# Patient Record
Sex: Female | Born: 1960 | Race: White | Hispanic: No | Marital: Single | Smoking: Never smoker
Health system: Southern US, Community
[De-identification: ages and names within clinical notes are randomized; demographics above are authoritative.]

## PROBLEM LIST (undated history)

## (undated) DIAGNOSIS — Z87442 Personal history of urinary calculi: Secondary | ICD-10-CM

## (undated) DIAGNOSIS — D649 Anemia, unspecified: Secondary | ICD-10-CM

## (undated) DIAGNOSIS — M5481 Occipital neuralgia: Secondary | ICD-10-CM

## (undated) DIAGNOSIS — M1990 Unspecified osteoarthritis, unspecified site: Secondary | ICD-10-CM

## (undated) DIAGNOSIS — Z8719 Personal history of other diseases of the digestive system: Secondary | ICD-10-CM

## (undated) DIAGNOSIS — T7840XA Allergy, unspecified, initial encounter: Secondary | ICD-10-CM

## (undated) DIAGNOSIS — F32A Depression, unspecified: Secondary | ICD-10-CM

## (undated) DIAGNOSIS — Z86718 Personal history of other venous thrombosis and embolism: Secondary | ICD-10-CM

## (undated) DIAGNOSIS — F419 Anxiety disorder, unspecified: Secondary | ICD-10-CM

## (undated) HISTORY — DX: Occipital neuralgia: M54.81

## (undated) HISTORY — DX: Depression, unspecified: F32.A

## (undated) HISTORY — DX: Anxiety disorder, unspecified: F41.9

## (undated) HISTORY — DX: Allergy, unspecified, initial encounter: T78.40XA

## (undated) HISTORY — PX: HERNIA REPAIR: SHX51

---

## 1999-02-15 HISTORY — PX: ESOPHAGOGASTRODUODENOSCOPY: SHX1529

## 2011-02-15 DIAGNOSIS — Z86718 Personal history of other venous thrombosis and embolism: Secondary | ICD-10-CM

## 2011-02-15 HISTORY — DX: Personal history of other venous thrombosis and embolism: Z86.718

## 2011-10-30 DIAGNOSIS — I609 Nontraumatic subarachnoid hemorrhage, unspecified: Secondary | ICD-10-CM | POA: Insufficient documentation

## 2011-10-30 DIAGNOSIS — Z8679 Personal history of other diseases of the circulatory system: Secondary | ICD-10-CM | POA: Insufficient documentation

## 2011-10-30 DIAGNOSIS — G08 Intracranial and intraspinal phlebitis and thrombophlebitis: Secondary | ICD-10-CM | POA: Insufficient documentation

## 2014-01-17 ENCOUNTER — Ambulatory Visit: Payer: Self-pay | Admitting: Cardiovascular Disease

## 2014-01-27 ENCOUNTER — Ambulatory Visit: Payer: Self-pay | Admitting: Cardiovascular Disease

## 2016-10-26 ENCOUNTER — Encounter: Payer: Self-pay | Admitting: *Deleted

## 2016-10-26 ENCOUNTER — Emergency Department
Admission: EM | Admit: 2016-10-26 | Discharge: 2016-10-26 | Disposition: A | Discharge status: Other Acute Inpatient Hospital | Payer: Self-pay | Attending: Emergency Medicine | Admitting: Emergency Medicine

## 2016-10-26 ENCOUNTER — Emergency Department: Payer: Self-pay

## 2016-10-26 DIAGNOSIS — G08 Intracranial and intraspinal phlebitis and thrombophlebitis: Secondary | ICD-10-CM | POA: Insufficient documentation

## 2016-10-26 HISTORY — DX: Personal history of other venous thrombosis and embolism: Z86.718

## 2016-10-26 LAB — HEPATIC FUNCTION PANEL
ALT: 19 U/L (ref 14–54)
AST: 24 U/L (ref 15–41)
Albumin: 4 g/dL (ref 3.5–5.0)
Alkaline Phosphatase: 75 U/L (ref 38–126)
TOTAL PROTEIN: 7.7 g/dL (ref 6.5–8.1)
Total Bilirubin: 0.9 mg/dL (ref 0.3–1.2)

## 2016-10-26 LAB — CBC WITH DIFFERENTIAL/PLATELET
BASOS ABS: 0 10*3/uL (ref 0–0.1)
BASOS PCT: 0 %
Eosinophils Absolute: 0.1 10*3/uL (ref 0–0.7)
Eosinophils Relative: 1 %
HEMATOCRIT: 29.6 % — AB (ref 35.0–47.0)
HEMOGLOBIN: 8.9 g/dL — AB (ref 12.0–16.0)
Lymphocytes Relative: 11 %
Lymphs Abs: 1.3 10*3/uL (ref 1.0–3.6)
MCH: 20.1 pg — ABNORMAL LOW (ref 26.0–34.0)
MCHC: 30 g/dL — ABNORMAL LOW (ref 32.0–36.0)
MCV: 66.9 fL — ABNORMAL LOW (ref 80.0–100.0)
Monocytes Absolute: 0.4 10*3/uL (ref 0.2–0.9)
Monocytes Relative: 3 %
NEUTROS ABS: 10.1 10*3/uL — AB (ref 1.4–6.5)
NEUTROS PCT: 85 %
Platelets: 375 10*3/uL (ref 150–440)
RBC: 4.43 MIL/uL (ref 3.80–5.20)
RDW: 20.8 % — AB (ref 11.5–14.5)
WBC: 12 10*3/uL — AB (ref 3.6–11.0)

## 2016-10-26 LAB — BASIC METABOLIC PANEL
Anion gap: 14 (ref 5–15)
BUN: 8 mg/dL (ref 6–20)
CALCIUM: 9.3 mg/dL (ref 8.9–10.3)
CO2: 22 mmol/L (ref 22–32)
Chloride: 104 mmol/L (ref 101–111)
Creatinine, Ser: 0.58 mg/dL (ref 0.44–1.00)
GFR calc Af Amer: >60 mL/min (ref >60)
GLUCOSE: 132 mg/dL — AB (ref 65–99)
Potassium: 4.1 mmol/L (ref 3.5–5.1)
SODIUM: 140 mmol/L (ref 135–145)

## 2016-10-26 LAB — APTT: APTT: 28 s (ref 24–36)

## 2016-10-26 LAB — PROTIME-INR
INR: 1.05
Prothrombin Time: 13.6 seconds (ref 11.4–15.2)

## 2016-10-26 MED ORDER — PROCHLORPERAZINE EDISYLATE 5 MG/ML IJ SOLN
10.0000 mg | INTRAMUSCULAR | Status: AC
  Administered 2016-10-26: 10 mg via INTRAVENOUS

## 2016-10-26 MED ORDER — HEPARIN (PORCINE) IN NACL 100-0.45 UNIT/ML-% IJ SOLN: 18.0000 [IU]/kg/h | Freq: Once | INTRAMUSCULAR | Status: DC

## 2016-10-26 MED ORDER — MORPHINE SULFATE (PF) 4 MG/ML IV SOLN
INTRAVENOUS | Status: AC
  Administered 2016-10-26: 4 mg via INTRAVENOUS
  Filled 2016-10-26: qty 1

## 2016-10-26 MED ORDER — ONDANSETRON HCL 4 MG/2ML IJ SOLN
INTRAMUSCULAR | Status: AC
  Administered 2016-10-26: 4 mg via INTRAVENOUS
  Filled 2016-10-26: qty 2

## 2016-10-26 MED ORDER — PROCHLORPERAZINE EDISYLATE 5 MG/ML IJ SOLN
INTRAMUSCULAR | Status: AC
  Administered 2016-10-26: 10 mg via INTRAVENOUS
  Filled 2016-10-26: qty 2

## 2016-10-26 MED ORDER — HEPARIN BOLUS VIA INFUSION
4000.0000 [IU] | Freq: Once | INTRAVENOUS | Status: AC
  Administered 2016-10-26: 4000 [IU] via INTRAVENOUS
  Filled 2016-10-26: qty 4000

## 2016-10-26 MED ORDER — HEPARIN SODIUM (PORCINE) 5000 UNIT/ML IJ SOLN
4000.0000 [IU] | Freq: Once | INTRAMUSCULAR | Status: DC
Start: 2016-10-26 — End: 2016-10-26

## 2016-10-26 MED ORDER — DIPHENHYDRAMINE HCL 50 MG/ML IJ SOLN
INTRAMUSCULAR | Status: AC
  Administered 2016-10-26: 12.5 mg via INTRAVENOUS
  Filled 2016-10-26: qty 1

## 2016-10-26 MED ORDER — MORPHINE SULFATE (PF) 4 MG/ML IV SOLN
4.0000 mg | Freq: Once | INTRAVENOUS | Status: AC
  Administered 2016-10-26: 4 mg via INTRAVENOUS
  Filled 2016-10-26: qty 1

## 2016-10-26 MED ORDER — HEPARIN (PORCINE) IN NACL 100-0.45 UNIT/ML-% IJ SOLN
1350.0000 [IU]/h | INTRAMUSCULAR | Status: DC
  Administered 2016-10-26: 1350 [IU]/h via INTRAVENOUS
  Filled 2016-10-26 (×2): qty 250

## 2016-10-26 MED ORDER — MORPHINE SULFATE (PF) 4 MG/ML IV SOLN
4.0000 mg | Freq: Once | INTRAVENOUS | Status: AC
  Administered 2016-10-26: 4 mg via INTRAVENOUS

## 2016-10-26 MED ORDER — IOPAMIDOL (ISOVUE-300) INJECTION 61%
75.0000 mL | Freq: Once | INTRAVENOUS | Status: AC | PRN
  Administered 2016-10-26: 75 mL via INTRAVENOUS
  Filled 2016-10-26: qty 75

## 2016-10-26 MED ORDER — ONDANSETRON HCL 4 MG/2ML IJ SOLN
4.0000 mg | INTRAMUSCULAR | Status: AC
  Administered 2016-10-26: 4 mg via INTRAVENOUS

## 2016-10-26 MED ORDER — DIPHENHYDRAMINE HCL 50 MG/ML IJ SOLN
12.5000 mg | INTRAMUSCULAR | Status: AC
  Administered 2016-10-26: 12.5 mg via INTRAVENOUS

## 2016-10-26 NOTE — Progress Notes (Signed)
ANTICOAGULATION CONSULT NOTE - Initial Consult  Pharmacy Consult for heparin dosing  Indication: VTE  Allergies  Allergen Reactions  . Codeine   . Shellfish Allergy     Patient Measurements: Height: 5\' 4"  (162.6 cm) Weight: 200 lb (90.7 kg) IBW/kg (Calculated) : 54.7 Heparin Dosing Weight: 75kg   Vital Signs: Temp: 97.9 F (36.6 C) (09/12 1718) Temp Source: Oral (09/12 1718) BP: 156/77 (09/12 1718) Pulse Rate: 78 (09/12 1718)  Labs:  Recent Labs  10/26/16 1721  HGB 8.9*  HCT 29.6*  PLT 375  CREATININE 0.58    Estimated Creatinine Clearance: 87.7 mL/min (by C-G formula based on SCr of 0.58 mg/dL).   Medical History: Past Medical History:  Diagnosis Date  . H/O cerebral venous sinus thrombosis     Assessment: Pharmacy consulted for heparin dosing and monitoring for VTE treatment in a 56 yo female   Goal of Therapy:  Heparin level 0.3-0.7 units/ml Monitor platelets by anticoagulation protocol: Yes   Plan:  Baseline labs ordered Patient dosing weight: 75kg  Give 4000 units bolus x 1 Start heparin infusion at 1350 units/hr Check anti-Xa level in 6 hours and daily while on heparin Continue to monitor H&H and platelets  Gardner CandleSheema M Janari Yamada, PharmD, BCPS Clinical Pharmacist 10/26/2016 7:43 PM

## 2016-10-26 NOTE — ED Notes (Signed)
Pt reports nausea has subsided and is requesting a Sprite. MD verbalized pt could have fluids. Sprite provided. Pt having no difficulty drinking at this time.

## 2016-10-26 NOTE — ED Notes (Signed)
Dr. Forbach at bedside.  

## 2016-10-26 NOTE — ED Triage Notes (Addendum)
States hx of sinus thrombosis, states headache, facial pressure and ear pressure, denies any tingling or numbness, states nausea, pt pale, awake and alert in no acute distress, states symptoms began sometime this AM

## 2016-10-26 NOTE — ED Notes (Signed)
Heparin stopped for transport d/t [facility].

## 2016-10-26 NOTE — ED Notes (Signed)
EMTALA checked for completion  

## 2016-10-26 NOTE — ED Provider Notes (Addendum)
[facility] Emergency Department Provider Note  ____________________________________________   First MD Initiated Contact with Patient 10/26/16 1810     (approximate)  I have reviewed the triage vital signs and the nursing notes.   HISTORY  Chief Complaint Facial Pain and Headache    HPI Jasmine [name] is a 56 y.o. female With a history of extensive venous sinus thromboses in 2013 who was on warfarin for 3 years who presents for evaluation of acute onset severe headache and facial pain and pressure starting today with nausea and photosensitivity similar to her prior symptoms.  She states that when this happened the first time her symptoms felt the same and she was transferred to [facility] and stayed for 11 days in the hospital.  They never establish why exactly she developed all the blood clots in her head but she improved on warfarin and was anticoagulated for 3 years.  She has been off of anticoagulation for about 2 years.  She has been in her usual state of health until this morning when she awoke with headache that is gotten gradually worse over the course of the morning.  Nothing in particular makes the patient's symptoms better nor worse.    The pain is severe and global throughout her head.  She denies any numbness, weakness, or tingling in any of her extremities.  She has not vomited and denies any recent fever.  Light makes the symptoms worse but she has had no visual changes.  She denies neck pain, neck stiffness, chest pain, shortness of breath, and abdominal pain.  She has had no recent traumatic injuries.  Of note, the patient was told to continue taking a daily aspirin when she had her last neurology follow-up visit in 2016, but she reports to me that she stopped taking it about 6 months ago because she did not feel she needed it anymore.  Past Medical History:  Diagnosis Date  . H/O cerebral venous sinus thrombosis     There are no active problems to display  for this patient.   History reviewed. No pertinent surgical history.  Prior to Admission medications   Not on File    Allergies Codeine and Shellfish allergy  No family history on file.  Social History Social History  Substance Use Topics  . Smoking status: Never Smoker  . Smokeless tobacco: Never Used  . Alcohol use No    Review of Systems Constitutional: No fever/chills Eyes: No visual changes.  +Photophobia ENT: No sore throat. Cardiovascular: Denies chest pain. Respiratory: Denies shortness of breath. Gastrointestinal: No abdominal pain.  No nausea, no vomiting.  No diarrhea.  No constipation. Genitourinary: Negative for dysuria. Musculoskeletal: Negative for neck pain.  Negative for back pain. Integumentary: Negative for rash. Neurological: Severe generalized headache and facial pain/pressure.  No focal numbness/weakness   ____________________________________________   PHYSICAL EXAM:  VITAL SIGNS: ED Triage Vitals  Enc Vitals Group     BP 10/26/16 1718 (!) 156/77     Pulse Rate 10/26/16 1718 78     Resp 10/26/16 1718 18     Temp 10/26/16 1718 97.9 F (36.6 C)     Temp Source 10/26/16 1718 Oral     SpO2 10/26/16 1718 100 %     Weight 10/26/16 1717 90.7 kg (200 lb)     Height 10/26/16 1717 1.626 m ( )     Head Circumference --      Peak Flow --      Pain Score 10/26/16  1717 8     Pain Loc --      Pain Edu? --      Excl. in GC? --     Constitutional: Alert and oriented. Appears uncomfortable Eyes: Conjunctivae are normal. PERRL. EOMI.   Head: Atraumatic. Nose: No congestion/rhinnorhea. Mouth/Throat: Mucous membranes are moist. Neck: No stridor.  No meningeal signs.   Cardiovascular: Normal rate, regular rhythm. Good peripheral circulation. Grossly normal heart sounds. Respiratory: Normal respiratory effort.  No retractions. Lungs CTAB. Gastrointestinal: Soft and nontender. No distention.  Musculoskeletal: No lower extremity tenderness nor  edema. No gross deformities of extremities. Neurologic:  Normal speech and language. No gross focal neurologic deficits are appreciated.  Skin:  Skin is warm, dry and intact. No rash noted. Psychiatric: Mood and affect are normal. Speech and behavior are normal.  ____________________________________________   LABS (all labs ordered are listed, but only abnormal results are displayed)  Labs Reviewed  BASIC METABOLIC PANEL - Abnormal; Notable for the following:       Result Value   Glucose, Bld 132 (*)    All other components within normal limits  HEPATIC FUNCTION PANEL - Abnormal; Notable for the following:    Bilirubin, Direct <0.1 (*)    All other components within normal limits  CBC WITH DIFFERENTIAL/PLATELET - Abnormal; Notable for the following:    WBC 12.0 (*)    Hemoglobin 8.9 (*)    HCT 29.6 (*)    MCV 66.9 (*)    MCH 20.1 (*)    MCHC 30.0 (*)    RDW 20.8 (*)    Neutro Abs 10.1 (*)    All other components within normal limits  PROTIME-INR  APTT  HEPARIN LEVEL (UNFRACTIONATED)   ____________________________________________  EKG  None - EKG not ordered by ED physician ____________________________________________  RADIOLOGY  Marylou Mccoy, personally discussed these images and results by phone with the on-call radiologist and used this discussion as part of my medical decision making.    Ct Head W & Wo Contrast  Addendum Date: 10/26/2016   ADDENDUM REPORT: 10/26/2016 19:29 ADDENDUM: Critical Value/emergent results were called by telephone at the time of interpretation on 10/26/2016 at 1925 hours to Dr. Loleta Rose , who verbally acknowledged these results. Electronically Signed   By: Odessa Fleming M.D.   On: 10/26/2016 19:29   Result Date: 10/26/2016 CLINICAL DATA:  56 year old female with history of severe dural venous sinus thrombosis in 2013 diagnosed and treated at [facility]. By report, residual clot remained in the right transverse and sigmoid sinuses on a 2014  follow-up. Stopped Coumadin 1-2 years ago. Acute onset headache symptoms and nausea which she reports are very similar to her 2013 presentation. EXAM: CT HEAD WITHOUT CONTRAST CT VENOGRAM WITH CONTRAST TECHNIQUE: Contiguous axial images were obtained from the base of the skull through the vertex without and with intravenous contrast CONTRAST:  75mL ISOVUE-300 IOPAMIDOL (ISOVUE-300) INJECTION 61% COMPARISON:  Report of [facility] Brain MRI and MRV 05/07/2012 and 10/30/2011 FINDINGS: CT HEAD Brain: Cerebral volume is normal. No midline shift, ventriculomegaly, mass effect, evidence of mass lesion, intracranial hemorrhage or evidence of cortically based acute infarction. Gray-white matter differentiation is within normal limits throughout the brain. Vascular: Abnormal vascular hyperdensity at the torcula and tracking along the left transverse and sigmoid sinuses. See below. Skull: Negative.  No acute osseous abnormality identified. Sinuses/Orbits: Clear. Other: Visualized orbits and scalp soft tissues are within normal limits. CTV HEAD Venous sinuses: Low-density filling defect within the torcula, and the medial  aspect of both transverse sinuses (series 4, image 28), although the right transverse and sigmoid sinuses appear to remain patent. Low-density thrombus throughout the left transverse and left sigmoid sinuses to the left IJ bulb, although the superior left IJ in the neck appears patent on series 4, image 1. The superior sagittal sinus, straight sinus, inferior sagittal sinus, vein of Galen, internal cerebral veins, and basal veins of Rosenthal remain patent. There is also preserved enhancement in the major cortical draining veins including the veins of Trolard. Delayed phase: No abnormal parenchymal enhancement identified. Review of the MIP images confirms the above findings IMPRESSION: 1. Positive for dural venous sinus thrombosis involving the torcula, left transverse sinus, left sigmoid sinus, and left IJ bulb. 2.  The remaining venous sinuses appear patent. 3. No acute intracranial hemorrhage. No intracranial mass effect. No CT evidence of infarct at this time. Electronically Signed: By: Odessa FlemingH  Hall M.D. On: 10/26/2016 19:24    ____________________________________________   PROCEDURES  Critical Care performed: Yes, see critical care procedure note(s)   Procedure(s) performed:   .Critical Care Performed by: Loleta RoseFORBACH, Mechille Varghese Authorized by: Loleta RoseFORBACH, Elzada Pytel   Critical care provider statement:    Critical care time (minutes):  45   Critical care time was exclusive of:  Separately billable procedures and treating other patients   Critical care was necessary to treat or prevent imminent or life-threatening deterioration of the following conditions:  CNS failure or compromise   Critical care was time spent personally by me on the following activities:  Development of treatment plan with patient or surrogate, discussions with consultants, evaluation of patient's response to treatment, examination of patient, obtaining history from patient or surrogate, ordering and performing treatments and interventions, ordering and review of laboratory studies, ordering and review of radiographic studies, pulse oximetry, re-evaluation of patient's condition and review of old charts      ____________________________________________   INITIAL IMPRESSION / ASSESSMENT AND PLAN / ED COURSE  Pertinent labs & imaging results that were available during my care of the patient were reviewed by me and considered in my medical decision making (see chart for details).     Clinical Course as of Oct 27 2307  Wed Oct 26, 2016  1828 I reviewed the patient's history from 2013 and the medical record and she did have extremely extensive venous sinus thromboses throughout most, if not all, of her sinuses.  additionally, they do not know why she developed these because all of her hypercoagulability studies were normal.  She was  anticoagulated on warfarin for 3 years and then taken off.  A follow-up MRI/MRA in 2014 showed marked improvement with only a slight bit of residual, non-occlusive thrombosis in a limited portion of the sinuses.  Given the acute onset of symptoms that feels just like her prior symptoms,  I called and spoke with phone with Dr. Margo AyeHall, a [facility]adi[facility].  We discussed the appropriate imaging study and he recommended CT head with and without IV contrast, with the IV phase being the CTV protocol. he is going to call and speak with the CT technologist to let them know exactly how to perform the study.  I will provide morphine and Zofran to the patient for comfort in the interim and once we receive the results we can decide if she needs transfer or additional treatment here in this emergency department.  [CF]  1926 Dr. Margo AyeHall called and let me know that the patient definitely has a recurrence of acute venous sinus  thrombosis.  I will inform the patient, begin anticoagulation with IV heparin, and contact [facility] for transfer.  [CF]  1930 CT HEAD W & WO CONTRAST [CF]  1935 Informed patient of the results, she confirmed that [facility] is her preference if possible.  Contacting transfer center.  [CF]  2056 I had a phone conversation with Dr. Modesto Charon (possibly spelled Renaldo Reel).  he had reviewed the patient's chart prior to our discussion.  I did not have an opportunity to pass along much of the information about the patient's acute symptoms and visit today.  He stated that he felt it would be very unlikely for the patient to have a recurrence of the venous sinus thrombosis and recommended against anticoagulation until she has the MR studies.  He felt that the most appropriate care would be if we transfer her to the [facility] emergency Department for further evaluation and consultation by the neurology service.  He did acknowledge that as she is currently my patient I should treat her or not treat her as I see fit.  Given that  Dr. Margo Aye, the neuroradiologist who interpreted the CT scans, told me on the phone that he has no doubt that this is acute, and that her symptoms feel the same to her now as they did an 2013 when she first developed venous sinus thromboses, I feel it is much better to treat her empirically rather than risk delay of care and delay of treatment.  Again, given her presentation and the findings from the CT scans which Dr. Margo Aye felt were equivocally acute, I still feel that she is best served by transfer to [facility] for further evaluation and treatment.  I updated the patient with the neurologist's recommendations and explained why I am choosing to proceed with treatment.  She understands and agrees.  We are now arranging transportation.  [CF]  2203 Reassessed patient.  She is less nauseated after the Compazine.  She has tolerated some liquids.  No obvious side effects from heparin.  Still awaiting transportation.  [CF]    Clinical Course User Index [CF] Loleta Rose, MD    ____________________________________________  FINAL CLINICAL IMPRESSION(S) / ED DIAGNOSES  Final diagnoses:  Acute cerebral venous sinus thrombosis     MEDICATIONS GIVEN DURING THIS VISIT:  Medications  heparin bolus via infusion 4,000 Units (4,000 Units Intravenous Bolus from Bag 10/26/16 1953)    Followed by  heparin ADULT infusion 100 units/mL (25000 units/234mL sodium chloride 0.45%) (0 Units/hr Intravenous Stopped 10/26/16 2222)  morphine 4 MG/ML injection 4 mg (4 mg Intravenous Given 10/26/16 1844)  ondansetron (ZOFRAN) injection 4 mg (4 mg Intravenous Given 10/26/16 1844)  iopamidol (ISOVUE-300) 61 % injection 75 mL (75 mLs Intravenous Contrast Given 10/26/16 1851)  morphine 4 MG/ML injection 4 mg (4 mg Intravenous Given 10/26/16 1953)  prochlorperazine (COMPAZINE) injection 10 mg (10 mg Intravenous Given 10/26/16 2030)  diphenhydrAMINE (BENADRYL) injection 12.5 mg (12.5 mg Intravenous Given 10/26/16 2030)     NEW OUTPATIENT  MEDICATIONS STARTED DURING THIS VISIT:  There are no discharge medications for this patient.   There are no discharge medications for this patient.   There are no discharge medications for this patient.    Note:  This document was prepared using Dragon voice recognition software and may include unintentional dictation errors.    Loleta Rose, MD 10/26/16 2101    Loleta Rose, MD 10/26/16 [phone]

## 2016-10-26 NOTE — ED Notes (Signed)
Pt ambulatory to and from toilet by self.  

## 2016-10-26 NOTE — ED Notes (Signed)
Pt ambulatory to toilet

## 2018-06-30 ENCOUNTER — Emergency Department: Payer: Self-pay

## 2018-06-30 ENCOUNTER — Encounter: Payer: Self-pay | Admitting: Internal Medicine

## 2018-06-30 ENCOUNTER — Other Ambulatory Visit: Payer: Self-pay

## 2018-06-30 ENCOUNTER — Inpatient Hospital Stay
Admission: EM | Admit: 2018-06-30 | Discharge: 2018-07-04 | DRG: 378 | Disposition: A | Discharge status: Home / Self Care | Payer: Self-pay | Attending: Internal Medicine | Admitting: Internal Medicine

## 2018-06-30 DIAGNOSIS — R109 Unspecified abdominal pain: Secondary | ICD-10-CM

## 2018-06-30 DIAGNOSIS — K922 Gastrointestinal hemorrhage, unspecified: Principal | ICD-10-CM | POA: Diagnosis present

## 2018-06-30 DIAGNOSIS — D649 Anemia, unspecified: Secondary | ICD-10-CM | POA: Diagnosis present

## 2018-06-30 DIAGNOSIS — R791 Abnormal coagulation profile: Secondary | ICD-10-CM | POA: Diagnosis present

## 2018-06-30 DIAGNOSIS — Z91013 Allergy to seafood: Secondary | ICD-10-CM

## 2018-06-30 DIAGNOSIS — K449 Diaphragmatic hernia without obstruction or gangrene: Secondary | ICD-10-CM | POA: Diagnosis present

## 2018-06-30 DIAGNOSIS — Z1159 Encounter for screening for other viral diseases: Secondary | ICD-10-CM

## 2018-06-30 DIAGNOSIS — E669 Obesity, unspecified: Secondary | ICD-10-CM | POA: Diagnosis present

## 2018-06-30 DIAGNOSIS — Z885 Allergy status to narcotic agent status: Secondary | ICD-10-CM

## 2018-06-30 DIAGNOSIS — Z7901 Long term (current) use of anticoagulants: Secondary | ICD-10-CM

## 2018-06-30 DIAGNOSIS — D62 Acute posthemorrhagic anemia: Secondary | ICD-10-CM | POA: Diagnosis present

## 2018-06-30 DIAGNOSIS — D509 Iron deficiency anemia, unspecified: Secondary | ICD-10-CM | POA: Diagnosis present

## 2018-06-30 DIAGNOSIS — K219 Gastro-esophageal reflux disease without esophagitis: Secondary | ICD-10-CM | POA: Diagnosis present

## 2018-06-30 DIAGNOSIS — E539 Vitamin B deficiency, unspecified: Secondary | ICD-10-CM | POA: Diagnosis present

## 2018-06-30 LAB — URINALYSIS, COMPLETE (UACMP) WITH MICROSCOPIC
Bacteria, UA: NONE SEEN
Bilirubin Urine: NEGATIVE
Glucose, UA: NEGATIVE mg/dL
Hgb urine dipstick: NEGATIVE
Ketones, ur: 5 mg/dL — AB
Leukocytes,Ua: NEGATIVE
Nitrite: NEGATIVE
Protein, ur: 100 mg/dL — AB
Specific Gravity, Urine: 1.017 (ref 1.005–1.030)
pH: 7 (ref 5.0–8.0)

## 2018-06-30 LAB — COMPREHENSIVE METABOLIC PANEL
ALT: 12 U/L (ref 0–44)
AST: 17 U/L (ref 15–41)
Albumin: 4.2 g/dL (ref 3.5–5.0)
Alkaline Phosphatase: 71 U/L (ref 38–126)
Anion gap: 10 (ref 5–15)
BUN: 11 mg/dL (ref 6–20)
CO2: 25 mmol/L (ref 22–32)
Calcium: 9 mg/dL (ref 8.9–10.3)
Chloride: 108 mmol/L (ref 98–111)
Creatinine, Ser: 0.6 mg/dL (ref 0.44–1.00)
GFR calc Af Amer: >60 mL/min (ref >60)
GFR calc non Af Amer: >60 mL/min (ref >60)
Glucose, Bld: 131 mg/dL — ABNORMAL HIGH (ref 70–99)
Potassium: 3.6 mmol/L (ref 3.5–5.1)
Sodium: 143 mmol/L (ref 135–145)
Total Bilirubin: 1.1 mg/dL (ref 0.3–1.2)
Total Protein: 7.5 g/dL (ref 6.5–8.1)

## 2018-06-30 LAB — RETICULOCYTES
Immature Retic Fract: 26.5 % — ABNORMAL HIGH (ref 2.3–15.9)
RBC.: 3.7 MIL/uL — ABNORMAL LOW (ref 3.87–5.11)
Retic Count, Absolute: 86.6 10*3/uL (ref 19.0–186.0)
Retic Ct Pct: 2.3 % (ref 0.4–3.1)

## 2018-06-30 LAB — TROPONIN I: Troponin I: <0.03 ng/mL (ref <0.03)

## 2018-06-30 LAB — CBC
HCT: 25.3 % — ABNORMAL LOW (ref 36.0–46.0)
Hemoglobin: 6.5 g/dL — ABNORMAL LOW (ref 12.0–15.0)
MCH: 17.7 pg — ABNORMAL LOW (ref 26.0–34.0)
MCHC: 25.7 g/dL — ABNORMAL LOW (ref 30.0–36.0)
MCV: 68.9 fL — ABNORMAL LOW (ref 80.0–100.0)
Platelets: 369 10*3/uL (ref 150–400)
RBC: 3.67 MIL/uL — ABNORMAL LOW (ref 3.87–5.11)
RDW: 21.5 % — ABNORMAL HIGH (ref 11.5–15.5)
WBC: 9.8 10*3/uL (ref 4.0–10.5)
nRBC: 0 % (ref 0.0–0.2)

## 2018-06-30 LAB — PROTIME-INR
INR: 2.1 — ABNORMAL HIGH (ref 0.8–1.2)
Prothrombin Time: 23.5 seconds — ABNORMAL HIGH (ref 11.4–15.2)

## 2018-06-30 LAB — APTT: aPTT: 36 seconds (ref 24–36)

## 2018-06-30 LAB — ABO/RH: ABO/RH(D): A POS

## 2018-06-30 LAB — PREPARE RBC (CROSSMATCH)

## 2018-06-30 LAB — LIPASE, BLOOD: Lipase: 41 U/L (ref 11–51)

## 2018-06-30 LAB — HEMOGLOBIN AND HEMATOCRIT, BLOOD
HCT: 27.8 % — ABNORMAL LOW (ref 36.0–46.0)
Hemoglobin: 7.4 g/dL — ABNORMAL LOW (ref 12.0–15.0)

## 2018-06-30 LAB — SARS CORONAVIRUS 2 BY RT PCR (HOSPITAL ORDER, PERFORMED IN ~~LOC~~ HOSPITAL LAB): SARS Coronavirus 2: NEGATIVE

## 2018-06-30 MED ORDER — ONDANSETRON HCL 4 MG/2ML IJ SOLN: 4.0000 mg | Freq: Four times a day (QID) | INTRAMUSCULAR | Status: DC | PRN

## 2018-06-30 MED ORDER — ACETAMINOPHEN 325 MG PO TABS: 650.0000 mg | ORAL_TABLET | Freq: Four times a day (QID) | ORAL | Status: DC | PRN

## 2018-06-30 MED ORDER — PANTOPRAZOLE SODIUM 40 MG IV SOLR
40.0000 mg | Freq: Two times a day (BID) | INTRAVENOUS | Status: DC
  Administered 2018-06-30 – 2018-07-02 (×4): 40 mg via INTRAVENOUS
  Filled 2018-06-30 (×4): qty 40

## 2018-06-30 MED ORDER — SODIUM CHLORIDE 0.9% IV SOLUTION
Freq: Once | INTRAVENOUS | Status: AC
  Administered 2018-06-30: 250 mL via INTRAVENOUS
  Filled 2018-06-30: qty 250

## 2018-06-30 MED ORDER — HYDRALAZINE HCL 20 MG/ML IJ SOLN
10.0000 mg | INTRAMUSCULAR | Status: DC | PRN
  Filled 2018-06-30: qty 1

## 2018-06-30 MED ORDER — ONDANSETRON HCL 4 MG PO TABS: 4.0000 mg | ORAL_TABLET | Freq: Four times a day (QID) | ORAL | Status: DC | PRN

## 2018-06-30 MED ORDER — SODIUM CHLORIDE 0.9 % IV SOLN
INTRAVENOUS | Status: DC
  Administered 2018-06-30: 23:00:00 via INTRAVENOUS

## 2018-06-30 MED ORDER — SODIUM CHLORIDE 0.9% FLUSH: 3.0000 mL | Freq: Once | INTRAVENOUS | Status: DC

## 2018-06-30 MED ORDER — ACETAMINOPHEN 650 MG RE SUPP: 650.0000 mg | Freq: Four times a day (QID) | RECTAL | Status: DC | PRN

## 2018-06-30 NOTE — ED Notes (Signed)
Patient transported to Ultrasound 

## 2018-06-30 NOTE — ED Notes (Signed)
ED TO INPATIENT HANDOFF REPORT  ED Nurse Name and Phone #: Shanda Bumps 83  S Name/Age/Gender [name] 58 y.o. female Room/Bed: ED09A/ED09A  Code Status   Code Status: Not on file  Home/SNF/Other Home Patient oriented to: self, place, time and situation Is this baseline? Yes   Triage Complete: Triage complete  Chief Complaint vomiting  Triage Note Pt comes from home via EMS with upper middle abdominal pain radiating to her left shoulder. Pt has hx of gallbladder attacks and describes it as such saying that normally she can make them go away but hasn't been able to today. Pt vomitted with EMS. Pt given  Zofran IM. Pt on blood thinner for hx of stroke.    Allergies Allergies  Allergen Reactions  . Codeine   . Shellfish Allergy     Level of Care/Admitting Diagnosis ED Disposition    ED Disposition Condition Comment   Admit  Hospital Area: [facility] [[id number]]  Level of Care: Med-Surg [16]  Covid Evaluation: N/A  Diagnosis: Anemia [161096]  Admitting Physician: Enid Baas [[id number]]  Attending Physician: Enid Baas [[id number]]  Estimated length of stay: past midnight tomorrow  Certification:: I certify this patient will need inpatient services for at least 2 midnights  PT Class (Do Not Modify): Inpatient [101]  PT Acc Code (Do Not Modify): Private [1]       B Medical/Surgery History Past Medical History:  Diagnosis Date  . H/O cerebral venous sinus thrombosis 2013   No past surgical history on file.   A IV Location/Drains/Wounds Patient Lines/Drains/Airways Status   Active Line/Drains/Airways    Name:   Placement date:   Placement time:   Site:   Days:   Peripheral IV 10/26/16 Right Antecubital   10/26/16    1843    Antecubital   612   Peripheral IV 10/26/16 Left Antecubital   10/26/16    1948    Antecubital   612   Peripheral IV 06/30/18 Left Antecubital   06/30/18    1602    Antecubital   less than 1   Peripheral IV  06/30/18 Right Wrist   06/30/18    1940    Wrist   less than 1          Intake/Output Last 24 hours No intake or output data in the 24 hours ending 06/30/18 2021  Labs/Imaging Results for orders placed or performed during the hospital encounter of 06/30/18 (from the past 48 hour(s))  Lipase, blood     Status: None   Collection Time: 06/30/18  4:00 PM  Result Value Ref Range   Lipase 41 11 - 51 U/L    Comment: Performed at [facility], [address] [place], [place] [address]  Comprehensive metabolic panel     Status: Abnormal   Collection Time: 06/30/18  4:00 PM  Result Value Ref Range   Sodium 143 135 - 145 mmol/L   Potassium 3.6 3.5 - 5.1 mmol/L   Chloride 108 98 - 111 mmol/L   CO2 25 22 - 32 mmol/L   Glucose, Bld 131 (H) 70 - 99 mg/dL   BUN 11 6 - 20 mg/dL   Creatinine, Ser 6.21 0.44 - 1.00 mg/dL   Calcium 9.0 8.9 - 30.8 mg/dL   Total Protein 7.5 6.5 - 8.1 g/dL   Albumin 4.2 3.5 - 5.0 g/dL   AST 17 15 - 41 U/L   ALT 12 0 - 44 U/L   Alkaline Phosphatase 71 38 -  126 U/L   Total Bilirubin 1.1 0.3 - 1.2 mg/dL   GFR calc non Af Amer >60 >60 mL/min   GFR calc Af Amer >60 >60 mL/min   Anion gap 10 5 - 15    Comment: Performed at [facility], [address] [place], [place] [address]  CBC     Status: Abnormal   Collection Time: 06/30/18  4:00 PM  Result Value Ref Range   WBC 9.8 4.0 - 10.5 K/uL   RBC 3.67 (L) 3.87 - 5.11 MIL/uL   Hemoglobin 6.5 (L) 12.0 - 15.0 g/dL    Comment: Reticulocyte Hemoglobin testing may be clinically indicated, consider ordering this additional test [id number]    HCT 25.3 (L) 36.0 - 46.0 %   MCV 68.9 (L) 80.0 - 100.0 fL   MCH 17.7 (L) 26.0 - 34.0 pg   MCHC 25.7 (L) 30.0 - 36.0 g/dL   RDW 11.9 (H) 14.7 - 82.9 %   Platelets 369 150 - 400 K/uL   nRBC 0.0 0.0 - 0.2 %    Comment: Performed at [facility] [address] [place], [place] [address]  Reticulocytes     Status: Abnormal   Collection Time: 06/30/18   4:00 PM  Result Value Ref Range   Retic Ct Pct 2.3 0.4 - 3.1 %   RBC. 3.70 (L) 3.87 - 5.11 MIL/uL   Retic Count, Absolute 86.6 19.0 - 186.0 K/uL   Immature Retic Fract 26.5 (H) 2.3 - 15.9 %    Comment: Performed at [facility], [address], [place], [place] [address]  ABO/Rh     Status: None   Collection Time: 06/30/18  4:00 PM  Result Value Ref Range   ABO/RH(D)      A POS Performed at [facility], [address] [place], [place] [address]   Troponin I - Add-On to previous collection     Status: None   Collection Time: 06/30/18  4:24 PM  Result Value Ref Range   Troponin I <0.03 <0.03 ng/mL    Comment: Performed at [facility], [address] [place], [place] [address]  Urinalysis, Complete w Microscopic     Status: Abnormal   Collection Time: 06/30/18  4:49 PM  Result Value Ref Range   Color, Urine YELLOW (A) YELLOW   APPearance CLEAR (A) CLEAR   Specific Gravity, Urine 1.017 1.005 - 1.030   pH 7.0 5.0 - 8.0   Glucose, UA NEGATIVE NEGATIVE mg/dL   Hgb urine dipstick NEGATIVE NEGATIVE   Bilirubin Urine NEGATIVE NEGATIVE   Ketones, ur 5 (A) NEGATIVE mg/dL   Protein, ur 413 (A) NEGATIVE mg/dL   Nitrite NEGATIVE NEGATIVE   Leukocytes,Ua NEGATIVE NEGATIVE   RBC / HPF 0-5 0 - 5 RBC/hpf   WBC, UA 0-5 0 - 5 WBC/hpf   Bacteria, UA NONE SEEN NONE SEEN   Squamous Epithelial / LPF 6-10 0 - 5   Mucus PRESENT     Comment: Performed at [facility], [address] [place], [place] [address]  Type and screen [facility]     Status: None (Preliminary result)   Collection Time: 06/30/18  4:49 PM  Result Value Ref Range   ABO/RH(D) A POS    Antibody Screen NEG    Sample Expiration 07/03/2018,2359    Unit Number [id number]    Blood Component Type RED CELLS,LR    Unit division 00    Status of Unit ISSUED  Transfusion Status OK TO TRANSFUSE    Crossmatch Result      Compatible Performed at [facility], [address] [place], [place] [address]   Protime-INR     Status: Abnormal   Collection Time: 06/30/18  4:49 PM  Result Value Ref Range   Prothrombin Time 23.5 (H) 11.4 - 15.2 seconds   INR 2.1 (H) 0.8 - 1.2    Comment: (NOTE) INR goal varies based on device and disease states. Performed at [facility], [address] [place], [place] [address]   APTT     Status: None   Collection Time: 06/30/18  4:49 PM  Result Value Ref Range   aPTT 36 24 - 36 seconds    Comment: Performed at [facility], [address] [place], [place] [address]  SARS Coronavirus 2 (CEPHEID - Performed in [facility] hospital lab), Hosp Order     Status: None   Collection Time: 06/30/18  7:06 PM  Result Value Ref Range   SARS Coronavirus 2 NEGATIVE NEGATIVE    Comment: (NOTE) If result is NEGATIVE SARS-CoV-2 target nucleic acids are NOT DETECTED. The SARS-CoV-2 RNA is generally detectable in upper and lower  respiratory specimens during the acute phase of infection. The lowest  concentration of SARS-CoV-2 viral copies this assay can detect is 250  copies / mL. A negative result does not preclude SARS-CoV-2 infection  and should not be used as the sole basis for treatment or other  patient management decisions.  A negative result may occur with  improper specimen collection / handling, submission of specimen other  than nasopharyngeal swab, presence of viral mutation(s) within the  areas targeted by this assay, and inadequate number of viral copies  (<250 copies / mL). A negative result must be combined with clinical  observations, patient history, and epidemiological information. If result is POSITIVE SARS-CoV-2 target nucleic acids are DETECTED. The SARS-CoV-2 RNA is generally detectable in upper and lower  respiratory specimens dur ing the acute phase of infection.  Positive  results are indicative of active infection with SARS-CoV-2.  Clinical  correlation with patient  history and other diagnostic information is  necessary to determine patient infection status.  Positive results do  not rule out bacterial infection or co-infection with other viruses. If result is PRESUMPTIVE POSTIVE SARS-CoV-2 nucleic acids MAY BE PRESENT.   A presumptive positive result was obtained on the submitted specimen  and confirmed on repeat testing.  While 2019 novel coronavirus  (SARS-CoV-2) nucleic acids may be present in the submitted sample  additional confirmatory testing may be necessary for epidemiological  and / or clinical management purposes  to differentiate between  SARS-CoV-2 and other Sarbecovirus currently known to infect humans.  If clinically indicated additional testing with an alternate test  methodology [phone]) is advised. The SARS-CoV-2 RNA is generally  detectable in upper and lower respiratory sp ecimens during the acute  phase of infection. The expected result is Negative. Fact Sheet for Patients:  BoilerBrush.com.cy Fact Sheet for Healthcare Providers: https://pope.com/ This test is not yet approved or cleared by the [place] FDA and has been authorized for detection and/or diagnosis of SARS-CoV-2 by FDA under an Emergency Use Authorization (EUA).  This EUA will remain in effect (meaning this test can be used) for the duration of the COVID-19 declaration under Section 564(b)(1) of the Act, 21 U.S.C. section 360bbb-3(b)(1), unless the authorization is terminated or revoked sooner. Performed at [facility], [address] [place], [place] [address]   Prepare RBC     Status: None   Collection Time: 06/30/18  7:30 PM  Result Value Ref Range   Order Confirmation      ORDER PROCESSED BY BLOOD BANK Performed at [facility], [address] [place], [place] [address]    [place] Abdomen Limited Ruq  Result Date: 06/30/2018 CLINICAL DATA:  Acute onset of RIGHT UPPER QUADRANT  abdominal pain associated with nausea that began approximately 2 hours prior to emergency department arrival. EXAM: ULTRASOUND ABDOMEN LIMITED RIGHT UPPER QUADRANT COMPARISON:  None. FINDINGS: Gallbladder: No shadowing gallstones or echogenic sludge. No gallbladder wall thickening or pericholecystic fluid. Negative sonographic Murphy sign according to the ultrasound technologist. Common bile duct: Diameter: Approximately 3 mm. Liver: Normal size and echotexture without focal parenchymal abnormality. Portal vein is patent on color Doppler imaging with normal direction of blood flow towards the liver. IMPRESSION: Normal examination. Electronically Signed   By: Hulan Saashomas  Lawrence M.D.   On: 06/30/2018 17:25    Pending Labs Unresulted Labs (From admission, onward)    Start     Ordered   06/30/18 2016  Vitamin B12  (Anemia Panel (PNL))  Once,   STAT     06/30/18 2015   06/30/18 2016  Folate  (Anemia Panel (PNL))  Once,   STAT     06/30/18 2015   06/30/18 2016  Iron and TIBC  (Anemia Panel (PNL))  Once,   STAT     06/30/18 2015   06/30/18 2016  Ferritin  (Anemia Panel (PNL))  Once,   STAT     06/30/18 2015   Signed and Held  SARS Coronavirus 2 (CEPHEID - Performed in [facility]), [facility] Order  Once,   R    Comments:  No isolation needed for this testing (if isolation ordered for another indication, maintain current isolation).   Question:  Pre-procedural testing  Answer:  Yes   Signed and Held   Signed and Held  HIV antibody (Routine Testing)  Once,   R     Signed and Held   Signed and Held  Basic metabolic panel  Tomorrow morning,   R     Signed and Held   Signed and Held  CBC  Tomorrow morning,   R     Signed and Held   Signed and Held  Protime-INR  Tomorrow morning,   R     Signed and Held          Vitals/Pain Today's Vitals   06/30/18 1909 06/30/18 1926 06/30/18 1942 06/30/18 1945  BP: (!) 174/67 (!) 174/67 (!) 162/72 (!) 162/72  Pulse: 76 73 78 80  Resp: 13 15 18 15    Temp:  98.1 F (36.7 C) 98 F (36.7 C)   TempSrc:  Oral Oral   SpO2: 100% 98% 99% 100%  Weight:      Height:      PainSc:        Isolation Precautions No active isolations  Medications Medications  sodium chloride flush (NS) 0.9 % injection 3 mL (has no administration in time range)  0.9 %  sodium chloride infusion (Manually program via Guardrails IV Fluids) (250 mLs Intravenous New Bag/Given 06/30/18 1936)    Mobility walks Low fall risk   Focused Assessments Cardiac Assessment Handoff:  Cardiac Rhythm: Normal sinus rhythm Lab Results  Component Value Date   TROPONINI <0.03 06/30/2018   No results found for: DDIMER Does the Patient currently have  chest pain? No     R Recommendations: See Admitting Provider Note  Report given to:   Additional Notes:

## 2018-06-30 NOTE — ED Notes (Signed)
Admitting at bedside 

## 2018-06-30 NOTE — ED Provider Notes (Signed)
-----------------------------------------   6:19 PM on 06/30/2018 -----------------------------------------  Patient with a history of chronic anemia patient seen and evaluated during the coronavirus epidemic during a time with low staffing, on blood thinners because of a history of thrombosis, has a long history she states of epigastric abdominal discomfort associated with food which gets better with burping.  Happen again today although today she threw up instead of burping.  She now feels 100% better when I saw her her belly was completely benign, I talked to her about a CT scan and further evaluation and she refuses she states she feels "great" and she is embarrassed to have been here.  She states this is the same thing that "always happens to her".  She has not seen any other doctors for this.  The onset was several hours prior to coming in and when she finally vomited nonbloody nonbilious vomiting x1, she states all of her symptoms resolved and she has no ongoing complaints.  She wants to eat and leave.  I talked her at length about her anemia, she has not been symptomatic with that she has had no melena no bright red blood per rectum, she has not had her blood checked in at least 2 years.  This appears to be chronic, we will check rectal exam to ensure that she is not bleeding.  Her abdomen is completely benign and she is asymptomatic.  We did talk about admission to the hospital but she refuses we talked about imaging and she refuses she states she feels "great" and wants to go home.  Her belly is benign.  There is little to suggest ACS PE or dissection, there is little to suggest SBO, we will ensure that she can tolerate p.o. prior to departure and her belly is again nontender with no guarding and no rebound.  Extensive return precautions and follow-up given and understood.  Work including troponin are reassuring.  Patient declines to wait for serial enzymes.   Jeanmarie Plant, MD 06/30/18 [phone]

## 2018-06-30 NOTE — ED Triage Notes (Signed)
Pt comes from home via EMS with upper middle abdominal pain radiating to her left shoulder. Pt has hx of gallbladder attacks and describes it as such saying that normally she can make them go away but hasn't been able to today. Pt vomitted with EMS. Pt given 4mg  Zofran IM. Pt on blood thinner for hx of stroke.

## 2018-06-30 NOTE — Plan of Care (Signed)
  Problem: Education: Goal: Knowledge of General Education information will improve Description: Including pain rating scale, medication(s)/side effects and non-pharmacologic comfort measures Outcome: Progressing   Problem: Clinical Measurements: Goal: Diagnostic test results will improve Outcome: Progressing   

## 2018-06-30 NOTE — ED Provider Notes (Signed)
[facility] Emergency Department Provider Note  ____________________________________________  Time seen: Approximately 4:24 PM  I have reviewed the triage vital signs and the nursing notes.   HISTORY  Chief Complaint Nausea and Abdominal Pain    HPI Jasmine [name] is a 58 y.o. female that presents to the emergency department for evaluation of mid abdominal pressure since this morning.  Patient states that she was sitting down to eat a meal when she developed abdominal pressure.  Pain radiated to her left shoulder.  She had an episode of bilious vomiting when she arrived in the emergency department.  After vomiting in the emergency department, symptoms mostly resolved.  She has had similar abdominal pain previously, which usually resolves with burping.  She had a small amount of diarrhea this morning.  She has never had any abdominal surgery.  No recent URI.  Patient drinks a couple alcoholic beverages per day.  Patient takes Xarelto.  No fever, chills, shortness of breath, nausea.   Past Medical History:  Diagnosis Date  . H/O cerebral venous sinus thrombosis 2013    Patient Active Problem List   Diagnosis Date Noted  . Anemia 06/30/2018    No past surgical history on file.  Prior to Admission medications   Medication Sig Start Date End Date Taking? Authorizing Provider  rivaroxaban (XARELTO) 20 MG TABS tablet Take 20 mg by mouth every evening.    Yes [provider]    Allergies Codeine and Shellfish allergy  Family History  Problem Relation Age of Onset  . Breast cancer Mother   . Bone cancer Mother     Social History Social History   Tobacco Use  . Smoking status: Never Smoker  . Smokeless tobacco: Never Used  Substance Use Topics  . Alcohol use: No  . Drug use: Not on file     Review of Systems  Constitutional: No fever/chills Cardiovascular: No chest pain. Respiratory: No SOB. Gastrointestinal: Positive for abdominal  pressure.  No nausea.  Positive for vomiting x1. Musculoskeletal: Negative for musculoskeletal pain. Skin: Negative for rash, abrasions, lacerations, ecchymosis. Neurological: Negative for headaches, numbness or tingling   ____________________________________________   PHYSICAL EXAM:  VITAL SIGNS: ED Triage Vitals  Enc Vitals Group     BP 06/30/18 1601 (!) 160/74     Pulse Rate 06/30/18 1601 68     Resp 06/30/18 1601 (!) 22     Temp 06/30/18 1601 98 F (36.7 C)     Temp Source 06/30/18 1601 Oral     SpO2 06/30/18 1601 100 %     Weight 06/30/18 1558 200 lb (90.7 kg)     Height 06/30/18 1558 5\' 3"  (1.6 m)     Head Circumference --      Peak Flow --      Pain Score 06/30/18 1558 8     Pain Loc --      Pain Edu? --      Excl. in GC? --      Constitutional: Alert and oriented. Well appearing and in no acute distress. Eyes: Conjunctivae are normal. PERRL. EOMI. Head: Atraumatic. ENT:      Ears:      Nose: No congestion/rhinnorhea.      Mouth/Throat: Mucous membranes are moist.  Neck: No stridor.   Cardiovascular: Normal rate, regular rhythm.  Good peripheral circulation. Respiratory: Normal respiratory effort without tachypnea or retractions. Lungs CTAB. Good air entry to the bases with no decreased or absent breath sounds. Gastrointestinal: Bowel sounds  4 quadrants. Soft and nontender to palpation. No guarding or rigidity. No palpable masses. No distention.  Musculoskeletal: Full range of motion to all extremities. No gross deformities appreciated. Neurologic:  Normal speech and language. No gross focal neurologic deficits are appreciated.  Skin:  Skin is warm, dry and intact. No rash noted. Psychiatric: Mood and affect are normal. Speech and behavior are normal. Patient exhibits appropriate insight and judgement.   ____________________________________________   LABS (all labs ordered are listed, but only abnormal results are displayed)  Labs Reviewed  COMPREHENSIVE  METABOLIC PANEL - Abnormal; Notable for the following components:      Result Value   Glucose, Bld 131 (*)    All other components within normal limits  CBC - Abnormal; Notable for the following components:   RBC 3.67 (*)    Hemoglobin 6.5 (*)    HCT 25.3 (*)    MCV 68.9 (*)    MCH 17.7 (*)    MCHC 25.7 (*)    RDW 21.5 (*)    All other components within normal limits  URINALYSIS, COMPLETE (UACMP) WITH MICROSCOPIC - Abnormal; Notable for the following components:   Color, Urine YELLOW (*)    APPearance CLEAR (*)    Ketones, ur 5 (*)    Protein, ur 100 (*)    All other components within normal limits  PROTIME-INR - Abnormal; Notable for the following components:   Prothrombin Time 23.5 (*)    INR 2.1 (*)    All other components within normal limits  RETICULOCYTES - Abnormal; Notable for the following components:   RBC. 3.70 (*)    Immature Retic Fract 26.5 (*)    All other components within normal limits  SARS CORONAVIRUS 2 (HOSPITAL ORDER, PERFORMED IN [facility])  LIPASE, BLOOD  TROPONIN I  APTT  VITAMIN B12  FOLATE  IRON AND TIBC  FERRITIN  TYPE AND SCREEN  PREPARE RBC (CROSSMATCH)  ABO/RH   ____________________________________________  EKG   ____________________________________________  RADIOLOGY Lexine Baton, personally viewed and evaluated these images (plain radiographs) as part of my medical decision making, as well as reviewing the written report by the radiologist.  [place] Abdomen Limited Ruq  Result Date: 06/30/2018 CLINICAL DATA:  Acute onset of RIGHT UPPER QUADRANT abdominal pain associated with nausea that began approximately 2 hours prior to emergency department arrival. EXAM: ULTRASOUND ABDOMEN LIMITED RIGHT UPPER QUADRANT COMPARISON:  None. FINDINGS: Gallbladder: No shadowing gallstones or echogenic sludge. No gallbladder wall thickening or pericholecystic fluid. Negative sonographic Murphy sign according to the ultrasound technologist.  Common bile duct: Diameter: Approximately 3 mm. Liver: Normal size and echotexture without focal parenchymal abnormality. Portal vein is patent on color Doppler imaging with normal direction of blood flow towards the liver. IMPRESSION: Normal examination. Electronically Signed   By: Hulan Saas M.D.   On: 06/30/2018 17:25    ____________________________________________    PROCEDURES  Procedure(s) performed:    Procedures    Medications  sodium chloride flush (NS) 0.9 % injection 3 mL (has no administration in time range)  0.9 %  sodium chloride infusion (Manually program via Guardrails IV Fluids) ( Intravenous Transfusing/Transfer 06/30/18 2051)     ____________________________________________   INITIAL IMPRESSION / ASSESSMENT AND PLAN / ED COURSE  Pertinent labs & imaging results that were available during my care of the patient were reviewed by me and considered in my medical decision making (see chart for details).  Review of the [place] CSRS was performed in accordance of  the [place] prior to dispensing any controlled drugs.     Patient presented the emergency department for evaluation of an episode of abdominal pain today.  Abdominal pain completely resolved when patient had gone to the emergency department.  Incidentally hemoglobin and hematocrit were low at 6.5 and 25.3 which is lower than previous levels 1 year ago.  PT elevated at 23.5 and INR 2.1.  Hemoccult was positive.  Patient will be admitted for GI bleed to the hospitalist service.  Patient denies any symptoms currently. Patient is given ED precautions to return to the ED for any worsening or new symptoms.     ____________________________________________  FINAL CLINICAL IMPRESSION(S) / ED DIAGNOSES  Final diagnoses:  Abdominal pain  Gastrointestinal hemorrhage, unspecified gastrointestinal hemorrhage type      NEW MEDICATIONS STARTED DURING THIS VISIT:  ED Discharge Orders    None          This  chart was dictated using voice recognition software/Dragon. Despite best efforts to proofread, errors can occur which can change the meaning. Any change was purely unintentional.    Enid Derry, PA-C 06/30/18 2057    Jeanmarie Plant, MD 06/30/18 [phone]

## 2018-06-30 NOTE — H&P (Signed)
[employer] [facility] - [place] at [facility]   PATIENT NAME: Jasmine [name]    MR#:  [id number]  DATE OF BIRTH:  1961/01/12  DATE OF ADMISSION:  06/30/2018  PRIMARY CARE PHYSICIAN: Patient, No Pcp Per   REQUESTING/REFERRING PHYSICIAN: Dr. Ileana Roup  CHIEF COMPLAINT:   Chief Complaint  Patient presents with  . Nausea  . Abdominal Pain    HISTORY OF PRESENT ILLNESS:  Jasmine [name]  is a 58 y.o. 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 She has occasional complaints of biliary colic which resolved by themselves.  But it was more in the epigastric region this morning after eating breakfast.  Denies any history of heartburn.  She has known history of anemia with her last hemoglobin was 9.0 in September 2018.  Denies any active bleeding.  No dark stools or black tarry stools.  Denies taking any BC powders or NSAIDs over-the-counter.  Her pain resolved with GI cocktail in the emergency room.  However lab work indicated hemoglobin of 6.5 with MCV of 68.9.  Her INR was 2.1.  She is being admitted for anemia.  PAST MEDICAL HISTORY:   Past Medical History:  Diagnosis Date  . H/O cerebral venous sinus thrombosis 2013    PAST SURGICAL HISTORY:  No past surgical history on file.  SOCIAL HISTORY:   Social History   Tobacco Use  . Smoking status: Never Smoker  . Smokeless tobacco: Never Used  Substance Use Topics  . Alcohol use: No    FAMILY HISTORY:   Family History  Problem Relation Age of Onset  . Breast cancer Mother   . Bone cancer Mother     DRUG ALLERGIES:   Allergies  Allergen Reactions  . Codeine   . Shellfish Allergy     REVIEW OF SYSTEMS:   Review of Systems  Constitutional: Negative for chills, fever, malaise/fatigue and weight loss.  HENT: Negative for ear  discharge, ear pain, hearing loss, nosebleeds and tinnitus.   Eyes: Negative for blurred vision, double vision and photophobia.  Respiratory: Negative for cough, hemoptysis, shortness of breath and wheezing.   Cardiovascular: Negative for chest pain, palpitations, orthopnea and leg swelling.  Gastrointestinal: Positive for abdominal pain and nausea. Negative for constipation, diarrhea, heartburn, melena and vomiting.  Genitourinary: Negative for dysuria, frequency, hematuria and urgency.  Musculoskeletal: Negative for back pain, myalgias and neck pain.  Skin: Negative for rash.  Neurological: Positive for dizziness. Negative for tingling, tremors, sensory change, speech change, focal weakness and headaches.  Endo/Heme/Allergies: Does not bruise/bleed easily.  Psychiatric/Behavioral: Negative for depression.    MEDICATIONS AT HOME:   Prior to Admission medications   Medication Sig Start Date End Date Taking? Authorizing Provider  rivaroxaban (XARELTO) 20 MG TABS tablet Take 20 mg by mouth every evening.    Yes [provider]      VITAL SIGNS:  Blood pressure (!) 162/72, pulse 80, temperature 98 F (36.7 C), temperature source Oral, resp. rate 15, height 5\' 3"  (1.6 m), weight 90.7 kg, SpO2 100 %.  PHYSICAL EXAMINATION:  Physical Exam  GENERAL:  58 y.o.-year-old obese patient lying in the bed with no acute distress.  EYES: Pupils equal, round, reactive to light and accommodation. No scleral icterus. Extraocular muscles intact.  HEENT: Head atraumatic, normocephalic. Oropharynx and nasopharynx clear.  NECK:  Supple,  no jugular venous distention. No thyroid enlargement, no tenderness.  LUNGS: Normal breath sounds bilaterally, no wheezing, rales,rhonchi or crepitation. No use of accessory muscles of respiration.  CARDIOVASCULAR: S1, S2 normal. No murmurs, rubs, or gallops.  [name]: Soft, nontender, nondistended. Bowel sounds present. No organomegaly or mass.  EXTREMITIES: No  pedal edema, cyanosis, or clubbing.  NEUROLOGIC: Cranial nerves II through XII are intact. Muscle strength 5/5 in all extremities. Sensation intact. Gait not checked.  PSYCHIATRIC: The patient is alert and oriented x 3.  SKIN: No obvious rash, lesion, or ulcer.   LABORATORY PANEL:   CBC Recent Labs  Lab 06/30/18 1600  WBC 9.8  HGB 6.5*  HCT 25.3*  PLT 369   ------------------------------------------------------------------------------------------------------------------  Chemistries  Recent Labs  Lab 06/30/18 1600  NA 143  K 3.6  CL 108  CO2 25  GLUCOSE 131*  BUN 11  CREATININE 0.60  CALCIUM 9.0  AST 17  ALT 12  ALKPHOS 71  BILITOT 1.1   ------------------------------------------------------------------------------------------------------------------  Cardiac Enzymes Recent Labs  Lab 06/30/18 1624  TROPONINI <0.03   ------------------------------------------------------------------------------------------------------------------  RADIOLOGY:  Jasmine [name] Limited Ruq  Result Date: 06/30/2018 CLINICAL DATA:  Acute onset of RIGHT UPPER QUADRANT abdominal pain associated with nausea that began approximately 2 hours prior to emergency department arrival. EXAM: ULTRASOUND [name] LIMITED RIGHT UPPER QUADRANT COMPARISON:  None. FINDINGS: Gallbladder: No shadowing gallstones or echogenic sludge. No gallbladder wall thickening or pericholecystic fluid. Negative sonographic Murphy sign according to the ultrasound technologist. Common bile duct: Diameter: Approximately 3 mm. Liver: Normal size and echotexture without focal parenchymal abnormality. Portal vein is patent on color Doppler imaging with normal direction of blood flow towards the liver. IMPRESSION: Normal examination. Electronically Signed   By: Hulan Saashomas  Lawrence M.D.   On: 06/30/2018 17:25      IMPRESSION AND PLAN:   Jasmine [name]  is a 58 y.o. female with a known history of cerebral venous sinus thrombosis on  Xarelto for almost 7 years now presents to hospital secondary to sudden onset of epigastric and right upper quadrant pain this morning after eating breakfast.  1.  Acute on chronic anemia-concern for GI causes.  Stool for guaiac was positive in the emergency room. -Patient denies any active bleeding.  Patient on Xarelto.  Xarelto has been held for now. -Last dose of Xarelto was on 06/29/2018 evening.  GI has been notified. -We will start on clears for now.  Might need EGD prior to discharge. -Also noted to be microcytic anemia.  So labs have been ordered. -1 unit of packed RBC ordered to keep hemoglobin greater than 7.  2.  GERD symptoms-started on IV Protonix  3.  History of cerebral venous sinus thrombosis-we will need Xarelto for life according to patient.  But hold for now given anemia and possible GI bleed.  4.  DVT prophylaxis-teds and SCDs.   All the records are reviewed and case discussed with ED provider. Management plans discussed with the patient, family and they are in agreement.  CODE STATUS: Full Code  TOTAL TIME TAKING CARE OF THIS PATIENT: 50 minutes.    Enid Baasadhika Jannice Beitzel M.D on 06/30/2018 at 8:18 PM  Between 7am to 6pm - Pager - [phone]  After 6pm go to [email or web address] - Social research officer, governmentpassword EPAS [email or web address]  [facility] [place] [facility]  Office  [phone]  CC: Primary care physician; Patient, No Pcp Per   Note: This dictation was prepared with Dragon dictation along with smaller phrase technology. Any transcriptional errors that result from this  process are unintentional.

## 2018-07-01 DIAGNOSIS — K922 Gastrointestinal hemorrhage, unspecified: Principal | ICD-10-CM

## 2018-07-01 DIAGNOSIS — D509 Iron deficiency anemia, unspecified: Secondary | ICD-10-CM

## 2018-07-01 LAB — CBC
HCT: 27.9 % — ABNORMAL LOW (ref 36.0–46.0)
Hemoglobin: 7.4 g/dL — ABNORMAL LOW (ref 12.0–15.0)
MCH: 19.5 pg — ABNORMAL LOW (ref 26.0–34.0)
MCHC: 26.5 g/dL — ABNORMAL LOW (ref 30.0–36.0)
MCV: 73.6 fL — ABNORMAL LOW (ref 80.0–100.0)
Platelets: 293 10*3/uL (ref 150–400)
RBC: 3.79 MIL/uL — ABNORMAL LOW (ref 3.87–5.11)
RDW: 24.2 % — ABNORMAL HIGH (ref 11.5–15.5)
WBC: 8 10*3/uL (ref 4.0–10.5)
nRBC: 0 % (ref 0.0–0.2)

## 2018-07-01 LAB — BASIC METABOLIC PANEL
Anion gap: 9 (ref 5–15)
BUN: 10 mg/dL (ref 6–20)
CO2: 25 mmol/L (ref 22–32)
Calcium: 8.7 mg/dL — ABNORMAL LOW (ref 8.9–10.3)
Chloride: 109 mmol/L (ref 98–111)
Creatinine, Ser: 0.6 mg/dL (ref 0.44–1.00)
GFR calc Af Amer: >60 mL/min (ref >60)
GFR calc non Af Amer: >60 mL/min (ref >60)
Glucose, Bld: 94 mg/dL (ref 70–99)
Potassium: 3.6 mmol/L (ref 3.5–5.1)
Sodium: 143 mmol/L (ref 135–145)

## 2018-07-01 LAB — FERRITIN: Ferritin: 2 ng/mL — ABNORMAL LOW (ref 11–307)

## 2018-07-01 LAB — IRON AND TIBC
Iron: 42 ug/dL (ref 28–170)
Saturation Ratios: 7 % — ABNORMAL LOW (ref 10.4–31.8)
TIBC: 581 ug/dL — ABNORMAL HIGH (ref 250–450)
UIBC: 539 ug/dL

## 2018-07-01 LAB — VITAMIN B12: Vitamin B-12: 100 pg/mL — ABNORMAL LOW (ref 180–914)

## 2018-07-01 LAB — PROTIME-INR
INR: 1.6 — ABNORMAL HIGH (ref 0.8–1.2)
Prothrombin Time: 19.1 seconds — ABNORMAL HIGH (ref 11.4–15.2)

## 2018-07-01 LAB — FOLATE: Folate: 12 ng/mL (ref >5.9)

## 2018-07-01 MED ORDER — AMLODIPINE BESYLATE 5 MG PO TABS
5.0000 mg | ORAL_TABLET | Freq: Every day | ORAL | Status: DC
  Administered 2018-07-01 – 2018-07-04 (×4): 5 mg via ORAL
  Filled 2018-07-01 (×4): qty 1

## 2018-07-01 MED ORDER — PEG 3350-KCL-NA BICARB-NACL 420 G PO SOLR
4000.0000 mL | Freq: Once | ORAL | Status: AC
  Administered 2018-07-01: 4000 mL via ORAL
  Filled 2018-07-01: qty 4000

## 2018-07-01 MED ORDER — SODIUM CHLORIDE 0.9 % IV SOLN
150.0000 mg | Freq: Once | INTRAVENOUS | Status: AC
  Administered 2018-07-01: 150 mg via INTRAVENOUS
  Filled 2018-07-01: qty 7.5

## 2018-07-01 MED ORDER — CYANOCOBALAMIN 1000 MCG/ML IJ SOLN
1000.0000 ug | Freq: Once | INTRAMUSCULAR | Status: AC
  Administered 2018-07-01: 1000 ug via INTRAMUSCULAR
  Filled 2018-07-01: qty 1

## 2018-07-01 MED ORDER — SODIUM CHLORIDE 0.9 % IV SOLN
INTRAVENOUS | Status: DC
  Administered 2018-07-01 – 2018-07-02 (×3): via INTRAVENOUS

## 2018-07-01 NOTE — Consult Note (Signed)
Hematology/Oncology Consult note [facility] [phone] [phone]  Patient Care Team: Patient, No Pcp Per as PCP - General (General Practice)   Name of the patient: Jasmine [name]  [id number]  05/30/1960    Reason for consult: Iron deficiency anemia. H/o recurrent cerebral venous thrombosis   Requesting physician: DR. Tobi Bastosnna  Date of visit: 07/01/2018    History of presenting illness-patient is a 58 year old Caucasian female with a history of Extensive dural venous sinus thrombosis back in 2013.  She was started on Coumadin at that time.  She had hypercoagulable work-up done including prothrombin gene mutation, protein C, protein S activity, factor V activity and Antithrombin III levels which were unremarkable.  She also had work-up for antiphospholipid antibody syndrome which was negative.  No apparent cause for her dural venous sinus thrombosis was found at that time.  She continues to follow-up with neurology and was seen by them in 2016.  At that time her Coumadin was stopped as repeat MRI showed significant improvement in these findings and patient has not had any current strokes.  She was switched to low-dose aspirin.  She then presented with a second episode of dural venous sinus thrombosis in 2018.  At that time she was started on rivaraoxaban and she continues to be on that.  CBC in September 2018 showed a white count of 14.9, hemoglobin interval 9/30.4 with an MCV of 71.2 and a platelet count of 326 which was lower as compared to her baseline hemoglobin of 14 in 2013.  She now presents to the hospital with right upper quadrant abdominal pain and she was found to have New onset anemia with a hemoglobin of 6.5/25.3 with an MCV of 68.9 and was hence admitted.  B12 levels were low at 100.  Ferritin levels were low at 2 and iron studies showed elevated TIBC of 580 with a low iron saturation of 7%.  Folate levels were normal.  Patient denies any  recent EGD or colonoscopy.  Denies any recent use of NSAIDs.  Denies any bleeding in her stools or dark tarry stools   ECOG PS- 1    Review of systems- Review of Systems  Constitutional: Positive for malaise/fatigue. Negative for chills, fever and weight loss.  HENT: Negative for congestion, ear discharge and nosebleeds.   Eyes: Negative for blurred vision.  Respiratory: Negative for cough, hemoptysis, sputum production, shortness of breath and wheezing.   Cardiovascular: Negative for chest pain, palpitations, orthopnea and claudication.  Gastrointestinal: Negative for abdominal pain, blood in stool, constipation, diarrhea, heartburn, melena, nausea and vomiting.  Genitourinary: Negative for dysuria, flank pain, frequency, hematuria and urgency.  Musculoskeletal: Negative for back pain, joint pain and myalgias.  Skin: Negative for rash.  Neurological: Negative for dizziness, tingling, focal weakness, seizures, weakness and headaches.  Endo/Heme/Allergies: Does not bruise/bleed easily.  Psychiatric/Behavioral: Negative for depression and suicidal ideas. The patient does not have insomnia.     Allergies  Allergen Reactions  . Codeine   . Shellfish Allergy     Patient Active Problem List   Diagnosis Date Noted  . Anemia 06/30/2018     Past Medical History:  Diagnosis Date  . H/O cerebral venous sinus thrombosis 2013     Past Surgical History:  Procedure Laterality Date  . ESOPHAGOGASTRODUODENOSCOPY N/A 2001   approximately- for Reflux    Social History   Socioeconomic History  . Marital status: Single    Spouse name: Not on file  . Number of children: Not  on file  . Years of education: Not on file  . Highest education level: Not on file  Occupational History  . Not on file  Social Needs  . Financial resource strain: Not on file  . Food insecurity:    Worry: Not on file    Inability: Not on file  . Transportation needs:    Medical: Not on file    Non-medical:  Not on file  Tobacco Use  . Smoking status: Never Smoker  . Smokeless tobacco: Never Used  Substance and Sexual Activity  . Alcohol use: No  . Drug use: Not Currently  . Sexual activity: Not on file  Lifestyle  . Physical activity:    Days per week: Not on file    Minutes per session: Not on file  . Stress: Not on file  Relationships  . Social connections:    Talks on phone: Not on file    Gets together: Not on file    Attends religious service: Not on file    Active member of club or organization: Not on file    Attends meetings of clubs or organizations: Not on file    Relationship status: Not on file  . Intimate partner violence:    Fear of current or ex partner: Not on file    Emotionally abused: Not on file    Physically abused: Not on file    Forced sexual activity: Not on file  Other Topics Concern  . Not on file  Social History Narrative   Lives at home with her parents.  Independent at baseline.     Family History  Problem Relation Age of Onset  . Breast cancer Mother   . Bone cancer Mother      Current Facility-Administered Medications:  .  acetaminophen (TYLENOL) tablet 650 mg, 650 mg, Oral, Q6H PRN **OR** acetaminophen (TYLENOL) suppository 650 mg, 650 mg, Rectal, Q6H PRN, Enid Baas, MD .  amLODipine (NORVASC) tablet 5 mg, 5 mg, Oral, Daily, Shaune Pollack, MD, 5 mg at 07/01/18 0942 .  hydrALAZINE (APRESOLINE) injection 10 mg, 10 mg, Intravenous, Q4H PRN, Oralia Manis, MD .  ondansetron (ZOFRAN) tablet 4 mg, 4 mg, Oral, Q6H PRN **OR** ondansetron (ZOFRAN) injection 4 mg, 4 mg, Intravenous, Q6H PRN, Nemiah Commander, Radhika, MD .  pantoprazole (PROTONIX) injection 40 mg, 40 mg, Intravenous, Q12H, Kalisetti, Radhika, MD, 40 mg at 07/01/18 0900 .  sodium chloride flush (NS) 0.9 % injection 3 mL, 3 mL, Intravenous, Once, Enid Baas, MD   Physical exam:  Vitals:   07/01/18 0415 07/01/18 0652 07/01/18 0940 07/01/18 1201  BP: (!) 184/57 (!) 155/68 (!)  149/82 (!) 152/60  Pulse: 69 (!) 57 63 (!) 53  Resp: 16   16  Temp: 98.1 F (36.7 C)  98.5 F (36.9 C) 98.2 F (36.8 C)  TempSrc: Oral  Oral Oral  SpO2: 100%  99% 100%  Weight:      Height:       Physical Exam Constitutional:      General: She is in acute distress.  HENT:     Head: Normocephalic and atraumatic.  Eyes:     Pupils: Pupils are equal, round, and reactive to light.  Neck:     Musculoskeletal: Normal range of motion.  Cardiovascular:     Rate and Rhythm: Normal rate and regular rhythm.     Heart sounds: Normal heart sounds.  Pulmonary:     Effort: Pulmonary effort is normal.     Breath sounds:  Normal breath sounds.  Abdominal:     General: Bowel sounds are normal. There is no distension.     Palpations: Abdomen is soft.     Tenderness: There is no abdominal tenderness.  Skin:    General: Skin is warm and dry.  Neurological:     Mental Status: She is alert and oriented to person, place, and time.        CMP Latest Ref Rng & Units 07/01/2018  Glucose 70 - 99 mg/dL 94  BUN 6 - 20 mg/dL 10  Creatinine 1.61 - 0.96 mg/dL 0.45  Sodium 409 - 811 mmol/L 143  Potassium 3.5 - 5.1 mmol/L 3.6  Chloride 98 - 111 mmol/L 109  CO2 22 - 32 mmol/L 25  Calcium 8.9 - 10.3 mg/dL 9.1(Y)  Total Protein 6.5 - 8.1 g/dL -  Total Bilirubin 0.3 - 1.2 mg/dL -  Alkaline Phos 38 - 782 U/L -  AST 15 - 41 U/L -  ALT 0 - 44 U/L -   CBC Latest Ref Rng & Units 07/01/2018  WBC 4.0 - 10.5 K/uL 8.0  Hemoglobin 12.0 - 15.0 g/dL 7.4(L)  Hematocrit 36.0 - 46.0 % 27.9(L)  Platelets 150 - 400 K/uL 293    @  US Abdomen Limited Ruq  Result Date: 06/30/2018 CLINICAL DATA:  Acute onset of RIGHT UPPER QUADRANT abdominal pain associated with nausea that began approximately 2 hours prior to emergency department arrival. EXAM: ULTRASOUND ABDOMEN LIMITED RIGHT UPPER QUADRANT COMPARISON:  None. FINDINGS: Gallbladder: No shadowing gallstones or echogenic sludge. No gallbladder wall  thickening or pericholecystic fluid. Negative sonographic Murphy sign according to the ultrasound technologist. Common bile duct: Diameter: Approximately 3 mm. Liver: Normal size and echotexture without focal parenchymal abnormality. Portal vein is patent on color Doppler imaging with normal direction of blood flow towards the liver. IMPRESSION: Normal examination. Electronically Signed   By: Hulan Saas M.D.   On: 06/30/2018 17:25    Assessment and plan- Patient is a 58 y.o. 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 Iron  deficiency and B12 patient's anemia: Patient has evidence of chronic microcytic anemia he is dating back to 2018 and laboratory evidence of iron deficiency and B12 deficiency.  Recommend starting her on B12 thousand micrograms IM and she will need weekly B12 x4 followed by monthly doses which I can arrange an outpatient.  Also for iron deficiency I will arrange for outpatient Feraheme.  Discussed risks and benefits of Feraheme 510 mg IV weekly x2 including all but limited to headaches, swelling and possible risk of infusion reaction.  Patient understands and agrees to proceed.  At this time we are awaiting for GI work-up to a certain the etiology of her iron deficiency anemia.  I will follow-up with her as an outpatient for this.  2.  History of recurrent dural venous sinus thrombosis: Patient had an initial episode in 2013 was off anticoagulation between 23-2018 and had a second episode.  She therefore has high risk for recurrent thrombosis and as such her anticoagulation should be interrupted for as little time as possible.  If GI work-up shows no evidence of active bleeding, should be restarted on anticoagulation and I will follow-up with her CBC loosely as an outpatient.  She already had a hypercoagulable work-up done in the past which was unremarkable and does not have to be treated at  this time.  Also it would not change  management as she needs to be on lifelong anticoagulation.  If anticoagulation cannot be be started safely at this point I will have to refer her to neurology to see if there would be anything else that could be done to prevent recurrent dural venous sinus thrombosis in her case.   Thank you for this kind referral and the opportunity to participate in the care of this patient   Visit Diagnosis 1. Gastrointestinal hemorrhage, unspecified gastrointestinal hemorrhage type   2. Abdominal pain     Dr. Owens Shark, MD, MPH [facility] [phone] 07/01/2018  11:02 PM

## 2018-07-01 NOTE — Progress Notes (Signed)
[employer] - [place] at [facility]   PATIENT NAME: Jasmine [name]    MR#:  [id number]  DATE OF BIRTH:  08/19/1960  SUBJECTIVE:  CHIEF COMPLAINT:   Chief Complaint  Patient presents with   Nausea   Abdominal Pain   The patient feels better, no complaints. REVIEW OF SYSTEMS:  Review of Systems  Constitutional: Negative for chills, fever and malaise/fatigue.  HENT: Negative for sore throat.   Eyes: Negative for blurred vision and double vision.  Respiratory: Negative for cough, hemoptysis, shortness of breath, wheezing and stridor.   Cardiovascular: Negative for chest pain, palpitations, orthopnea and leg swelling.  Gastrointestinal: Negative for abdominal pain, blood in stool, diarrhea, melena, nausea and vomiting.  Genitourinary: Negative for dysuria, flank pain and hematuria.  Musculoskeletal: Negative for back pain and joint pain.  Skin: Negative for rash.  Neurological: Negative for dizziness, sensory change, focal weakness, seizures, loss of consciousness, weakness and headaches.  Endo/Heme/Allergies: Negative for polydipsia.  Psychiatric/Behavioral: Negative for depression. The patient is not nervous/anxious.     DRUG ALLERGIES:   Allergies  Allergen Reactions   Codeine    Shellfish Allergy    VITALS:  Blood pressure (!) 152/60, pulse (!) 53, temperature 98.2 F (36.8 C), temperature source Oral, resp. rate 16, height 5\' 3"  (1.6 m), weight 91 kg, SpO2 100 %. PHYSICAL EXAMINATION:  Physical Exam Constitutional:      General: She is not in acute distress.    Appearance: Normal appearance. She is obese.  HENT:     Head: Normocephalic.     Mouth/Throat:     Mouth: Mucous membranes are moist.  Eyes:     General: No scleral icterus.    Conjunctiva/sclera: Conjunctivae normal.     Pupils: Pupils are equal, round, and reactive to light.  Neck:     Musculoskeletal: Normal range of motion and neck supple.     Vascular: No JVD.     Trachea: No  tracheal deviation.  Cardiovascular:     Rate and Rhythm: Normal rate and regular rhythm.     Heart sounds: Normal heart sounds. No murmur. No gallop.   Pulmonary:     Effort: Pulmonary effort is normal. No respiratory distress.     Breath sounds: Normal breath sounds. No wheezing or rales.  Abdominal:     General: Bowel sounds are normal. There is no distension.     Palpations: Abdomen is soft.     Tenderness: There is no abdominal tenderness. There is no rebound.  Musculoskeletal: Normal range of motion.        General: No tenderness.     Right lower leg: No edema.     Left lower leg: No edema.  Skin:    Coloration: Skin is not jaundiced or pale.     Findings: No bruising, erythema or rash.  Neurological:     General: No focal deficit present.     Mental Status: She is alert and oriented to person, place, and time.     Cranial Nerves: No cranial nerve deficit.  Psychiatric:        Mood and Affect: Mood normal.    LABORATORY PANEL:  Female CBC Recent Labs  Lab 07/01/18 0533  WBC 8.0  HGB 7.4*  HCT 27.9*  PLT 293   ------------------------------------------------------------------------------------------------------------------ Chemistries  Recent Labs  Lab 06/30/18 1600 07/01/18 0533  NA 143 143  K 3.6 3.6  CL 108 109  CO2 25 25  GLUCOSE 131* 94  BUN  11 10  CREATININE 0.60 0.60  CALCIUM 9.0 8.7*  AST 17  --   ALT 12  --   ALKPHOS 71  --   BILITOT 1.1  --    RADIOLOGY:  US Abdomen Limited Ruq  Result Date: 06/30/2018 CLINICAL DATA:  Acute onset of RIGHT UPPER QUADRANT abdominal pain associated with nausea that began approximately 2 hours prior to emergency department arrival. EXAM: ULTRASOUND ABDOMEN LIMITED RIGHT UPPER QUADRANT COMPARISON:  None. FINDINGS: Gallbladder: No shadowing gallstones or echogenic sludge. No gallbladder wall thickening or pericholecystic fluid. Negative sonographic Murphy sign according to the ultrasound technologist. Common bile  duct: Diameter: Approximately 3 mm. Liver: Normal size and echotexture without focal parenchymal abnormality. Portal vein is patent on color Doppler imaging with normal direction of blood flow towards the liver. IMPRESSION: Normal examination. Electronically Signed   By: Hulan Saas M.D.   On: 06/30/2018 17:25   ASSESSMENT AND PLAN:   Jasmine [name]  is a 58 y.o. 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  1.  Acute on chronic anemia-concern for GI causes.  Stool for guaiac was positive in the emergency room. -Patient denies any active bleeding.   -Last dose of Xarelto was on 06/29/2018 evening.  Hold Xarelto. S?P 1 unit of packed RBC, Hb 7.4. Per Dr. Tobi Bastos, EGD and colonoscopy tomorrow, IV PPI, IV iron, parenteral B12 replacement. Check intrinsic factor and antiparietal cell antibody.  Hematology consult.  2.  GERD symptoms-started on IV Protonix  3.  History of cerebral venous sinus thrombosis-we will need Xarelto for life according to patient.  But hold for now given anemia and possible GI bleed.  Hematology consult.  4.  DVT prophylaxis-teds and SCDs.  All the records are reviewed and case discussed with Care Management/Social Worker. Management plans discussed with the patient, family and they are in agreement.  CODE STATUS: Full Code  TOTAL TIME TAKING CARE OF THIS PATIENT: 32 minutes.   More than 50% of the time was spent in counseling/coordination of care: YES  POSSIBLE D/C IN 2 DAYS, DEPENDING ON CLINICAL CONDITION.   Shaune Pollack M.D on 07/01/2018 at 3:34 PM  Between 7am to 6pm - Pager - [phone]  After 6pm go to [email or web address] - Therapist, nutritional Hospitalists

## 2018-07-01 NOTE — Consult Note (Signed)
Jasmine MoodKiran Jayquon [name] , MD [address], [address], [place], [place], [id number] [address], [address], [place], [place], [address] Phone: [phone]  Fax: [phone]  Consultation  Referring Provider:   Dr Nemiah CommanderKalisetti Primary Care Physician:  Patient, No Pcp Per Primary Gastroenterologist:  None         Reason for Consultation:     Anemia   Date of Admission:  06/30/2018 Date of Consultation:  07/01/2018         HPI:   Jasmine FreerKande [name] is a 58 y.o. 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  I have been consulted for anemia.  Looking back into her records she has had a microcytic anemia for over a year.  1 year back hemoglobin was 8.9 g with an MCV of 66.9 when [facility]  On admission of 6.5 g with an MCV of 68.9.  I do note that back in 2013 she had a hemoglobin of 14.3 with an MCV of 99.  I am unsure why this low hemoglobin checked a year back was not [facility]. On admission INR was 2.1.  Iron studies checked shows a ferritin of 2, B12 100, last dose of Xarelto was on 06/29/2018 p.m. no active overt blood loss noted.  Liver function tests are completely normal, lipase is normal at 41.  Denies any overt blood loss, no nsaid use. No recent EGD or colonoscopy. No symptoms presently. Feels well had a unit of blood.  Past Medical History:  Diagnosis Date  . H/O cerebral venous sinus thrombosis 2013    Past Surgical History:  Procedure Laterality Date  . ESOPHAGOGASTRODUODENOSCOPY N/A 2001   approximately- for Reflux    Prior to Admission medications   Medication Sig Start Date End Date Taking? Authorizing Provider  rivaroxaban (XARELTO) 20 MG TABS tablet Take 20 mg by mouth every evening.    Yes [provider]    Family History  Problem Relation Age of Onset  . Breast cancer Mother   . Bone cancer Mother       Social History   Tobacco Use  . Smoking status: Never Smoker  . Smokeless tobacco: Never Used  Substance Use Topics  . Alcohol use: No  . Drug use: Not Currently    Allergies as of 06/30/2018 - Review Complete 06/30/2018  Allergen Reaction Noted  . Codeine  10/26/2016  . Shellfish allergy  10/26/2016    Review of Systems:    All systems reviewed and negative except where noted in HPI.   Physical Exam:  Vital signs in last 24 hours: Temp:  [98 F (36.7 C)-98.8 F (37.1 C)] 98.5 F (36.9 C) (05/17 0940) Pulse Rate:  [57-84] 63 (05/17 0940) Resp:  [10-22] 16 (05/17 0415) BP: (149-208)/(57-82) 149/82 (05/17 0940) SpO2:  [98 %-100 %] 99 % (05/17 0940) Weight:  [90.7 kg-91 kg] 91 kg (05/16 2100) Last BM Date: 06/30/18 General:   Pleasant, cooperative in NAD Head:  Normocephalic and atraumatic. Eyes:   No icterus.   Conjunctiva pink. PERRLA. Ears:  Normal auditory acuity. Neck:  Supple; no masses or thyroidomegaly Lungs: Respirations even and unlabored. Lungs clear to auscultation bilaterally.   No wheezes, crackles, or rhonchi.  Heart:  Regular rate and rhythm;  Without murmur, clicks, rubs or gallops Abdomen:  Soft, nondistended, nontender. Normal bowel sounds. No appreciable masses or  hepatomegaly.  No rebound or guarding.  Neurologic:  Alert and oriented x3;  grossly normal neurologically. Skin:  Intact without significant lesions or rashes. Cervical Nodes:  No significant cervical adenopathy. Psych:  Alert and cooperative. Normal affect.  LAB RESULTS: Recent Labs    06/30/18 1600 06/30/18 2314 07/01/18 0533  WBC 9.8  --  8.0  HGB 6.5* 7.4* 7.4*  HCT 25.3* 27.8* 27.9*  PLT 369  --  293   BMET Recent Labs    06/30/18 1600 07/01/18 0533  NA 143 143  K 3.6 3.6  CL 108 109  CO2 25 25  GLUCOSE 131* 94  BUN 11 10  CREATININE 0.60 0.60  CALCIUM 9.0 8.7*   LFT Recent Labs    06/30/18 1600  PROT 7.5  ALBUMIN 4.2  AST 17  ALT 12  ALKPHOS 71  BILITOT  1.1   PT/INR Recent Labs    06/30/18 1649 07/01/18 0533  LABPROT 23.5* 19.1*  INR 2.1* 1.6*    STUDIES: US Abdomen Limited Ruq  Result Date: 06/30/2018 CLINICAL DATA:  Acute onset of RIGHT UPPER QUADRANT abdominal pain associated with nausea that began approximately 2 hours prior to emergency department arrival. EXAM: ULTRASOUND ABDOMEN LIMITED RIGHT UPPER QUADRANT COMPARISON:  None. FINDINGS: Gallbladder: No shadowing gallstones or echogenic sludge. No gallbladder wall thickening or pericholecystic fluid. Negative sonographic Murphy sign according to the ultrasound technologist. Common bile duct: Diameter: Approximately 3 mm. Liver: Normal size and echotexture without focal parenchymal abnormality. Portal vein is patent on color Doppler imaging with normal direction of blood flow towards the liver. IMPRESSION: Normal examination. Electronically Signed   By: Hulan Saas M.D.   On: 06/30/2018 17:25      Impression / Plan:   Jasmine [name] is a 58 y.o. y/o female with a past medical history of cerebral venous thrombosis 7 years back on Xarelto.  She presents to the emergency room with sudden onset epigastric and right upper quadrant pain.  Liver function tests are completely normal.  Lipase normal.  Found to have significant anemia.  Looking back into her records she has been anemic for over a year when checked at [facility].  Unclear why this was not followed up and acted upon.  Iron studies performed at our hospital show that she is severely iron deficient.  With a ferritin of 2, in addition has a low B12 100.  Right upper quadrant ultrasound is normal.No overt blood loss  Thank you for involving me in the care of this patient.    Plan 1.  IV PPI, IV iron, parenteral B12 replacement. 2.  Check intrinsic factor and antiparietal cell antibody. 3.  Xarelto has been held and she will be ready for an upper endoscopy and colonoscopy tomorrow [2 days of holding Xarelto].  If both are negative she will  need a capsule study of the small bowel. 4.  I will consult hematology for 2 reasons to set up IV iron as an outpatient on discharge as well as to discuss whether she truly requires Xarelto for over 7 years after 1 event of cerebral venous thrombosis.  I have discussed alternative options, risks & benefits,  which include, but are not limited to, bleeding, infection, perforation,respiratory complication & drug reaction.  The patient agrees with this plan & written consent will be obtained.       LOS: 1 day   Jasmine Mood, MD  07/01/2018, 10:50 AM

## 2018-07-02 ENCOUNTER — Encounter: Payer: Self-pay | Admitting: Anesthesiology

## 2018-07-02 ENCOUNTER — Inpatient Hospital Stay: Payer: Self-pay | Admitting: Anesthesiology

## 2018-07-02 ENCOUNTER — Encounter
Admission: EM | Disposition: A | Discharge status: Home / Self Care | Payer: Self-pay | Source: Home / Self Care | Attending: Internal Medicine

## 2018-07-02 DIAGNOSIS — K449 Diaphragmatic hernia without obstruction or gangrene: Secondary | ICD-10-CM

## 2018-07-02 DIAGNOSIS — D500 Iron deficiency anemia secondary to blood loss (chronic): Secondary | ICD-10-CM

## 2018-07-02 HISTORY — PX: COLONOSCOPY WITH PROPOFOL: SHX5780

## 2018-07-02 HISTORY — PX: ESOPHAGOGASTRODUODENOSCOPY (EGD) WITH PROPOFOL: SHX5813

## 2018-07-02 LAB — HEMOGLOBIN: Hemoglobin: 7.3 g/dL — ABNORMAL LOW (ref 12.0–15.0)

## 2018-07-02 SURGERY — ESOPHAGOGASTRODUODENOSCOPY (EGD) WITH PROPOFOL
Anesthesia: General

## 2018-07-02 MED ORDER — HYDRALAZINE HCL 20 MG/ML IJ SOLN: 10.0000 mg | INTRAMUSCULAR | Status: DC | PRN

## 2018-07-02 MED ORDER — SODIUM CHLORIDE 0.9 % IV SOLN
INTRAVENOUS | Status: DC | PRN
  Administered 2018-07-02: 12:00:00 via INTRAVENOUS

## 2018-07-02 MED ORDER — PROPOFOL 500 MG/50ML IV EMUL
INTRAVENOUS | Status: AC
  Filled 2018-07-02: qty 50

## 2018-07-02 MED ORDER — PROPOFOL 500 MG/50ML IV EMUL
INTRAVENOUS | Status: DC | PRN
  Administered 2018-07-02: 150 ug/kg/min via INTRAVENOUS

## 2018-07-02 MED ORDER — PROPOFOL 10 MG/ML IV BOLUS
INTRAVENOUS | Status: DC | PRN
  Administered 2018-07-02 (×2): 50 mg via INTRAVENOUS

## 2018-07-02 MED ORDER — PROPOFOL 10 MG/ML IV BOLUS
INTRAVENOUS | Status: AC
  Filled 2018-07-02: qty 20

## 2018-07-02 MED ORDER — PANTOPRAZOLE SODIUM 40 MG PO TBEC
40.0000 mg | DELAYED_RELEASE_TABLET | Freq: Every day | ORAL | Status: DC
  Administered 2018-07-02 – 2018-07-04 (×3): 40 mg via ORAL
  Filled 2018-07-02 (×4): qty 1

## 2018-07-02 NOTE — Progress Notes (Signed)
[employer] [facility] [place] [facility]   PATIENT NAME: Jasmine [name]    MR#:  [id number]  DATE OF BIRTH:  08/01/1960  SUBJECTIVE:  CHIEF COMPLAINT:   Chief Complaint  Patient presents with  . Nausea  . Abdominal Pain   The patient has no complaints. REVIEW OF SYSTEMS:  Review of Systems  Constitutional: Negative for chills, fever and malaise/fatigue.  HENT: Negative for sore throat.   Eyes: Negative for blurred vision and double vision.  Respiratory: Negative for cough, hemoptysis, shortness of breath, wheezing and stridor.   Cardiovascular: Negative for chest pain, palpitations, orthopnea and leg swelling.  Gastrointestinal: Negative for abdominal pain, blood in stool, diarrhea, melena, nausea and vomiting.  Genitourinary: Negative for dysuria, flank pain and hematuria.  Musculoskeletal: Negative for back pain and joint pain.  Skin: Negative for rash.  Neurological: Negative for dizziness, sensory change, focal weakness, seizures, loss of consciousness, weakness and headaches.  Endo/Heme/Allergies: Negative for polydipsia.  Psychiatric/Behavioral: Negative for depression. The patient is not nervous/anxious.     DRUG ALLERGIES:   Allergies  Allergen Reactions  . Tape Itching, Dermatitis and Rash    Transpore tape  . Codeine   . Shellfish Allergy    VITALS:  Blood pressure 130/79, pulse 68, temperature (!) 97 F (36.1 C), temperature source Tympanic, resp. rate (!) 117, height 5\' 3"  (1.6 m), weight 91 kg, SpO2 100 %. PHYSICAL EXAMINATION:  Physical Exam Constitutional:      General: She is not in acute distress.    Appearance: Normal appearance. She is obese.  HENT:     Head: Normocephalic.     Mouth/Throat:     Mouth: Mucous membranes are moist.  Eyes:     General: No scleral icterus.    Conjunctiva/sclera: Conjunctivae normal.     Pupils: Pupils are equal, round, and reactive to light.  Neck:     Musculoskeletal: Normal range of motion and  neck supple.     Vascular: No JVD.     Trachea: No tracheal deviation.  Cardiovascular:     Rate and Rhythm: Normal rate and regular rhythm.     Heart sounds: Normal heart sounds. No murmur. No gallop.   Pulmonary:     Effort: Pulmonary effort is normal. No respiratory distress.     Breath sounds: Normal breath sounds. No wheezing or rales.  Abdominal:     General: Bowel sounds are normal. There is no distension.     Palpations: Abdomen is soft.     Tenderness: There is no abdominal tenderness. There is no rebound.  Musculoskeletal: Normal range of motion.        General: No tenderness.     Right lower leg: No edema.     Left lower leg: No edema.  Skin:    Coloration: Skin is not jaundiced or pale.     Findings: No bruising, erythema or rash.  Neurological:     General: No focal deficit present.     Mental Status: She is alert and oriented to person, place, and time.     Cranial Nerves: No cranial nerve deficit.  Psychiatric:        Mood and Affect: Mood normal.    LABORATORY PANEL:  Female CBC Recent Labs  Lab 07/01/18 0533 07/02/18 0517  WBC 8.0  --   HGB 7.4* 7.3*  HCT 27.9*  --   PLT 293  --    ------------------------------------------------------------------------------------------------------------------ Chemistries  Recent Labs  Lab 06/30/18 1600 07/01/18  0533  NA 143 143  K 3.6 3.6  CL 108 109  CO2 25 25  GLUCOSE 131* 94  BUN 11 10  CREATININE 0.60 0.60  CALCIUM 9.0 8.7*  AST 17  --   ALT 12  --   ALKPHOS 71  --   BILITOT 1.1  --    RADIOLOGY:  No results found. ASSESSMENT AND PLAN:   Jasmine [name]  is a 59 y.o. 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  1.  Acute on chronic anemia-concern for GI causes.  Stool for guaiac was positive in the emergency room.  -Patient denies any active bleeding.   -Last dose of Xarelto was on 06/29/2018 evening.  Hold Xarelto. S?P 1 unit of packed RBC, Hb 7.3.  EGD: Large hiatal hernia, need follow up surgeon as outpatient;  Colonoscopy: Normal. Small bowel capsule study if Hb continues to drop. She was given PPI, IV iron, parenteral B12 replacement. Follow up intrinsic factor and antiparietal cell antibody with hematologist.   2.  GERD symptoms-started on IV Protonix  3.  History of cerebral venous sinus thrombosis-Xarelto is hold for now given anemia and possible GI bleed.  Follow up Dr. Smith Robert as outpatient.  4.  DVT prophylaxis-teds and SCDs.  All the records are reviewed and case discussed with Care Management/Social Worker. Management plans discussed with the patient, family and they are in agreement.  CODE STATUS: Full Code  TOTAL TIME TAKING CARE OF THIS PATIENT: 26 minutes.   More than 50% of the time was spent in counseling/coordination of care: YES  POSSIBLE D/C IN 1-2 DAYS, DEPENDING ON CLINICAL CONDITION.   Shaune Pollack M.D on 07/02/2018 at 3:49 PM  Between 7am to 6pm - Pager - [phone]  After 6pm go to [email or web address] - Therapist, nutritional Hospitalists

## 2018-07-02 NOTE — Op Note (Signed)
[facility] Gastroenterology Patient Name: Jasmine [name] Procedure Date: 07/02/2018 12:11 PM MRN: [id number] Account #: [id number] Date of Birth: 1960/03/31 Admit Type: Inpatient Age: 57 Room: [facility] ENDO ROOM 4 Gender: Female Note Status: Finalized Procedure:            Colonoscopy Indications:          Iron deficiency anemia Providers:            Michale Emmerich B. Maximino Greenland MD, MD Referring MD:         Sallye Lat Md, MD (Referring MD) Medicines:            Monitored Anesthesia Care Complications:        No immediate complications. Procedure:            Pre-Anesthesia Assessment:                       - Prior to the procedure, a History and Physical was                        performed, and patient medications, allergies and                        sensitivities were reviewed. The patient's tolerance of                        previous anesthesia was reviewed.                       - The risks and benefits of the procedure and the                        sedation options and risks were discussed with the                        patient. All questions were answered and informed                        consent was obtained.                       - Patient identification and proposed procedure were                        verified prior to the procedure by the physician, the                        nurse, the anesthetist and the technician. The                        procedure was verified in the pre-procedure area in the                        procedure room in the endoscopy suite.                       - ASA Grade Assessment: III - A patient with severe                        systemic disease.                       -  After reviewing the risks and benefits, the patient                        was deemed in satisfactory condition to undergo the                        procedure.                       After obtaining informed consent, the colonoscope was                        passed  under direct vision. Throughout the procedure,                        the patient's blood pressure, pulse, and oxygen                        saturations were monitored continuously. The                        Colonoscope was introduced through the anus and                        advanced to the the terminal ileum. The colonoscopy was                        performed with ease. The patient tolerated the                        procedure well. The quality of the bowel preparation                        was fair except the ascending colon was poor and the                        cecum was poor. Findings:      The perianal and digital rectal examinations were normal.      There is no endoscopic evidence of bleeding in the entire colon.      The retroflexed view of the distal rectum and anal verge was normal and       showed no anal or rectal abnormalities.      Semi-liquid stool was found in the entire colon, interfering with       visualization. Impression:           - The distal rectum and anal verge are normal on                        retroflexion view.                       - Stool in the entire examined colon.                       - No specimens collected. Recommendation:       - Due to iron deficiency anemia and no etiology seen on                        procedures today, next step would be small bowel  capsule study. This study is not available at [facility] at                        this time due to equipment malfunction. Primary team to                        monitor Hemoglobin closely and if it continues to drop,                        pt should be transferred to another facility with small                        bowel capsule capabilities. If she has signs of active                        GI bleeding (which she has not had so far), please                        order RBC scan or CTA depending on the rate of                        bleeding. If hemoglobin improves,  outpatient small                        bowel capsule at outlying facility can be scheduled.                       - Resume previous diet.                       - Continue present medications.                       - Repeat colonoscopy in 6 months for screening purposes.                       - Refer to a surgeon as outpatient for large hiatal                        hernia.                       - Return to primary care physician as previously                        scheduled.                       - The findings and recommendations were discussed with                        the patient.                       - Continue Serial CBCs and transfuse PRN                       - F/u in GI clinic in 2 weeks Procedure Code(s):    --- Professional ---  57846, Colonoscopy, flexible; diagnostic, including                        collection of specimen(s) by brushing or washing, when                        performed (separate procedure) Diagnosis Code(s):    --- Professional ---                       D50.9, Iron deficiency anemia, unspecified CPT copyright 2019 American Medical Association. All rights reserved. The codes documented in this report are preliminary and upon coder review may  be revised to meet current compliance requirements.  Melodie Bouillon, MD Michel Bickers B. Maximino Greenland MD, MD 07/02/2018 1:03:43 PM This report has been signed electronically. Number of Addenda: 0 Note Initiated On: 07/02/2018 12:11 PM Scope Withdrawal Time: 0 hours 6 minutes 22 seconds  Total Procedure Duration: 0 hours 12 minutes 48 seconds       [facility]

## 2018-07-02 NOTE — Anesthesia Post-op Follow-up Note (Signed)
Anesthesia QCDR form completed.        

## 2018-07-02 NOTE — Anesthesia Postprocedure Evaluation (Signed)
Anesthesia Post Note  Patient: Carmalita Micheletti  Procedure(s) Performed: ESOPHAGOGASTRODUODENOSCOPY (EGD) WITH PROPOFOL (N/A ) COLONOSCOPY WITH PROPOFOL (N/A )  Patient location during evaluation: Endoscopy Anesthesia Type: General Level of consciousness: awake and alert Pain management: pain level controlled Vital Signs Assessment: post-procedure vital signs reviewed and stable Respiratory status: spontaneous breathing, nonlabored ventilation, respiratory function stable and patient connected to nasal cannula oxygen Cardiovascular status: blood pressure returned to baseline and stable Postop Assessment: no apparent nausea or vomiting Anesthetic complications: no     Last Vitals:  Vitals:   07/02/18 1313 07/02/18 1323  BP: 126/71 130/79  Pulse:    Resp:    Temp:    SpO2: 100%     Last Pain:  Vitals:   07/02/18 1323  TempSrc:   PainSc: 0-No pain                 Verdelle Valtierra S

## 2018-07-02 NOTE — Op Note (Signed)
[facility] Gastroenterology Patient Name: Jasmine [name] Procedure Date: 07/02/2018 12:11 PM MRN: [id number] Account #: [id number] Date of Birth: 01-Jul-1960 Admit Type: Inpatient Age: 58 Room: [facility] ENDO ROOM 4 Gender: Female Note Status: Finalized Procedure:            Upper GI endoscopy Indications:          Iron deficiency anemia Providers:            Jasmine Siegfried B. Maximino Greenland MD, MD Referring MD:         Jasmine Lat Md, MD (Referring MD) Medicines:            Monitored Anesthesia Care Complications:        No immediate complications. Procedure:            Pre-Anesthesia Assessment:                       - Prior to the procedure, a History and Physical was                        performed, and patient medications, allergies and                        sensitivities were reviewed. The patient's tolerance of                        previous anesthesia was reviewed.                       - The risks and benefits of the procedure and the                        sedation options and risks were discussed with the                        patient. All questions were answered and informed                        consent was obtained.                       - Patient identification and proposed procedure were                        verified prior to the procedure by the physician, the                        nurse, the anesthesiologist, the anesthetist and the                        technician. The procedure was verified in the procedure                        room.                       - ASA Grade Assessment: III - A patient with severe                        systemic disease.  After obtaining informed consent, the endoscope was                        passed under direct vision. Throughout the procedure,                        the patient's blood pressure, pulse, and oxygen                        saturations were monitored continuously. The Endoscope            was introduced through the mouth, and advanced to the                        fourth part of duodenum. The upper GI endoscopy was                        accomplished with ease. The patient tolerated the                        procedure well. Findings:      The examined esophagus was normal.      The Z-line was irregular.      A large hiatal hernia was present.      The exam of the stomach was otherwise normal.      The duodenal bulb, second portion of the duodenum, third portion of the       duodenum, fourth portion of the duodenum and examined duodenum were       normal. Impression:           - Normal esophagus.                       - Z-line irregular.                       - Large hiatal hernia.                       - Normal duodenal bulb, second portion of the duodenum,                        third portion of the duodenum, fourth portion of the                        duodenum and examined duodenum.                       - No specimens collected. Recommendation:       - Continue present medications.                       - Patient has a contact number available for                        emergencies. The signs and symptoms of potential                        delayed complications were discussed with the patient.                        Return to normal activities tomorrow. Written discharge  instructions were provided to the patient.                       - Discharge patient to home (with escort).                       - The findings and recommendations were discussed with                        the patient.                       - Perform a colonoscopy today. Procedure Code(s):    --- Professional ---                       [phone], Esophagogastroduodenoscopy, flexible, transoral;                        diagnostic, including collection of specimen(s) by                        brushing or washing, when performed (separate procedure) Diagnosis Code(s):     --- Professional ---                       K22.8, Other specified diseases of esophagus                       K44.9, Diaphragmatic hernia without obstruction or                        gangrene                       D50.9, Iron deficiency anemia, unspecified CPT copyright 2019 American Medical Association. All rights reserved. The codes documented in this report are preliminary and upon coder review may  be revised to meet current compliance requirements.  Jasmine BouillonVarnita Tempest [place], MD Jasmine BickersVarnita B. Maximino Greenlandahiliani MD, MD 07/02/2018 12:31:02 PM This report has been signed electronically. Number of Addenda: 0 Note Initiated On: 07/02/2018 12:11 PM Estimated Blood Loss: Estimated blood loss: none.      [facility]

## 2018-07-02 NOTE — Anesthesia Preprocedure Evaluation (Signed)
Anesthesia Evaluation  Patient identified by MRN, date of birth, ID band Patient awake    Reviewed: Allergy & Precautions, NPO status , Patient's Chart, lab work & pertinent test results, reviewed documented beta blocker date and time   Airway Mallampati: III  TM Distance: >3 FB     Dental  (+) Chipped   Pulmonary           Cardiovascular      Neuro/Psych    GI/Hepatic   Endo/Other    Renal/GU      Musculoskeletal   Abdominal   Peds  Hematology  (+) anemia ,   Anesthesia Other Findings Obese. Takes xarelto. Hb 7.3. Increased INR. EKG ok.  Reproductive/Obstetrics                             Anesthesia Physical Anesthesia Plan  ASA: III  Anesthesia Plan: General   Post-op Pain Management:    Induction: Intravenous  PONV Risk Score and Plan:   Airway Management Planned:   Additional Equipment:   Intra-op Plan:   Post-operative Plan:   Informed Consent: I have reviewed the patients History and Physical, chart, labs and discussed the procedure including the risks, benefits and alternatives for the proposed anesthesia with the patient or authorized representative who has indicated his/her understanding and acceptance.       Plan Discussed with: CRNA  Anesthesia Plan Comments:         Anesthesia Quick Evaluation

## 2018-07-02 NOTE — Transfer of Care (Signed)
Immediate Anesthesia Transfer of Care Note  Patient: Jasmine [name]  Procedure(s) Performed: ESOPHAGOGASTRODUODENOSCOPY (EGD) WITH PROPOFOL (N/A ) COLONOSCOPY WITH PROPOFOL (N/A )  Patient Location: PACU  Anesthesia Type:General  Level of Consciousness: awake and sedated  Airway & Oxygen Therapy: Patient Spontanous Breathing and Patient connected to nasal cannula oxygen  Post-op Assessment: Report given to RN and Post -op Vital signs reviewed and stable  Post vital signs: Reviewed and stable  Last Vitals:  Vitals Value Taken Time  BP 112/75 07/02/2018 12:56 PM  Temp 36.1 C 07/02/2018 12:53 PM  Pulse 66 07/02/2018  1:01 PM  Resp 19 07/02/2018  1:01 PM  SpO2 100 % 07/02/2018  1:01 PM  Vitals shown include unvalidated device data.  Last Pain:  Vitals:   07/02/18 1253  TempSrc: Tympanic  PainSc: Asleep         Complications: No apparent anesthesia complications

## 2018-07-03 ENCOUNTER — Telehealth: Payer: Self-pay

## 2018-07-03 ENCOUNTER — Other Ambulatory Visit: Payer: Self-pay

## 2018-07-03 DIAGNOSIS — D508 Other iron deficiency anemias: Secondary | ICD-10-CM

## 2018-07-03 LAB — HEMATOCRIT: HCT: 34.2 % — ABNORMAL LOW (ref 36.0–46.0)

## 2018-07-03 LAB — HIV ANTIBODY (ROUTINE TESTING W REFLEX): HIV Screen 4th Generation wRfx: NONREACTIVE

## 2018-07-03 LAB — ANTI-PARIETAL ANTIBODY: Parietal Cell Antibody-IgG: 1.4 Units (ref 0.0–20.0)

## 2018-07-03 LAB — INTRINSIC FACTOR ANTIBODIES: Intrinsic Factor: 1 AU/mL (ref 0.0–1.1)

## 2018-07-03 LAB — HEMOGLOBIN
Hemoglobin: 7.2 g/dL — ABNORMAL LOW (ref 12.0–15.0)
Hemoglobin: 9.6 g/dL — ABNORMAL LOW (ref 12.0–15.0)

## 2018-07-03 LAB — PREPARE RBC (CROSSMATCH)

## 2018-07-03 MED ORDER — CYANOCOBALAMIN 1000 MCG/ML IJ SOLN
1000.0000 ug | Freq: Once | INTRAMUSCULAR | Status: AC
  Administered 2018-07-03: 11:00:00 1000 ug via INTRAMUSCULAR
  Filled 2018-07-03: qty 1

## 2018-07-03 MED ORDER — ACETAMINOPHEN 325 MG PO TABS
650.0000 mg | ORAL_TABLET | Freq: Once | ORAL | Status: AC
  Administered 2018-07-03: 650 mg via ORAL
  Filled 2018-07-03: qty 2

## 2018-07-03 MED ORDER — SODIUM CHLORIDE 0.9% IV SOLUTION
Freq: Once | INTRAVENOUS | Status: AC
  Administered 2018-07-03: 11:00:00 via INTRAVENOUS

## 2018-07-03 NOTE — Telephone Encounter (Signed)
I spoke with [facility] regarding her referral for small bowel caps study that Dr. Maximino Greenland would like to have done ASAP. They need to talk with pt and will contact her for an appt. Dr. Maximino Greenland will let pt know as she remains in the hospital.

## 2018-07-03 NOTE — Progress Notes (Addendum)
[facility] [place] at [facility]   PATIENT NAME: Jasmine [name]    MR#:  [id number]  DATE OF BIRTH:  09/07/1960  SUBJECTIVE:  CHIEF COMPLAINT:   Chief Complaint  Patient presents with  . Nausea  . Abdominal Pain   The patient has no complaints. REVIEW OF SYSTEMS:  Review of Systems  Constitutional: Negative for chills, fever and malaise/fatigue.  HENT: Negative for sore throat.   Eyes: Negative for blurred vision and double vision.  Respiratory: Negative for cough, hemoptysis, shortness of breath, wheezing and stridor.   Cardiovascular: Negative for chest pain, palpitations, orthopnea and leg swelling.  Gastrointestinal: Negative for abdominal pain, blood in stool, diarrhea, melena, nausea and vomiting.  Genitourinary: Negative for dysuria, flank pain and hematuria.  Musculoskeletal: Negative for back pain and joint pain.  Skin: Negative for rash.  Neurological: Negative for dizziness, sensory change, focal weakness, seizures, loss of consciousness, weakness and headaches.  Endo/Heme/Allergies: Negative for polydipsia.  Psychiatric/Behavioral: Negative for depression. The patient is not nervous/anxious.     DRUG ALLERGIES:   Allergies  Allergen Reactions  . Tape Itching, Dermatitis and Rash    Transpore tape  . Codeine   . Shellfish Allergy    VITALS:  Blood pressure (!) 155/80, pulse 64, temperature 98.4 F (36.9 C), temperature source Oral, resp. rate 16, height 5\' 3"  (1.6 m), weight 91 kg, SpO2 98 %. PHYSICAL EXAMINATION:  Physical Exam Constitutional:      General: She is not in acute distress.    Appearance: Normal appearance. She is obese.  HENT:     Head: Normocephalic.     Mouth/Throat:     Mouth: Mucous membranes are moist.  Eyes:     General: No scleral icterus.    Conjunctiva/sclera: Conjunctivae normal.     Pupils: Pupils are equal, round, and reactive to light.  Neck:     Musculoskeletal: Normal range of motion and neck  supple.     Vascular: No JVD.     Trachea: No tracheal deviation.  Cardiovascular:     Rate and Rhythm: Normal rate and regular rhythm.     Heart sounds: Normal heart sounds. No murmur. No gallop.   Pulmonary:     Effort: Pulmonary effort is normal. No respiratory distress.     Breath sounds: Normal breath sounds. No wheezing or rales.  Abdominal:     General: Bowel sounds are normal. There is no distension.     Palpations: Abdomen is soft.     Tenderness: There is no abdominal tenderness. There is no rebound.  Musculoskeletal: Normal range of motion.        General: No tenderness.     Right lower leg: No edema.     Left lower leg: No edema.  Skin:    Coloration: Skin is not jaundiced or pale.     Findings: No bruising, erythema or rash.  Neurological:     General: No focal deficit present.     Mental Status: She is alert and oriented to person, place, and time.     Cranial Nerves: No cranial nerve deficit.  Psychiatric:        Mood and Affect: Mood normal.    LABORATORY PANEL:  Female CBC Recent Labs  Lab 07/01/18 0533  07/03/18 1546  WBC 8.0  --   --   HGB 7.4*   < > 9.6*  HCT 27.9*  --  34.2*  PLT 293  --   --    < > =  values in this interval not displayed.   ------------------------------------------------------------------------------------------------------------------ Chemistries  Recent Labs  Lab 06/30/18 1600 07/01/18 0533  NA 143 143  K 3.6 3.6  CL 108 109  CO2 25 25  GLUCOSE 131* 94  BUN 11 10  CREATININE 0.60 0.60  CALCIUM 9.0 8.7*  AST 17  --   ALT 12  --   ALKPHOS 71  --   BILITOT 1.1  --    RADIOLOGY:  No results found. ASSESSMENT AND PLAN:   Jasmine [name]  is a 58 y.o. 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  1.  Acute on chronic anemia-concern for GI  causes.  Stool for guaiac was positive in the emergency room. -Patient denies any active bleeding.   -Last dose of Xarelto was on 06/29/2018 evening.  Hold Xarelto. S?P 1 unit of packed RBC, Hb 7.3.  EGD: Large hiatal hernia, need follow up surgeon as outpatient;  Colonoscopy: Normal. Small bowel capsule study if Hb continues to drop. She was given PPI, IV iron, parenteral B12 replacement. Follow up intrinsic factor and antiparietal cell antibody with hematologist.  The patient's hemoglobin decreased to 7.2.  Per Dr. Maximino Greenland agrees to get PRBC transfusion 1 unit, small bowel capsule study set up as outpatient if hemoglobin is stable tomorrow. Vitamin B 12 1 dose today per Dr. Smith Robert. 2.  GERD symptoms-started on IV Protonix  3.  History of cerebral venous sinus thrombosis-Xarelto is hold for now given anemia and possible GI bleed.  Follow up Dr. Smith Robert as outpatient.  4.  DVT prophylaxis-teds and SCDs. I discussed with Dr. Maximino Greenland and Dr. Smith Robert. All the records are reviewed and case discussed with Care Management/Social Worker. Management plans discussed with the patient, family and they are in agreement.  CODE STATUS: Full Code  TOTAL TIME TAKING CARE OF THIS PATIENT: 28 minutes.   More than 50% of the time was spent in counseling/coordination of care: YES  POSSIBLE D/C IN 1-2 DAYS, DEPENDING ON CLINICAL CONDITION.   Shaune Pollack M.D on 07/03/2018 at 4:34 PM  Between 7am to 6pm - Pager - [phone]  After 6pm go to [email or web address] - Therapist, nutritional Hospitalists

## 2018-07-03 NOTE — Progress Notes (Signed)
Small bowel capsule is not available at [facility] at this time. We contacted [facility] who are able to schedule this as an outpt. We gave them the patient's information and they will contact her directly to set this up.  I have talked with the patient and she is agreeable with the plan.  As long as she does not have any active GI bleeding and hemoglobin improves and stabilizes, patient can have a small bowel capsule as an outpatient.  Otherwise this should be done as an inpatient with transfer to another facility.  This was discussed with Dr. Imogene Burn as well.

## 2018-07-04 ENCOUNTER — Telehealth: Payer: Self-pay | Admitting: Gastroenterology

## 2018-07-04 ENCOUNTER — Other Ambulatory Visit: Payer: Self-pay | Admitting: Oncology

## 2018-07-04 DIAGNOSIS — D509 Iron deficiency anemia, unspecified: Secondary | ICD-10-CM | POA: Insufficient documentation

## 2018-07-04 LAB — BPAM RBC
Blood Product Expiration Date: 202006062359
Blood Product Expiration Date: 202006072359
ISSUE DATE / TIME: 202005161916
ISSUE DATE / TIME: 202005191115
Unit Type and Rh: 6200
Unit Type and Rh: 6200

## 2018-07-04 LAB — TYPE AND SCREEN
ABO/RH(D): A POS
Antibody Screen: NEGATIVE
Unit division: 0
Unit division: 0

## 2018-07-04 LAB — HEMOGLOBIN: Hemoglobin: 8.7 g/dL — ABNORMAL LOW (ref 12.0–15.0)

## 2018-07-04 MED ORDER — PANTOPRAZOLE SODIUM 40 MG PO TBEC: 40.0000 mg | DELAYED_RELEASE_TABLET | Freq: Every day | ORAL | 0 refills | Status: DC

## 2018-07-04 NOTE — Telephone Encounter (Signed)
Sheri are we doing capsule endoscopy yet?

## 2018-07-04 NOTE — Telephone Encounter (Signed)
The pt states she believes has been scheduled with [facility].  She will call her PCP and confirm and call back if we are needed.

## 2018-07-04 NOTE — Plan of Care (Signed)
Nutrition Education Note  RD received consult per patient request.  Spoke with patient over the phone. She is requesting nutrition information regarding her hiatal hernia. Plan is to discharge today.  There is no approved handout for hiatal hernia nutrition therapy, but it is very similar to GERD nutrition therapy. RD reviewed "GERD Nutrition Therapy" handout from the Academy of Nutrition and Dietetics. Will mail handout to patient.Encouraged patient to eat 4-6 small, frequent meals and snacks throughout the day. Encouraged intake of a protein food at each meal and snack. Discussed limiting foods that may trigger heartburn and provided examples. Encouraged patient to avoid laying down right after eating or eating too late in the day. Used teach-back.  Expect good compliance  Body mass index is 35.54 kg/m. Pt meets criteria for obesity class II based on current BMI.   Current diet order is heart healthy, patient is consuming approximately 100% of meals at this time. Labs and medications reviewed. No further nutrition interventions warranted at this time. RD contact information provided. If additional nutrition issues arise, please re-consult RD.  Helane Rima, MS, RD, LDN Office: [phone] Pager: [phone] After Hours/Weekend Pager: [phone]

## 2018-07-04 NOTE — Discharge Summary (Signed)
[facility] - [place] at [facility]   PATIENT NAME: Jasmine [name]    MR#:  [id number]  DATE OF BIRTH:  06/13/1960  DATE OF ADMISSION:  06/30/2018   ADMITTING PHYSICIAN: Enid Baas, MD  DATE OF DISCHARGE:  07/04/2018 PRIMARY CARE PHYSICIAN: Patient, No Pcp Per   ADMISSION DIAGNOSIS:  Abdominal pain [R10.9] Gastrointestinal hemorrhage, unspecified gastrointestinal hemorrhage type [K92.2] DISCHARGE DIAGNOSIS:  Active Problems:   Anemia   Hiatal hernia  SECONDARY DIAGNOSIS:   Past Medical History:  Diagnosis Date   H/O cerebral venous sinus thrombosis 2013   HOSPITAL COURSE:  KandeDixonis a58 y.o.femalewith a 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  1. Acute on chronic anemia-concern for GI causes. Stool for guaiac was positive in the emergency room. -Patient denies any active bleeding.  -Last dose of Xarelto was on 06/29/2018 evening. Hold Xarelto. S?P 1 unit of packed RBC, Hb 7.3. EGD: Large hiatal hernia, need follow up surgeon Dr. Everlene Farrier as outpatient;  Colonoscopy: Normal. Small bowel capsule study if Hb continues to drop. She was given PPI, IV iron, parenteral B12 replacement. Follow up intrinsic factor and antiparietal cell antibody with hematologist.  The patient's hemoglobin decreased to 7.2.  Per Dr. Maximino Greenland agrees to get PRBC transfusion 1 unit,  Hb 8.7. Small bowel capsule study set up as outpatient. Vitamin B 12 1 dose given yesterday per Dr. Smith Robert.  2. GERD symptoms-started on IV Protonix, changed to po daily.  3. History of cerebral venous sinus thrombosis-Xarelto is hold for now given anemia and possible GI bleed.  Follow up Dr. Smith Robert as outpatient.  4. DVT prophylaxis-teds and SCDs. I discussed with Dr. Maximino Greenland and Dr. Smith Robert. DISCHARGE CONDITIONS:    Stable, discharge to home today. CONSULTS OBTAINED:  Treatment Team:  Creig Hines, MD DRUG ALLERGIES:   Allergies  Allergen Reactions   Tape Itching, Dermatitis and Rash    Transpore tape   Codeine    Shellfish Allergy    DISCHARGE MEDICATIONS:   Allergies as of 07/04/2018      Reactions   Tape Itching, Dermatitis, Rash   Transpore tape   Codeine    Shellfish Allergy       Medication List    STOP taking these medications   rivaroxaban 20 MG Tabs tablet Commonly known as:  XARELTO     TAKE these medications   pantoprazole 40 MG tablet Commonly known as:  PROTONIX Take 1 tablet (40 mg total) by mouth daily.        DISCHARGE INSTRUCTIONS:  See AVS. If you experience worsening of your admission symptoms, develop shortness of breath, life threatening emergency, suicidal or homicidal thoughts you must seek medical attention immediately by calling 911 or calling your MD immediately  if symptoms less severe.  You Must read complete instructions/literature along with all the possible adverse reactions/side effects for all the Medicines you take and that have been prescribed to you. Take any new Medicines after you have completely understood and accpet all the possible adverse reactions/side effects.   Please note  You were cared for by a hospitalist during your hospital stay. If you have any questions about your discharge medications or the care you received while you were in the hospital after you are discharged, you can call the unit and asked to speak with the hospitalist on call  if the hospitalist that took care of you is not available. Once you are discharged, your primary care physician will handle any further medical issues. Please note that NO REFILLS for any discharge medications will be authorized once you are discharged, as it is imperative that you return to your primary care physician (or establish a relationship with a primary care physician if you do not  have one) for your aftercare needs so that they can reassess your need for medications and monitor your lab values.    On the day of Discharge:  VITAL SIGNS:  Blood pressure (!) 163/78, pulse 66, temperature 98.6 F (37 C), resp. rate 18, height 5\' 3"  (1.6 m), weight 91 kg, SpO2 97 %. PHYSICAL EXAMINATION:  GENERAL:  58 y.o.-year-old patient lying in the bed with no acute distress.  Obesity. EYES: Pupils equal, round, reactive to light and accommodation. No scleral icterus. Extraocular muscles intact.  HEENT: Head atraumatic, normocephalic. Oropharynx and nasopharynx clear.  NECK:  Supple, no jugular venous distention. No thyroid enlargement, no tenderness.  LUNGS: Normal breath sounds bilaterally, no wheezing, rales,rhonchi or crepitation. No use of accessory muscles of respiration.  CARDIOVASCULAR: S1, S2 normal. No murmurs, rubs, or gallops.  ABDOMEN: Soft, non-tender, non-distended. Bowel sounds present. No organomegaly or mass.  EXTREMITIES: No pedal edema, cyanosis, or clubbing.  NEUROLOGIC: Cranial nerves II through XII are intact. Muscle strength 5/5 in all extremities. Sensation intact. Gait not checked.  PSYCHIATRIC: The patient is alert and oriented x 3.  SKIN: No obvious rash, lesion, or ulcer.  DATA REVIEW:   CBC Recent Labs  Lab 07/01/18 0533  07/03/18 1546 07/04/18 0418  WBC 8.0  --   --   --   HGB 7.4*   < > 9.6* 8.7*  HCT 27.9*  --  34.2*  --   PLT 293  --   --   --    < > = values in this interval not displayed.    Chemistries  Recent Labs  Lab 06/30/18 1600 07/01/18 0533  NA 143 143  K 3.6 3.6  CL 108 109  CO2 25 25  GLUCOSE 131* 94  BUN 11 10  CREATININE 0.60 0.60  CALCIUM 9.0 8.7*  AST 17  --   ALT 12  --   ALKPHOS 71  --   BILITOT 1.1  --      Microbiology Results  Results for orders placed or performed during the hospital encounter of 06/30/18  SARS Coronavirus 2 (CEPHEID - Performed in [facility] hospital lab), Hosp Order     Status:  None   Collection Time: 06/30/18  7:06 PM  Result Value Ref Range Status   SARS Coronavirus 2 NEGATIVE NEGATIVE Final    Comment: (NOTE) If result is NEGATIVE SARS-CoV-2 target nucleic acids are NOT DETECTED. The SARS-CoV-2 RNA is generally detectable in upper and lower  respiratory specimens during the acute phase of infection. The lowest  concentration of SARS-CoV-2 viral copies this assay can detect is 250  copies / mL. A negative result does not preclude SARS-CoV-2 infection  and should not be used as the sole basis for treatment or other  patient management decisions.  A negative result may occur with  improper specimen collection / handling, submission of specimen other  than nasopharyngeal swab, presence of viral mutation(s) within the  areas targeted by this assay, and inadequate number of viral copies  (<250 copies / mL). A negative result must be combined with clinical  observations,  patient history, and epidemiological information. If result is POSITIVE SARS-CoV-2 target nucleic acids are DETECTED. The SARS-CoV-2 RNA is generally detectable in upper and lower  respiratory specimens dur ing the acute phase of infection.  Positive  results are indicative of active infection with SARS-CoV-2.  Clinical  correlation with patient history and other diagnostic information is  necessary to determine patient infection status.  Positive results do  not rule out bacterial infection or co-infection with other viruses. If result is PRESUMPTIVE POSTIVE SARS-CoV-2 nucleic acids MAY BE PRESENT.   A presumptive positive result was obtained on the submitted specimen  and confirmed on repeat testing.  While 2019 novel coronavirus  (SARS-CoV-2) nucleic acids may be present in the submitted sample  additional confirmatory testing may be necessary for epidemiological  and / or clinical management purposes  to differentiate between  SARS-CoV-2 and other Sarbecovirus currently known to infect  humans.  If clinically indicated additional testing with an alternate test  methodology [phone] is advised. The SARS-CoV-2 RNA is generally  detectable in upper and lower respiratory sp ecimens during the acute  phase of infection. The expected result is Negative. Fact Sheet for Patients:  BoilerBrush.com.cyhttps://www.fda.gov/media/136312/download Fact Sheet for Healthcare Providers: https://pope.com/https://www.fda.gov/media/136313/download This test is not yet approved or cleared by the [place] FDA and has been authorized for detection and/or diagnosis of SARS-CoV-2 by FDA under an Emergency Use Authorization (EUA).  This EUA will remain in effect (meaning this test can be used) for the duration of the COVID-19 declaration under Section 564(b)(1) of the Act, 21 U.S.C. section 360bbb-3(b)(1), unless the authorization is terminated or revoked sooner. Performed at [facility], [address] [place], [place] [address]     RADIOLOGY:  No results found.   Management plans discussed with the patient, family and they are in agreement.  CODE STATUS: Full Code   TOTAL TIME TAKING CARE OF THIS PATIENT: 33 minutes.    Shaune PollackQing Letita Prentiss M.D on 07/04/2018 at 12:20 PM  Between 7am to 6pm - Pager - [phone]  After 6pm go to [email or web address] - Social research officer, governmentpassword EPAS [facility]  [facility] [place] [facility]  Office  [phone]  CC: Primary care physician; Patient, No Pcp Per   Note: This dictation was prepared with Dragon dictation along with smaller phrase technology. Any transcriptional errors that result from this process are unintentional.

## 2018-07-04 NOTE — Progress Notes (Addendum)
The patient has been discharged. The patient was educated about on medications and follow up appointments made. IV's removed.

## 2018-07-04 NOTE — Progress Notes (Signed)
FYI, FYI, hemoglobin dropped from being 9.6 yesterday and today is 9.6. She also indicates she wanted to talk about hiatal hernia with you and about a consult to a dietician.

## 2018-07-04 NOTE — Telephone Encounter (Signed)
DOD Dr. Myrtie Neither 07/04/18  Urgent referral from Dr. Maximino Greenland for pt for iron-deficiency anemia--to have a small bowel capsule.  Pt needs to restart anticoagulant so requested to be scheduled this week.

## 2018-07-04 NOTE — Telephone Encounter (Signed)
Yes after 6/1

## 2018-07-05 ENCOUNTER — Inpatient Hospital Stay: Payer: Self-pay

## 2018-07-05 ENCOUNTER — Telehealth: Payer: Self-pay | Admitting: Gastroenterology

## 2018-07-05 ENCOUNTER — Telehealth: Payer: Self-pay

## 2018-07-05 ENCOUNTER — Other Ambulatory Visit: Payer: Self-pay

## 2018-07-05 DIAGNOSIS — D508 Other iron deficiency anemias: Secondary | ICD-10-CM

## 2018-07-05 NOTE — Telephone Encounter (Signed)
Dr. Tawni Carnes has called me , having changed her mind on method and timing of capsule study.  She also stated patient reports difficulty swallowing large pills and doesn't think she can swallow a capsule.  So she wants it to be an EGD with capsule delivery.  Dr. Karie Schwalbe also now feels the capsule could be done within next two weeks.  This significantly changes our plans.  I would rather not burden our inpatient consult physician with this task, especially given our high consult census.  I will be on for hospital backup next Wed AM 5/27.  Please see if there is a slot in [facility] endo to do that ( not that I currently have an early SB enteroscopy at [facility], if that patient does not cancel it - please check with TT on that).  Since it would be EGD to deliver capsule, the patient would no longer need the reglan I recommended in the original plan.

## 2018-07-05 NOTE — Telephone Encounter (Signed)
Dr Myrtie Neither [facility] Dr Maximino Greenland messaged me regarding this patients need for capsule endo.  That office is unable to do the capsule due to equipment failure.  The patient is waiting for be restarted on anticoag therapy after capsule is completed.  Per Lavonna Rua we are not scheduling capsules until after June 1.  Is it ok to tentatively schedule this pt under your name?

## 2018-07-05 NOTE — Telephone Encounter (Signed)
I called to update the patient about the plans for small bowel capsule today with [facility], after I spoke to Dr. Myrtie Neither at length.  Patient now states she is anxious that she will not be able to swallow the pill camera as she has a sensitive gag reflex, and has trouble swallowing her Xarelto as well which is as she quotes is a "tiny pill".  I explained to her in detail that typically the Endo staff is able to help her and instruct her on how to swallow the pill camera safely and can talk her through it.  However, she still remains anxious and states she may not be able to complete the study as she is afraid to swallow the pill.  I did discuss with her that placing the small bowel capsule endoscopically is another option, but can delay her study due to schedule availability, and therefore delay restarting her anticoagulation.  She specifically says she understands this risk, but prefers to have the capsule study done endoscopically and would rather not swallow the pill camera and is afraid she is not going to be able to swallow it.  I have discussed the above with Dr. Myrtie Neither and now there or 2 reasons to make an endoscopically placed small bowel capsule more desirable in this case.  1. Pt may not be able to swallow the pill camera, requiring [place] to reschedule the procedure to be done endoscopically, thus delaying her diagnosis further.  2.  Patient has a large hiatal hernia.  Although I do not think that this will cause the small bowel capsule to get stuck in the stomach, it can cause the pill camera to stay in the stomach longer and thus spend less time in the small bowel.  Therefore, due to the above reasons, endoscopically placed small bowel capsule would be best in this setting.  Patient has been informed to get her hemoglobin checked again tomorrow.  I have ordered this.  If this remains stable or is improving, we can proceed with outpatient small bowel capsule placement with endoscopy as  planned.  However, if this is worsening, we may have to consider alternatives such as inpatient admission or more urgent outpatient capsule study.  I appreciate Dr. Myrtie Neither and [facility]'s assistance in getting this done, since the [employer] capsule equipment at our facility is still not in working condition as of today.  As always, management plans can change based on any change in symptoms or lab work as necessary.  I have informed Dr. Smith Robert from hematology about the above as well.  Please note, that the small bowel capsule study is just for iron deficiency anemia.  Patient has never had active GI bleeding.  She denies any hematochezia, melena, hematemesis prior to admission, during admission and after discharge.  I confirmed this with her today again.  An inpatient transfer for small bowel capsule study alone, is usually done in the setting of active GI bleeding, which was not present in this case.  In addition, patient had specifically asked to be discharged as she wanted to return to work today, and have her work-up done as an outpatient.  In addition, this patient is being evaluated in the setting of a COVID-19 pandemic.  Therefore, an outpatient small bowel capsule study was considered appropriate, given improving hemoglobin, no active GI bleeding, and for the appropriate utilization of medical resources.

## 2018-07-05 NOTE — Telephone Encounter (Signed)
The pt was advised of the appt on 5/27 at [facility] for EGD with capsule placement at 11 am..  Covid testing scheduled for 5/22 at [facility] between 9-3 pm

## 2018-07-05 NOTE — Telephone Encounter (Signed)
I reviewed some recent inpatient records on this patient and spoke with Dr. Tawni Carnes extensively today.  We are unable to arrange a video capsule study in our office tomorrow.  Even if that had been possible, the type of capsule system we have requires the patient to retrieve the capsule, mail it in in the company to upload the images, which means then at least 7 to 10 days before images can be reviewed and a report generated. Therefore, it is my opinion the study should be done on the Given capsule system which is in the hospital endoscopy lab.  That study can be reviewed and a report generated the following day.  I spoke with Dr.Tahliani about the upper endoscopy findings of a large hiatal hernia, and questioned whether this capsule needed to be delivered endoscopically.  She did not feel that was necessary. I also asked whether a video capsule study next week with soon enough, given this patient's complex clinical scenario and need to resume oral anticoagulation.  If it was needed sooner than that, the patient would require admission to [facility].  Dr. Tawni Carnes felt the video capsule study would wait until next week.  She also agreed to make arrangements to check the patient's CBC before then.  I offered to try our best to arrange an outpatient video capsule study for next Tuesday, 07/10/2018 at the [facility] outpatient endoscopy lab. The patient would need to be contacted today and given all instructions by our office.  Although this patient tested negative for COVID when she was admitted to [facility] on 06/30/2018, the [facility] outpatient endoscopy lab policy now requires that she be retested because she has since been discharged.  This is time-sensitive, since her orders for capsule study would all need to be placed today including the preprocedure COVID testing, which would need to be done at the drive-through testing site tomorrow.  Please review usual capsule screening  questions with the patient.  As near as I can determine from her medical records, she does not have a pacemaker or defibrillator that would be a contraindication to capsule study. Lastly, this patient needs prescription for metoclopramide 5 mg, # 2 tablets with no refills.   One tablet would be taken 3 hours before capsule ingestion, another tablet 3 hours after capsule ingestion.

## 2018-07-05 NOTE — Telephone Encounter (Signed)
Pt notified of need to have lab work done tomorrow. Dr. Maximino Greenland ordered a Hgb. She had talked with [facility] regarding her small bowel caps study and they would like this done. Pt states her appt is not until July, they told her yesterday. I explained that we need to see if her hgb is stable as if not she may need something sooner. Pt prefers [facility] due to no insurance.

## 2018-07-05 NOTE — Telephone Encounter (Signed)
I communicated with Loretha Stapler, nurse at [facility] and explained the need for the small bowel capsule. She states they are starting these in June. I requested that her small bowel capsule be prioritized as her anticoagulation needs to be restarted based on this. She states she will make Dr. Myrtie Neither aware about this Jasmine [name] and will call the Jasmine [name] to schedule this. Jasmine [name] has a clinic appt with Dr. Tobi Bastos as well to follow up and ensure all needed studies are done.

## 2018-07-06 ENCOUNTER — Telehealth: Payer: Self-pay | Admitting: *Deleted

## 2018-07-06 ENCOUNTER — Other Ambulatory Visit (HOSPITAL_COMMUNITY)
Admission: RE | Admit: 2018-07-06 | Discharge: 2018-07-06 | Disposition: A | Discharge status: Home / Self Care | Payer: HRSA Program | Source: Ambulatory Visit | Attending: Gastroenterology | Admitting: Gastroenterology

## 2018-07-06 ENCOUNTER — Other Ambulatory Visit: Payer: Self-pay | Admitting: Gastroenterology

## 2018-07-06 ENCOUNTER — Other Ambulatory Visit
Admission: RE | Admit: 2018-07-06 | Discharge: 2018-07-06 | Disposition: A | Discharge status: Home / Self Care | Payer: MEDICAID | Attending: Gastroenterology | Admitting: Gastroenterology

## 2018-07-06 DIAGNOSIS — Z1159 Encounter for screening for other viral diseases: Secondary | ICD-10-CM | POA: Diagnosis not present

## 2018-07-06 DIAGNOSIS — Z01812 Encounter for preprocedural laboratory examination: Secondary | ICD-10-CM | POA: Diagnosis present

## 2018-07-06 DIAGNOSIS — D509 Iron deficiency anemia, unspecified: Secondary | ICD-10-CM

## 2018-07-06 LAB — HEMOGLOBIN AND HEMATOCRIT, BLOOD
HCT: 35.4 % — ABNORMAL LOW (ref 36.0–46.0)
Hemoglobin: 9.8 g/dL — ABNORMAL LOW (ref 12.0–15.0)

## 2018-07-06 LAB — HEMOGLOBIN: Hemoglobin: 9.8 g/dL — ABNORMAL LOW (ref 12.0–15.0)

## 2018-07-06 NOTE — Progress Notes (Signed)
Hemoglobin A1C was entered in error. Changed to Hemoglobin instead.

## 2018-07-06 NOTE — Telephone Encounter (Signed)
Patient called stating she had labs this morning and depending on thise results, she will have a procedure at [facility] next week and will have to go get a COVID test today in [place] today. She wants to know if she needs to go to [facility] and if she will need the procedure next week. She is unsure if Dr Smith Robert or GI doc ordered labs. Pleas return her call [phone].  I checked chart and find notes=d that Dr Talhini's office had ordered for hgb to be drawn and then see that hospital lab drew a Hgb A1C. I told patient she needs to call the on call doctor with GI and get this straightened out as the ofice is closed today

## 2018-07-07 LAB — NOVEL CORONAVIRUS, NAA (HOSP ORDER, SEND-OUT TO REF LAB; TAT 18-24 HRS): SARS-CoV-2, NAA: NOT DETECTED

## 2018-07-10 ENCOUNTER — Encounter: Payer: Self-pay | Admitting: Pharmacy Technician

## 2018-07-10 ENCOUNTER — Encounter (HOSPITAL_COMMUNITY): Payer: Self-pay | Admitting: *Deleted

## 2018-07-10 ENCOUNTER — Other Ambulatory Visit: Payer: Self-pay

## 2018-07-10 NOTE — Progress Notes (Signed)
Spoke with pt for pre-op call. Pt denies cardiac history, HTN or Diabetes. 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   EKG- 06/30/18  Pt had Covid 19 test done on 07/06/18 - negative Pt states she has self guarantined since testing.    Coronavirus Screening  Have you experienced the following symptoms:  Cough NO Fever (>100.37F)  NO Runny nose NO Sore throat NO Difficulty breathing/shortness of breath  NO  Have you or a family member traveled in the last 14 days and where? NO   Please remind your patients and families that hospital visitation restrictions are in effect and the importance of the restrictions.

## 2018-07-10 NOTE — Progress Notes (Signed)
Attempted to call patient prior to procedure, unable to reach patient on the phone at this time.

## 2018-07-10 NOTE — Progress Notes (Signed)
Patient has been approved for drug assistance by [employer] for Feraheme. The enrollment period is from 07/06/18-07/05/19 based on self pay. First DOS covered is 07/18/18.

## 2018-07-11 ENCOUNTER — Ambulatory Visit (HOSPITAL_COMMUNITY): Payer: Self-pay | Admitting: Anesthesiology

## 2018-07-11 ENCOUNTER — Encounter: Payer: Self-pay | Admitting: Gastroenterology

## 2018-07-11 ENCOUNTER — Inpatient Hospital Stay: Payer: Self-pay

## 2018-07-11 ENCOUNTER — Encounter (HOSPITAL_COMMUNITY): Payer: Self-pay

## 2018-07-11 ENCOUNTER — Ambulatory Visit (HOSPITAL_COMMUNITY)
Admission: RE | Admit: 2018-07-11 | Discharge: 2018-07-11 | Disposition: A | Discharge status: Home / Self Care | Payer: Self-pay | Attending: Gastroenterology | Admitting: Gastroenterology

## 2018-07-11 ENCOUNTER — Encounter (HOSPITAL_COMMUNITY)
Admission: RE | Disposition: A | Discharge status: Home / Self Care | Payer: Self-pay | Source: Home / Self Care | Attending: Gastroenterology

## 2018-07-11 DIAGNOSIS — D508 Other iron deficiency anemias: Secondary | ICD-10-CM

## 2018-07-11 DIAGNOSIS — M19012 Primary osteoarthritis, left shoulder: Secondary | ICD-10-CM | POA: Insufficient documentation

## 2018-07-11 DIAGNOSIS — K449 Diaphragmatic hernia without obstruction or gangrene: Secondary | ICD-10-CM | POA: Insufficient documentation

## 2018-07-11 DIAGNOSIS — K3189 Other diseases of stomach and duodenum: Secondary | ICD-10-CM

## 2018-07-11 DIAGNOSIS — D509 Iron deficiency anemia, unspecified: Secondary | ICD-10-CM | POA: Insufficient documentation

## 2018-07-11 HISTORY — DX: Personal history of other diseases of the digestive system: Z87.19

## 2018-07-11 HISTORY — DX: Anemia, unspecified: D64.9

## 2018-07-11 HISTORY — PX: ESOPHAGOGASTRODUODENOSCOPY (EGD) WITH PROPOFOL: SHX5813

## 2018-07-11 HISTORY — DX: Unspecified osteoarthritis, unspecified site: M19.90

## 2018-07-11 HISTORY — PX: GIVENS CAPSULE STUDY: SHX5432

## 2018-07-11 HISTORY — DX: Personal history of urinary calculi: Z87.442

## 2018-07-11 SURGERY — ESOPHAGOGASTRODUODENOSCOPY (EGD) WITH PROPOFOL
Anesthesia: Monitor Anesthesia Care

## 2018-07-11 MED ORDER — SODIUM CHLORIDE 0.9 % IV SOLN: INTRAVENOUS | Status: DC

## 2018-07-11 MED ORDER — PROPOFOL 500 MG/50ML IV EMUL
INTRAVENOUS | Status: DC | PRN
  Administered 2018-07-11: 175 ug/kg/min via INTRAVENOUS

## 2018-07-11 MED ORDER — ONDANSETRON HCL 4 MG/2ML IJ SOLN
INTRAMUSCULAR | Status: DC | PRN
  Administered 2018-07-11: 4 mg via INTRAVENOUS

## 2018-07-11 MED ORDER — LACTATED RINGERS IV SOLN
INTRAVENOUS | Status: DC
  Administered 2018-07-11: 11:00:00 via INTRAVENOUS

## 2018-07-11 MED ORDER — PROPOFOL 10 MG/ML IV BOLUS
INTRAVENOUS | Status: DC | PRN
  Administered 2018-07-11: 100 mg via INTRAVENOUS

## 2018-07-11 SURGICAL SUPPLY — 15 items
BLOCK BITE 60FR ADLT L/F BLUE (MISCELLANEOUS) ×3 IMPLANT
ELECT REM PT RETURN 9FT ADLT (ELECTROSURGICAL)
ELECTRODE REM PT RTRN 9FT ADLT (ELECTROSURGICAL) IMPLANT
FORCEP RJ3 GP 1.8X160 W-NEEDLE (CUTTING FORCEPS) IMPLANT
FORCEPS BIOP RAD 4 LRG CAP 4 (CUTTING FORCEPS) IMPLANT
NEEDLE SCLEROTHERAPY 25GX240 (NEEDLE) IMPLANT
PROBE APC STR FIRE (PROBE) IMPLANT
PROBE INJECTION GOLD (MISCELLANEOUS)
PROBE INJECTION GOLD 7FR (MISCELLANEOUS) IMPLANT
SNARE SHORT THROW 13M SML OVAL (MISCELLANEOUS) IMPLANT
SYR 50ML LL SCALE MARK (SYRINGE) IMPLANT
TOWEL COTTON PACK 4EA (MISCELLANEOUS) ×6 IMPLANT
TUBING ENDO SMARTCAP PENTAX (MISCELLANEOUS) ×6 IMPLANT
TUBING IRRIGATION ENDOGATOR (MISCELLANEOUS) ×3 IMPLANT
WATER STERILE IRR 1000ML POUR (IV SOLUTION) IMPLANT

## 2018-07-11 NOTE — Anesthesia Procedure Notes (Signed)
Procedure Name: MAC Date/Time: 07/11/2018 11:05 AM Performed by: Lowella Dell, CRNA Pre-anesthesia Checklist: Patient identified, Emergency Drugs available, Suction available, Patient being monitored and Timeout performed Patient Re-evaluated:Patient Re-evaluated prior to induction Oxygen Delivery Method: Nasal cannula Induction Type: IV induction Ventilation: Oral airway inserted - appropriate to patient size Placement Confirmation: positive ETCO2 Dental Injury: Teeth and Oropharynx as per pre-operative assessment

## 2018-07-11 NOTE — H&P (Signed)
  History:  This patient presents for endoscopic testing for iron deficiency anemia (no source on egd/colon last week at outside institution).  [name] Referring physician:  Tawni Carnes (GI - [facility])  Past Medical History: Past Medical History:  Diagnosis Date  . Anemia   . Arthritis    LEFT SHOULDER  . H/O cerebral venous sinus thrombosis 2013  . History of hiatal hernia   . History of kidney stones      Past Surgical History: Past Surgical History:  Procedure Laterality Date  . COLONOSCOPY WITH PROPOFOL N/A 07/02/2018   Procedure: COLONOSCOPY WITH PROPOFOL;  Surgeon: Pasty Spillers, MD;  Location: [facility] ENDOSCOPY;  Service: Endoscopy;  Laterality: N/A;  . ESOPHAGOGASTRODUODENOSCOPY N/A 2001   approximately- for Reflux  . ESOPHAGOGASTRODUODENOSCOPY (EGD) WITH PROPOFOL N/A 07/02/2018   Procedure: ESOPHAGOGASTRODUODENOSCOPY (EGD) WITH PROPOFOL;  Surgeon: Pasty Spillers, MD;  Location: [facility] ENDOSCOPY;  Service: Endoscopy;  Laterality: N/A;    Allergies: Allergies  Allergen Reactions  . Food     Pumpkins/squash/zucchini (gourds foods) swelling of the face  . Tape Itching, Dermatitis and Rash    Transpore tape  . Codeine Other (See Comments)    Crawling skin  . Iodine Other (See Comments)    Unknown reaction type  . Shellfish Allergy Other (See Comments)    Crawling skin/stomach cramps.    Outpatient Meds: Current Facility-Administered Medications  Medication Dose Route Frequency Provider Last Rate Last Dose  . lactated ringers infusion   Intravenous Continuous Danis, Starr Lake III, MD          ___________________________________________________________________ Objective   Exam:  BP (!) 217/61   Pulse 63   Temp 98.2 F (36.8 C) (Oral)   Resp 14   Ht 5\' 3"  (1.6 m)   Wt 91 kg   SpO2 100%   BMI 35.54 kg/m    CV: RRR without murmur, S1/S2, no JVD, no peripheral edema  Resp: clear to auscultation bilaterally, normal RR and effort noted  GI: soft,  no tenderness, with active bowel sounds. No guarding or palpable organomegaly noted.  Neuro: awake, alert and oriented x 3. Normal gross motor function and fluent speech   Assessment:  IDA  Plan:  EGD with SB video capsule placement   [name]

## 2018-07-11 NOTE — Anesthesia Preprocedure Evaluation (Addendum)
Anesthesia Evaluation  Patient identified by MRN, date of birth, ID band Patient awake    Reviewed: Allergy & Precautions, H&P , NPO status , Patient's Chart, lab work & pertinent test results  Airway Mallampati: II  TM Distance: >3 FB Neck ROM: Full    Dental no notable dental hx. (+) Teeth Intact, Dental Advisory Given   Pulmonary neg pulmonary ROS,    Pulmonary exam normal breath sounds clear to auscultation       Cardiovascular negative cardio ROS   Rhythm:Regular Rate:Normal     Neuro/Psych negative neurological ROS  negative psych ROS   GI/Hepatic negative GI ROS, Neg liver ROS,   Endo/Other  negative endocrine ROS  Renal/GU negative Renal ROS  negative genitourinary   Musculoskeletal  (+) Arthritis , Osteoarthritis,    Abdominal   Peds  Hematology  (+) Blood dyscrasia, anemia ,   Anesthesia Other Findings   Reproductive/Obstetrics negative OB ROS                            Anesthesia Physical Anesthesia Plan  ASA: II  Anesthesia Plan: MAC   Post-op Pain Management:    Induction: Intravenous  PONV Risk Score and Plan: 2 and Propofol infusion and Ondansetron  Airway Management Planned: Nasal Cannula  Additional Equipment:   Intra-op Plan:   Post-operative Plan:   Informed Consent: I have reviewed the patients History and Physical, chart, labs and discussed the procedure including the risks, benefits and alternatives for the proposed anesthesia with the patient or authorized representative who has indicated his/her understanding and acceptance.     Dental advisory given  Plan Discussed with: CRNA  Anesthesia Plan Comments:         Anesthesia Quick Evaluation

## 2018-07-11 NOTE — Anesthesia Postprocedure Evaluation (Signed)
Anesthesia Post Note  Patient: Jasmine [name]  Procedure(s) Performed: ESOPHAGOGASTRODUODENOSCOPY (EGD) WITH PROPOFOL (N/A ) GIVENS CAPSULE STUDY (N/A )     Patient location during evaluation: PACU Anesthesia Type: MAC Level of consciousness: awake and alert Pain management: pain level controlled Vital Signs Assessment: post-procedure vital signs reviewed and stable Respiratory status: spontaneous breathing, nonlabored ventilation and respiratory function stable Cardiovascular status: stable and blood pressure returned to baseline Postop Assessment: no apparent nausea or vomiting Anesthetic complications: no    Last Vitals:  Vitals:   07/11/18 1145 07/11/18 1155  BP: (!) 144/57 (!) 159/59  Pulse: (!) 58 (!) 57  Resp: 15 13  Temp:    SpO2: 98% 98%    Last Pain:  Vitals:   07/11/18 1155  TempSrc:   PainSc: 0-No pain                 Jayshaun Phillips,W. EDMOND

## 2018-07-11 NOTE — Interval H&P Note (Signed)
History and Physical Interval Note:  07/11/2018 10:57 AM  Jasmine [name]  has presented today for surgery, with the diagnosis of IDA.  The various methods of treatment have been discussed with the patient and family. After consideration of risks, benefits and other options for treatment, the patient has consented to  Procedure(s) with comments: ESOPHAGOGASTRODUODENOSCOPY (EGD) WITH PROPOFOL (N/A) - PT TO HAVE CAPSULE PLACEMENT GIVENS CAPSULE STUDY (N/A) as a surgical intervention.  The patient's history has been reviewed, patient examined, no change in status, stable for surgery.  I have reviewed the patient's chart and labs.  Questions were answered to the patient's satisfaction.     [name]

## 2018-07-11 NOTE — Op Note (Signed)
[facility] Patient Name: Jasmine [name] Procedure Date : 07/11/2018 MRN: [id number] Attending MD: Estill Cotta. Loletha Carrow , MD Date of Birth: Nov 09, 1960 CSN: [id number] Age: 58 Admit Type: Outpatient Procedure:                Upper GI endoscopy Indications:              Iron deficiency anemia (no source on                            EGD/colonoscopy as inpatient last week at [facility]) Patient had been on [facility] until it                            was stopped over a week ago during that admission.                            Large hiatal hernia and reported dysphagia to pills                            prompted endoscopic delivery of small bowel video                            capsule device. Providers:                Estill Cotta. Loletha Carrow, MD, Grace Isaac, RN, Marguerita Merles, Technician, Charolette Child, Technician Referring MD:             Sindy Messing, MD [facility]) Medicines:                Monitored Anesthesia Care Complications:            No immediate complications. Estimated Blood Loss:     Estimated blood loss was minimal. Procedure:                Pre-Anesthesia Assessment:                           - Prior to the procedure, a History and Physical                            was performed, and patient medications and                            allergies were reviewed. The patient's tolerance of                            previous anesthesia was also reviewed. The risks                            and benefits of the procedure and the sedation  options and risks were discussed with the patient.                            All questions were answered, and informed consent                            was obtained. Prior Anticoagulants: The patient has                            taken no previous anticoagulant or antiplatelet                            agents. ASA Grade Assessment: III - A  patient with                            severe systemic disease. After reviewing the risks                            and benefits, the patient was deemed in                            satisfactory condition to undergo the procedure.                           After obtaining informed consent, the endoscope was                            passed under direct vision. Throughout the                            procedure, the patient's blood pressure, pulse, and                            oxygen saturations were monitored continuously. The                            GIF-H190 ([id number]) Olympus gastroscope was                            introduced through the mouth, and advanced to the                            second part of duodenum. The upper GI endoscopy was                            accomplished without difficulty. The patient                            tolerated the procedure well. Scope In: Scope Out: Findings:      The esophagus was normal.      A 10 cm hiatal hernia was present. Approximately half the stomach is       herniated into the chest.      Localized, striped erythematous mucosa was found in the  gastric body (at       location of diphragmatic indentation). No erosions, ulcerations, fresh       or old blood were seen in the entire stomach.      The cardia and gastric fundus were normal on retroflexion, within the       visualization limitations imposed by the hiatal hernia.      The examined duodenum was normal. After initial exam, the endoscope was       withdrawn, fitted with the capsule/delivery device, reinserted and       advanced to the duodenal bulb. Using the endoscope, the video capsule       was advanced into the duodenal bulb. Impression:               - Normal esophagus.                           - 10 cm hiatal hernia.                           - Erythematous mucosa in the gastric body.                           - Normal examined duodenum.                            - Successful completion of the Video Capsule                            placement.                           - No specimens collected. Moderate Sedation:      MAC sedation used Recommendation:           - Patient has a contact number available for                            emergencies. The signs and symptoms of potential                            delayed complications were discussed with the                            patient. Return to normal activities tomorrow.                            Written discharge instructions were provided to the                            patient.                           - Post-capsule ingestion dietary instructions given.                           - Continue present medications.                           -  Capsule study report will be sent to patient's                            primary GI doctor. Procedure Code(s):        --- Professional ---                           [phone], Esophagogastroduodenoscopy, flexible,                            transoral; diagnostic, including collection of                            specimen(s) by brushing or washing, when performed                            (separate procedure) Diagnosis Code(s):        --- Professional ---                           K44.9, Diaphragmatic hernia without obstruction or                            gangrene                           K31.89, Other diseases of stomach and duodenum                           D50.9, Iron deficiency anemia, unspecified CPT copyright 2019 American Medical Association. All rights reserved. The codes documented in this report are preliminary and upon coder review may  be revised to meet current compliance requirements. Janeka Libman L. Loletha Carrow, MD 07/11/2018 11:41:15 AM This report has been signed electronically. Number of Addenda: 0

## 2018-07-11 NOTE — Addendum Note (Signed)
Addendum  created 07/11/18 1245 by Demetrio Lapping, CRNA   Charge Capture section accepted

## 2018-07-11 NOTE — Transfer of Care (Signed)
Immediate Anesthesia Transfer of Care Note  Patient: Jasmine [name]  Procedure(s) Performed: ESOPHAGOGASTRODUODENOSCOPY (EGD) WITH PROPOFOL (N/A ) GIVENS CAPSULE STUDY (N/A )  Patient Location: PACU and Endoscopy Unit  Anesthesia Type:MAC  Level of Consciousness: awake and patient cooperative  Airway & Oxygen Therapy: Patient Spontanous Breathing and Patient connected to nasal cannula oxygen  Post-op Assessment: Report given to RN and Post -op Vital signs reviewed and stable  Post vital signs: Reviewed and stable  Last Vitals:  Vitals Value Taken Time  BP    Temp    Pulse    Resp    SpO2      Last Pain:  Vitals:   07/11/18 0909  TempSrc: Oral  PainSc: 0-No pain         Complications: No apparent anesthesia complications

## 2018-07-11 NOTE — Discharge Instructions (Signed)
YOU HAD AN ENDOSCOPIC PROCEDURE TODAY: Refer to the procedure report and other information in the discharge instructions given to you for any specific questions about what was found during the examination. If this information does not answer your questions, please call [facility] office at [phone] to clarify.   YOU SHOULD EXPECT: Some feelings of bloating in the abdomen. Passage of more gas than usual. Walking can help get rid of the air that was put into your GI tract during the procedure and reduce the bloating. If you had a lower endoscopy (such as a colonoscopy or flexible sigmoidoscopy) you may notice spotting of blood in your stool or on the toilet paper. Some abdominal soreness may be present for a day or two, also.  DIET: Your first meal following the procedure should be a light meal and then it is ok to progress to your normal diet. A half-sandwich or bowl of soup is an example of a good first meal. Heavy or fried foods are harder to digest and may make you feel nauseous or bloated. Drink plenty of fluids but you should avoid alcoholic beverages for 24 hours. If you had a esophageal dilation, please see attached instructions for diet.    ACTIVITY: Your care partner should take you home directly after the procedure. You should plan to take it easy, moving slowly for the rest of the day. You can resume normal activity the day after the procedure however YOU SHOULD NOT DRIVE, use power tools, machinery or perform tasks that involve climbing or major physical exertion for 24 hours (because of the sedation medicines used during the test).   SYMPTOMS TO REPORT IMMEDIATELY: A gastroenterologist can be reached at any hour. Please call [phone]  for any of the following symptoms:   Following upper endoscopy (EGD, EUS, ERCP, esophageal dilation) Vomiting of blood or coffee ground material  New, significant abdominal pain  New, significant chest pain or pain under the shoulder blades  Painful or  persistently difficult swallowing  New shortness of breath  Black, tarry-looking or red, bloody stools  FOLLOW UP:  If any biopsies were taken you will be contacted by phone or by letter within the next 1-3 weeks. Call [phone]  if you have not heard about the biopsies in 3 weeks.  Please also call with any specific questions about appointments or follow up tests. Capsule Endoscopy Capsule endoscopy is a procedure that is used to examine the inside of the small intestine. It is done to look for problems or abnormalities in the intestine. These problems may include bleeding, inflammation, or growths in the intestine. You will swallow a small capsule that has a tiny camera and transmitter in it. This capsule naturally travels through your digestive system. The camera takes photos of the small intestine along the way. The photos are transmitted to a recording device so that your health care provider can later view the photos and look for problems. Tell a health care provider about:  Any allergies you have.  All medicines you are taking, including vitamins, herbs, eye drops, creams, and over-the-counter medicines.  Any blood disorders you have.  Any surgeries or treatments you have had.  Any medical conditions you have.  Any history of intestinal blockage, Crohn disease, or ulcerative colitis.  Any history of swallowing disorders.  Smoking history.  Whether you have a pacemaker or implantable heart defibrillator.  Whether you are pregnant or may be pregnant. What are the risks? Generally, this is a safe procedure. However, problems may  occur, including:  The capsule becoming trapped in the digestive tract, resulting in intestinal blockage.  Fever.  Nausea and vomiting.  Problems swallowing.  Cramps and pain in the abdomen.  Failure to find a problem. What happens before the procedure? Eating and drinking restrictions Follow instructions from your health care provider  about eating and drinking. You may be asked to:  Switch to a clear liquid diet 24 hours before the procedure. Examples of clear liquids include water, broth, and gelatin. Drinking plenty of fluids during this time can help to prevent complications.  Stop eating and drinking completely 10 hours before the procedure. General instructions  Ask your health care provider about: ? Changing or stopping your regular medicines. This is especially important if you are taking diabetes medicines or blood thinners. ? Taking medicines such as aspirin and ibuprofen. These medicines can thin your blood. Do not take these medicines before your procedure if your health care provider instructs you not to.  Do not use any products that contain nicotine or tobacco, such as cigarettes and e-cigarettes. If you need help quitting, ask your health care provider.  You may be asked to follow a bowel preparation cleansing routine (bowel prep) to empty out your intestines. If the intestines are not adequately cleansed, the procedure may fail to get useful photos and may need to be repeated.  You may have imaging tests, such as X-rays, CT scans, or an MRI. What happens during the procedure?      Your health care provider will attach sensors to your abdomen.  Then, you will be asked to put on a belt that contains a recording device.  Your health care provider will turn on the camera inside the capsule.  You will swallow the capsule with water, just like taking a pill.  After you have swallowed the capsule, you may leave and return to your regular daily activities. Over the next several hours, the capsule will naturally travel through your digestive system and take photos of the small intestine.  The photos will be transmitted to the recording device on your belt.  After the specified number of hours (usually 8 or 12), you will take off the sensors and belt and return them to your health care provider as  directed.  Your health care provider will retrieve the photos that were taken and examine them on a computer. The procedure may vary among health care providers and hospitals. What happens after the procedure? After you swallow the capsule, you will be able to leave and carry on with your regular daily activities. Follow these instructions during the testing period: Activity  You can return to your normal activities right after swallowing the capsule.  Avoid strenuous activity, such as running or heavy lifting, for 8 hours or until the capsule has passed out of your body during a bowel movement. Eating and drinking  2 hours after swallowing the capsule -- you may drink clear liquids.  4 hours after swallowing the capsule -- you may eat lightly. You can return to your regular diet after the testing period. General instructions  You should not feel any pain or discomfort while the capsule is traveling through your digestive system. Contact your health care provider if: ? You develop nausea or vomiting. ? You feel pain or bloating in your abdomen.  Only remove the sensors and recording device as told by your health care provider.  Return the recording device to your health care provider as directed.  The capsule  will be naturally eliminated from the body during a bowel movement. Look for the capsule in your bowel movements over the next few days. Most people pass the capsule in about 8 hours, but this can vary.  If you do not pass the capsule within 2 weeks, contact a health care provider.  The capsule can be safely flushed down the toilet.  Avoid MRI equipment until the capsule has been eliminated from your body.  It is up to you to get the results of your procedure. Ask your health care provider, or the department performing the procedure, when your results will be ready. Summary  Capsule endoscopy is a procedure that is used to examine the inside of the small intestine.  You  will swallow a small capsule that has a tiny camera and transmitter in it. This capsule naturally travels through your digestive system, and the camera takes photos of the small intestine along the way.  After you swallow the capsule, you will be able to leave and carry on with your regular daily activities.  Contact your health care provider if you develop nausea or vomiting or if you feel pain or bloating in your abdomen. This information is not intended to replace advice given to you by your health care provider. Make sure you discuss any questions you have with your health care provider. Document Released: 10/03/2012 Document Revised: 12/21/2015 Document Reviewed: 12/21/2015 Elsevier Interactive Patient Education  2019 [employer].

## 2018-07-12 ENCOUNTER — Ambulatory Visit: Payer: Self-pay

## 2018-07-12 ENCOUNTER — Other Ambulatory Visit: Payer: Self-pay

## 2018-07-12 ENCOUNTER — Encounter (HOSPITAL_COMMUNITY): Payer: Self-pay | Admitting: Gastroenterology

## 2018-07-13 ENCOUNTER — Telehealth: Payer: Self-pay | Admitting: Gastroenterology

## 2018-07-13 ENCOUNTER — Other Ambulatory Visit: Payer: Self-pay | Admitting: Gastroenterology

## 2018-07-13 NOTE — Telephone Encounter (Signed)
I received a verbal report for the patient small bowel capsule done by Dr. Myrtie Neither.  I do not have the official written report yet as it has not been scanned into the chart.  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  He states he saw some fresh blood but attributes this to mucosal trauma from placement of the small bowel capsule endoscopically.  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  Will await official report that has been faxed to our office.  I discussed the above findings with the patient in detail.  We discussed various options in detail as well.  The AVMs seen are not bleeding, however, the concern is that once she is started on her oral anticoagulation, she may have worsening anemia like she had before.  The source of her iron deficiency anemia with no active bleeding is likely underlying AVMs.  Given that these AVMs are in the proximal small bowel as per Dr. Irving Burton verbal report, they could potentially be reached with a push enteroscopy.  However, I was able to reach at least the fourth portion of the duodenum with the EGD scope and did not see any AVMs.  In addition, no AVMs are mentioned in the EGD report done by Dr. Myrtie Neither.  Therefore, it is always a possibility that a push enteroscopy may not be able to reach these AVMs since they were not seen on the 2 EGD she recently had.  I have discussed this with Dr. Tobi Bastos, as patient may need a balloon enteroscopy, and he is willing to do a push enteroscopy next week, with the possibility of converting it to a balloon enteroscopy if needed.  Patient has been scheduled for her procedure on June 2nd with Dr. Tobi Bastos for push enteroscopy for treatment of AVMs, with possibility of conversion to a balloon enteroscopy.  Patient will go for rapid COVID  testing on June 1st.   I have informed Dr. Smith Robert her hematologist who is managing her anticoagulation about the above as well.  Patient is not having any active bleeding at this time.  Patient has an appointment to see Dr. Everlene Farrier for evaluation of her hiatal hernia.  The benign submucosal lesion Dr. Myrtie Neither mentions seeing in the distal small bowel is not an urgent issue at this time, and can be worked up at a later time as needed after reviewing his official report in clinic.  Above discussed with patient in detail and she is agreeable the above plan  Above discussed with Dr. Tobi Bastos and Dr. Smith Robert

## 2018-07-16 ENCOUNTER — Encounter: Payer: Self-pay | Admitting: Surgery

## 2018-07-16 ENCOUNTER — Ambulatory Visit (INDEPENDENT_AMBULATORY_CARE_PROVIDER_SITE_OTHER): Payer: Self-pay | Admitting: Surgery

## 2018-07-16 ENCOUNTER — Other Ambulatory Visit
Admission: RE | Admit: 2018-07-16 | Discharge: 2018-07-16 | Disposition: A | Discharge status: Home / Self Care | Payer: HRSA Program | Source: Ambulatory Visit | Attending: Gastroenterology | Admitting: Gastroenterology

## 2018-07-16 ENCOUNTER — Other Ambulatory Visit: Payer: Self-pay

## 2018-07-16 ENCOUNTER — Ambulatory Visit (INDEPENDENT_AMBULATORY_CARE_PROVIDER_SITE_OTHER): Payer: Self-pay | Admitting: Gastroenterology

## 2018-07-16 VITALS — BP 166/76 | HR 54 | Temp 97.9°F | Resp 16 | Ht 64.0 in | Wt 194.0 lb

## 2018-07-16 DIAGNOSIS — Z1159 Encounter for screening for other viral diseases: Secondary | ICD-10-CM | POA: Insufficient documentation

## 2018-07-16 DIAGNOSIS — K219 Gastro-esophageal reflux disease without esophagitis: Secondary | ICD-10-CM

## 2018-07-16 DIAGNOSIS — K449 Diaphragmatic hernia without obstruction or gangrene: Secondary | ICD-10-CM

## 2018-07-16 DIAGNOSIS — D509 Iron deficiency anemia, unspecified: Secondary | ICD-10-CM

## 2018-07-16 LAB — SARS CORONAVIRUS 2 BY RT PCR (HOSPITAL ORDER, PERFORMED IN ~~LOC~~ HOSPITAL LAB): SARS Coronavirus 2: NEGATIVE

## 2018-07-16 NOTE — Progress Notes (Signed)
Jasmine [name] , MD [address]  [address]  [place], [place] [address]  Main: [phone]  Fax: [phone]   Primary Care Physician: Patient, No Pcp Per  Virtual Visit via Video Note  I connected with patient on 07/16/18 at  9:00 AM EDT by video and verified that I am speaking with the correct person using two identifiers.   I discussed the limitations, risks, security and privacy concerns of performing an evaluation and management service by video  and the availability of in person appointments. I also discussed with the patient that there may be a patient responsible charge related to this service. The patient expressed understanding and agreed to proceed.  Location of Patient: Home Location of Provider: Home Persons involved: Patient and provider only   History of Present Illness: Chief Complaint  Patient presents with   Hospitalization Follow-up    Iron Deficiency Anemia    HPI: Jasmine FreerKande [name] is a 58 y.o. female  She is here today to see me for hospital follow-up.  I was consulted to see her on 07/01/2018 when she presented with anemia.  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  Her prior records suggest that she has had microcytic anemia for over a year.  On admission hemoglobin 6.5 g with an MCV of 68.9.  She has had a normal hemoglobin in 2013 with a value of 14.3 g.  [facility] but it is unclear why evaluation was not carried out.  On admission INR was 2.1.  She is a ferritin of 2, B12 100.  She underwent an upper endoscopy and colonoscopy on 07/02/2018 by Dr. Maximino Greenlandahiliani for evaluation of iron deficiency anemia.  EGD showed no gross abnormality except a large hiatal hernia.  Colonoscopy the prep was poor but no lesion contributing to the anemia was noted.  Capsule study performed at [facility] showed proximal small bowel AVM 1 minute after entering the duodenum    Current Outpatient Medications  Medication Sig Dispense  Refill   pantoprazole (PROTONIX) 40 MG tablet Take 1 tablet (40 mg total) by mouth daily. 30 tablet 0   rivaroxaban (XARELTO) 20 MG TABS tablet Take 20 mg by mouth daily at 8 pm.     No current facility-administered medications for this visit.     Allergies as of 07/16/2018 - Review Complete 07/16/2018  Allergen Reaction Noted   Food  07/06/2018   Tape Itching, Dermatitis, and Rash 07/01/2018   Codeine Other (See Comments) 10/26/2016   Iodine Other (See Comments) 07/06/2018   Shellfish allergy Other (See Comments) 10/26/2016    Review of Systems:    All systems reviewed and negative except where noted in HPI.  General Appearance:    Alert, cooperative, no distress, appears stated age  Head:    Normocephalic, without obvious abnormality, atraumatic  Eyes:    PERRL, conjunctiva/corneas clear,  Ears:    Grossly normal hearing    Neurologic:  Grossly normal    Observations/Objective:  Labs: CMP     Component Value Date/Time   NA 143 07/01/2018 0533   K 3.6 07/01/2018 0533   CL 109 07/01/2018 0533   CO2 25 07/01/2018 0533   GLUCOSE 94 07/01/2018 0533   BUN 10 07/01/2018 0533   CREATININE 0.60 07/01/2018 0533   CALCIUM 8.7 (L) 07/01/2018 0533   PROT 7.5 06/30/2018 1600   ALBUMIN 4.2 06/30/2018 1600   AST 17 06/30/2018 1600   ALT  12 06/30/2018 1600   ALKPHOS 71 06/30/2018 1600   BILITOT 1.1 06/30/2018 1600   GFRNONAA >60 07/01/2018 0533   GFRAA >60 07/01/2018 0533   Lab Results  Component Value Date   WBC 8.0 07/01/2018   HGB 9.8 (L) 07/06/2018   HGB 9.8 (L) 07/06/2018   HCT 35.4 (L) 07/06/2018   MCV 73.6 (L) 07/01/2018   PLT 293 07/01/2018    Imaging Studies: US Abdomen Limited Ruq  Result Date: 06/30/2018 CLINICAL DATA:  Acute onset of RIGHT UPPER QUADRANT abdominal pain associated with nausea that began approximately 2 hours prior to emergency department arrival. EXAM: ULTRASOUND ABDOMEN LIMITED RIGHT UPPER QUADRANT COMPARISON:  None. FINDINGS:  Gallbladder: No shadowing gallstones or echogenic sludge. No gallbladder wall thickening or pericholecystic fluid. Negative sonographic Murphy sign according to the ultrasound technologist. Common bile duct: Diameter: Approximately 3 mm. Liver: Normal size and echotexture without focal parenchymal abnormality. Portal vein is patent on color Doppler imaging with normal direction of blood flow towards the liver. IMPRESSION: Normal examination. Electronically Signed   By: Hulan Saas M.D.   On: 06/30/2018 17:25    Assessment and Plan:   Jasmine [name] is a 58 y.o. y/o female with iron/B12 deficiency anemia. EGD showed a large hiatal hernia which at times can cause iron deficiency. Colonoscopy was a poor prep but no lesions seen. 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   Plan :   1. Screening colonoscopy in 6 months due to poor prep. 2. Follow-up hematology for IV iron and B12 replacement 3. Small bowel push enteroscopy if cannot locate lesion then will proceed with single baloon enteroscopy  4. At next visit will discuss surgical management of hiatal hernia  F/u in 6 weeks  I have discussed alternative options, risks & benefits,  which include, but are not limited to, bleeding,pancreatitis, infection, perforation,respiratory complication & drug reaction.  The patient agrees with this plan & written consent will be obtained.        I discussed the assessment and treatment plan with the patient. The patient was provided an opportunity to ask questions and all were answered. The patient agreed with the plan and demonstrated an understanding of the instructions.   The patient was advised to call back or seek an in-person evaluation if the symptoms worsen or if the condition fails to improve as anticipated.    Dr Jasmine Mood MD,MRCP [facility]) Gastroenterology/Hepatology Pager: [phone]   Speech recognition software was used to dictate this note.

## 2018-07-16 NOTE — Progress Notes (Signed)
Patient ID: Jasmine [name], female   DOB: 1960-07-14, 58 y.o.   MRN: [id number]  HPI Jasmine [name] is a 58 y.o. female seen in consultation at the request of Dr. Maximino Greenland for Hiatal hernia.  Hospitalization for work symptomatic anemia a work-up including a colonoscopy and EGD.  I have personally reviewed the images showing evidence of a moderate to large hiatal hernia.  She consequently had capsule endoscopy showing small multiple duodenal and jejunal AVMs. ?  Distal SM leiomyoma. Also reports that 2 weeks ago when she was hospitalized she developed some nausea and some bile emesis associated with belching.  This improved spontaneously.  Denies any dysphagia and currently no abdominal pain.  Of note she was on Xarelto for cerebral venous thrombosis but has stopped for the last couple weeks due to the acute anemia.  Received 1 unit of packed red blood cells. Is able to perform more than 4 METS of activity without any shortness of breath or chest pain. His hemoglobin is 9.8, INR was 1.6 her BMP was normal. Dr. Tobi Bastos is to perform a push enteroscopy to evaluate for the small bowel AVM. She Did have an ultrasound that I have personally reviewed showing no evidence of gallbladder issues   HPI   Past Medical History:  Diagnosis Date  . Anemia   . Arthritis    LEFT SHOULDER  . H/O cerebral venous sinus thrombosis 2013  . History of hiatal hernia   . History of kidney stones     Past Surgical History:  Procedure Laterality Date  . COLONOSCOPY WITH PROPOFOL N/A 07/02/2018   Procedure: COLONOSCOPY WITH PROPOFOL;  Surgeon: Pasty Spillers, MD;  Location: [facility] ENDOSCOPY;  Service: Endoscopy;  Laterality: N/A;  . ESOPHAGOGASTRODUODENOSCOPY N/A 2001   approximately- for Reflux  . ESOPHAGOGASTRODUODENOSCOPY (EGD) WITH PROPOFOL N/A 07/02/2018   Procedure: ESOPHAGOGASTRODUODENOSCOPY (EGD) WITH PROPOFOL;  Surgeon: Pasty Spillers, MD;  Location: [facility] ENDOSCOPY;  Service: Endoscopy;  Laterality: N/A;   . ESOPHAGOGASTRODUODENOSCOPY (EGD) WITH PROPOFOL N/A 07/11/2018   Procedure: ESOPHAGOGASTRODUODENOSCOPY (EGD) WITH PROPOFOL;  Surgeon: Sherrilyn Rist, MD;  Location: [facility] ENDOSCOPY;  Service: Gastroenterology;  Laterality: N/A;  PT TO HAVE CAPSULE PLACEMENT  . GIVENS CAPSULE STUDY N/A 07/11/2018   Procedure: GIVENS CAPSULE STUDY;  Surgeon: Sherrilyn Rist, MD;  Location: [facility] ENDOSCOPY;  Service: Gastroenterology;  Laterality: N/A;    Family History  Problem Relation Age of Onset  . Breast cancer Mother   . Bone cancer Mother     Social History Social History   Tobacco Use  . Smoking status: Never Smoker  . Smokeless tobacco: Never Used  Substance Use Topics  . Alcohol use: Yes    Comment: occasional  . Drug use: Not Currently    Allergies  Allergen Reactions  . Food     Pumpkins/squash/zucchini (gourds foods) swelling of the face  . Tape Itching, Dermatitis and Rash    Transpore tape  . Codeine Other (See Comments)    Crawling skin  . Iodine Other (See Comments)    Unknown reaction type  . Shellfish Allergy Other (See Comments)    Crawling skin/stomach cramps.    Current Outpatient Medications  Medication Sig Dispense Refill  . pantoprazole (PROTONIX) 40 MG tablet Take 1 tablet (40 mg total) by mouth daily. 30 tablet 0  . rivaroxaban (XARELTO) 20 MG TABS tablet Take 20 mg by mouth daily at 8 pm.     No current facility-administered medications for this visit.  Review of Systems Full ROS  was asked and was negative except for the information on the HPI  Physical Exam Blood pressure (!) 166/76, pulse (!) 54, temperature 97.9 F (36.6 C), temperature source Temporal, resp. rate 16, height 5\' 4"  (1.626 m), weight 194 lb (88 kg), SpO2 96 %. CONSTITUTIONAL: NAD, obese. EYES: Pupils are equal, round, and reactive to light, Sclera are non-icteric. EARS, NOSE, MOUTH AND THROAT: The oropharynx is clear. The oral mucosa is pink and moist. Hearing is intact to  voice. LYMPH NODES:  Lymph nodes in the neck are normal. RESPIRATORY:  Lungs are clear. There is normal respiratory effort, with equal breath sounds bilaterally, and without pathologic use of accessory muscles. CARDIOVASCULAR: Heart is regular without murmurs, gallops, or rubs. GI: The abdomen is soft, nontender, and nondistended. There are no palpable masses. There is no hepatosplenomegaly. There are normal bowel sounds in all quadrants. GU: Rectal deferred.   MUSCULOSKELETAL: Normal muscle strength and tone. No cyanosis or edema.   SKIN: Turgor is good and there are no pathologic skin lesions or ulcers. NEUROLOGIC: Motor and sensation is grossly normal. Cranial nerves are grossly intact. PSYCH:  Oriented to person, place and time. Affect is normal.  Data Reviewed  I have personally reviewed the patient's imaging, laboratory findings and medical records.    Assessment/Plan Moderate sized hiatal hernia with mild symptoms.  She also had anemia from presumed AVMs combined with anticoagulation. We will start a work-up with a CT scan of the chest and abdomen/pelvis to evaluate for the hiatal hernia as well as a barium swallow.  We will await the work-up from GI regarding AV malformation.  We will see her back in a few weeks when she completes that.  I will also update her lab work include a CBC and a CMP. No need for emergent surgical intervention at this time.  Depending on the findings and the work-up we will tailor her therapy. A copy of  The report was sent to the referring provider  Sterling Bigiego Cyndie Woodbeck, MD FACS General Surgeon 07/16/2018, 12:50 PM

## 2018-07-16 NOTE — Patient Instructions (Addendum)
Barium swallow test is scheduled 07/24/2018 at 8:30 am @ [facility].  . Nothing by mouth after midnight the night prior.  Please arrive 15 minutes prior.   CT Chest / Abdomen scheduled 07/26/2018 @ 8:45 am Outpatient Imaging: [place] [address]  . Please pick up the prep kit from Radiology today.   Please see follow up listed below.    Hiatal Hernia  A hiatal hernia occurs when part of the stomach slides above the muscle that separates the abdomen from the chest (diaphragm). A person can be born with a hiatal hernia (congenital), or it may develop over time. In almost all cases of hiatal hernia, only the top part of the stomach pushes through the diaphragm. Many people have a hiatal hernia with no symptoms. The larger the hernia, the more likely it is that you will have symptoms. In some cases, a hiatal hernia allows stomach acid to flow back into the tube that carries food from your mouth to your stomach (esophagus). This may cause heartburn symptoms. Severe heartburn symptoms may mean that you have developed a condition called gastroesophageal reflux disease (GERD). What are the causes? This condition is caused by a weakness in the opening (hiatus) where the esophagus passes through the diaphragm to attach to the upper part of the stomach. A person may be born with a weakness in the hiatus, or a weakness can develop over time. What increases the risk? This condition is more likely to develop in:  Older people. Age is a major risk factor for a hiatal hernia, especially if you are over the age of 32.  Pregnant women.  People who are overweight.  People who have frequent constipation. What are the signs or symptoms? Symptoms of this condition usually develop in the form of GERD symptoms. Symptoms include:  Heartburn.  Belching.  Indigestion.  Trouble swallowing.  Coughing or wheezing.  Sore throat.  Hoarseness.  Chest pain.  Nausea and vomiting. How is this  diagnosed? This condition may be diagnosed during testing for GERD. Tests that may be done include:  X-rays of your stomach or chest.  An upper gastrointestinal (GI) series. This is an X-ray exam of your GI tract that is taken after you swallow a chalky liquid that shows up clearly on the X-ray.  Endoscopy. This is a procedure to look into your stomach using a thin, flexible tube that has a tiny camera and light on the end of it. How is this treated? This condition may be treated by:  Dietary and lifestyle changes to help reduce GERD symptoms.  Medicines. These may include: ? Over-the-counter antacids. ? Medicines that make your stomach empty more quickly. ? Medicines that block the production of stomach acid (H2 blockers). ? Stronger medicines to reduce stomach acid (proton pump inhibitors).  Surgery to repair the hernia, if other treatments are not helping. If you have no symptoms, you may not need treatment. Follow these instructions at home: Lifestyle and activity  Do not use any products that contain nicotine or tobacco, such as cigarettes and e-cigarettes. If you need help quitting, ask your health care provider.  Try to achieve and maintain a healthy body weight.  Avoid putting pressure on your abdomen. Anything that puts pressure on your abdomen increases the amount of acid that may be pushed up into your esophagus. ? Avoid bending over, especially after eating. ? Raise the head of your bed by putting blocks under the legs. This keeps your head and esophagus  higher than your stomach. ? Do not wear tight clothing around your chest or stomach. ? Try not to strain when having a bowel movement, when urinating, or when lifting heavy objects. Eating and drinking  Avoid foods that can worsen GERD symptoms. These may include: ? Fatty foods, like fried foods. ? Citrus fruits, like oranges or lemon. ? Other foods and drinks that contain acid, like orange juice or tomatoes. ?  Spicy food. ? Chocolate.  Eat frequent small meals instead of three large meals a day. This helps prevent your stomach from getting too full. ? Eat slowly. ? Do not lie down right after eating. ? Do not eat 1-2 hours before bed.  Do not drink beverages with caffeine. These include cola, coffee, cocoa, and tea.  Do not drink alcohol. General instructions  Take over-the-counter and prescription medicines only as told by your health care provider.  Keep all follow-up visits as told by your health care provider. This is important. Contact a health care provider if:  Your symptoms are not controlled with medicines or lifestyle changes.  You are having trouble swallowing.  You have coughing or wheezing that will not go away. Get help right away if:  Your pain is getting worse.  Your pain spreads to your arms, neck, jaw, teeth, or back.  You have shortness of breath.  You sweat for no reason.  You feel sick to your stomach (nauseous) or you vomit.  You vomit blood.  You have bright red blood in your stools.  You have black, tarry stools. This information is not intended to replace advice given to you by your health care provider. Make sure you discuss any questions you have with your health care provider. Document Released: 04/23/2003 Document Revised: 09/05/2016 Document Reviewed: 09/05/2016 Elsevier Interactive Patient Education  2019 [employer].

## 2018-07-17 ENCOUNTER — Encounter
Admission: RE | Disposition: A | Discharge status: Home / Self Care | Payer: Self-pay | Source: Home / Self Care | Attending: Gastroenterology

## 2018-07-17 ENCOUNTER — Ambulatory Visit: Payer: Self-pay | Admitting: Anesthesiology

## 2018-07-17 ENCOUNTER — Telehealth: Payer: Self-pay | Admitting: Gastroenterology

## 2018-07-17 ENCOUNTER — Ambulatory Visit
Admission: RE | Admit: 2018-07-17 | Discharge: 2018-07-17 | Disposition: A | Discharge status: Home / Self Care | Payer: Self-pay | Attending: Gastroenterology | Admitting: Gastroenterology

## 2018-07-17 ENCOUNTER — Encounter: Payer: Self-pay | Admitting: *Deleted

## 2018-07-17 ENCOUNTER — Other Ambulatory Visit: Payer: Self-pay

## 2018-07-17 DIAGNOSIS — M1990 Unspecified osteoarthritis, unspecified site: Secondary | ICD-10-CM | POA: Insufficient documentation

## 2018-07-17 DIAGNOSIS — K449 Diaphragmatic hernia without obstruction or gangrene: Secondary | ICD-10-CM | POA: Insufficient documentation

## 2018-07-17 DIAGNOSIS — K922 Gastrointestinal hemorrhage, unspecified: Secondary | ICD-10-CM | POA: Insufficient documentation

## 2018-07-17 DIAGNOSIS — Z7901 Long term (current) use of anticoagulants: Secondary | ICD-10-CM | POA: Insufficient documentation

## 2018-07-17 DIAGNOSIS — D509 Iron deficiency anemia, unspecified: Secondary | ICD-10-CM

## 2018-07-17 HISTORY — PX: ENTEROSCOPY: SHX5533

## 2018-07-17 SURGERY — ENTEROSCOPY
Anesthesia: General

## 2018-07-17 MED ORDER — MIDAZOLAM HCL 2 MG/2ML IJ SOLN
INTRAMUSCULAR | Status: AC
  Filled 2018-07-17: qty 2

## 2018-07-17 MED ORDER — MIDAZOLAM HCL 2 MG/2ML IJ SOLN
INTRAMUSCULAR | Status: DC | PRN
  Administered 2018-07-17: 2 mg via INTRAVENOUS

## 2018-07-17 MED ORDER — PROPOFOL 500 MG/50ML IV EMUL
INTRAVENOUS | Status: DC | PRN
  Administered 2018-07-17: 150 ug/kg/min via INTRAVENOUS

## 2018-07-17 MED ORDER — SODIUM CHLORIDE 0.9 % IV SOLN
INTRAVENOUS | Status: DC
  Administered 2018-07-17: 14:00:00 via INTRAVENOUS

## 2018-07-17 MED ORDER — LIDOCAINE HCL (CARDIAC) PF 100 MG/5ML IV SOSY
PREFILLED_SYRINGE | INTRAVENOUS | Status: DC | PRN
  Administered 2018-07-17: 40 mg via INTRAVENOUS

## 2018-07-17 MED ORDER — PROPOFOL 10 MG/ML IV BOLUS
INTRAVENOUS | Status: DC | PRN
  Administered 2018-07-17: 20 mg via INTRAVENOUS
  Administered 2018-07-17: 100 mg via INTRAVENOUS

## 2018-07-17 MED ORDER — PROPOFOL 10 MG/ML IV BOLUS
INTRAVENOUS | Status: AC
  Filled 2018-07-17: qty 20

## 2018-07-17 NOTE — Anesthesia Procedure Notes (Signed)
Date/Time: 07/17/2018 2:59 PM Performed by: Stormy Fabian, CRNA Pre-anesthesia Checklist: Patient identified, Emergency Drugs available, Suction available and Patient being monitored Patient Re-evaluated:Patient Re-evaluated prior to induction Oxygen Delivery Method: Nasal cannula Induction Type: IV induction Dental Injury: Teeth and Oropharynx as per pre-operative assessment  Comments: Nasal cannula with etCO2 monitoring

## 2018-07-17 NOTE — Telephone Encounter (Signed)
Pt left vm she has an Endoscopy on 07/17/18 with Dr. Tobi Bastos and  She has not received a call with instructions or what time to be there or when to stop eating please call pt

## 2018-07-17 NOTE — Anesthesia Post-op Follow-up Note (Signed)
Anesthesia QCDR form completed.        

## 2018-07-17 NOTE — Anesthesia Preprocedure Evaluation (Addendum)
Anesthesia Evaluation  Patient identified by MRN, date of birth, ID band Patient awake    Reviewed: Allergy & Precautions, H&P , NPO status , Patient's Chart, lab work & pertinent test results  History of Anesthesia Complications Negative for: history of anesthetic complications  Airway Mallampati: II  TM Distance: >3 FB Neck ROM: Full    Dental  (+) Teeth Intact, Dental Advisory Given, Loose   Pulmonary neg pulmonary ROS,    Pulmonary exam normal breath sounds clear to auscultation       Cardiovascular negative cardio ROS   Rhythm:Regular Rate:Normal     Neuro/Psych negative neurological ROS  negative psych ROS   GI/Hepatic negative GI ROS, Neg liver ROS,   Endo/Other  negative endocrine ROS  Renal/GU negative Renal ROS  negative genitourinary   Musculoskeletal  (+) Arthritis , Osteoarthritis,    Abdominal   Peds  Hematology  (+) Blood dyscrasia, anemia ,   Anesthesia Other Findings Past Medical History: No date: Anemia No date: Arthritis     Comment:  LEFT SHOULDER 2013: H/O cerebral venous sinus thrombosis No date: History of hiatal hernia No date: History of kidney stones   Reproductive/Obstetrics negative OB ROS                             Anesthesia Physical  Anesthesia Plan  ASA: II  Anesthesia Plan: General   Post-op Pain Management:    Induction: Intravenous  PONV Risk Score and Plan: 2 and Propofol infusion and TIVA  Airway Management Planned: Nasal Cannula and Natural Airway  Additional Equipment:   Intra-op Plan:   Post-operative Plan:   Informed Consent: I have reviewed the patients History and Physical, chart, labs and discussed the procedure including the risks, benefits and alternatives for the proposed anesthesia with the patient or authorized representative who has indicated his/her understanding and acceptance.     Dental advisory  given  Plan Discussed with: CRNA  Anesthesia Plan Comments:         Anesthesia Quick Evaluation

## 2018-07-17 NOTE — H&P (Signed)
Jasmine MoodKiran Keelia Graybill, MD [address], [address], [place], [place], [id number] [address], [address], [place], [place], [address] Phone: [phone]  Fax: [phone]  Primary Care Physician:  Patient, No Pcp Per   Pre-Procedure History & Physical: HPI:  Jasmine FreerKande [name] is a 58 y.o. female is here for a single balloon endoscopy    Past Medical History:  Diagnosis Date  . Anemia   . Arthritis    LEFT SHOULDER  . H/O cerebral venous sinus thrombosis 2013  . History of hiatal hernia   . History of kidney stones     Past Surgical History:  Procedure Laterality Date  . COLONOSCOPY WITH PROPOFOL N/A 07/02/2018   Procedure: COLONOSCOPY WITH PROPOFOL;  Surgeon: Pasty Spillersahiliani, Varnita B, MD;  Location: [facility] ENDOSCOPY;  Service: Endoscopy;  Laterality: N/A;  . ESOPHAGOGASTRODUODENOSCOPY N/A 2001   approximately- for Reflux  . ESOPHAGOGASTRODUODENOSCOPY (EGD) WITH PROPOFOL N/A 07/02/2018   Procedure: ESOPHAGOGASTRODUODENOSCOPY (EGD) WITH PROPOFOL;  Surgeon: Pasty Spillersahiliani, Varnita B, MD;  Location: [facility] ENDOSCOPY;  Service: Endoscopy;  Laterality: N/A;  . ESOPHAGOGASTRODUODENOSCOPY (EGD) WITH PROPOFOL N/A 07/11/2018   Procedure: ESOPHAGOGASTRODUODENOSCOPY (EGD) WITH PROPOFOL;  Surgeon: Sherrilyn Ristanis, Henry L III, MD;  [facility];  Service: Gastroenterology;  Laterality: N/A;  PT TO HAVE CAPSULE PLACEMENT  . GIVENS CAPSULE STUDY N/A 07/11/2018   Procedure: GIVENS CAPSULE STUDY;  Surgeon: Sherrilyn Ristanis, Henry L III, MD;  [facility];  Service: Gastroenterology;  Laterality: N/A;    Prior to Admission medications   Medication Sig Start Date End Date Taking? Authorizing Provider  pantoprazole (PROTONIX) 40 MG tablet Take 1 tablet (40 mg total) by mouth daily. 07/04/18  Yes Shaune Pollackhen, Qing, MD  rivaroxaban (XARELTO) 20 MG TABS tablet Take 20 mg by mouth daily at 8 pm.    [provider]    Allergies as of 07/13/2018 - Review Complete 07/11/2018  Allergen Reaction Noted  . Food  07/06/2018  .  Tape Itching, Dermatitis, and Rash 07/01/2018  . Codeine Other (See Comments) 10/26/2016  . Iodine Other (See Comments) 07/06/2018  . Shellfish allergy Other (See Comments) 10/26/2016    Family History  Problem Relation Age of Onset  . Breast cancer Mother   . Bone cancer Mother     Social History   Socioeconomic History  . Marital status: Single    Spouse name: Not on file  . Number of children: Not on file  . Years of education: Not on file  . Highest education level: Not on file  Occupational History  . Not on file  Social Needs  . Financial resource strain: Not on file  . Food insecurity:    Worry: Not on file    Inability: Not on file  . Transportation needs:    Medical: Not on file    Non-medical: Not on file  Tobacco Use  . Smoking status: Never Smoker  . Smokeless tobacco: Never Used  Substance and Sexual Activity  . Alcohol use: Yes    Comment: occasional  . Drug use: Not Currently  . Sexual activity: Not on file  Lifestyle  . Physical activity:    Days per week: Not on file    Minutes per session: Not on file  . Stress: Not on file  Relationships  . Social connections:    Talks on phone: Not on file    Gets together: Not on file    Attends religious service: Not on file    Active member of club or organization:  Not on file    Attends meetings of clubs or organizations: Not on file    Relationship status: Not on file  . Intimate partner violence:    Fear of current or ex partner: Not on file    Emotionally abused: Not on file    Physically abused: Not on file    Forced sexual activity: Not on file  Other Topics Concern  . Not on file  Social History Narrative   Lives at home with her parents.  Independent at baseline.    Review of Systems: See HPI, otherwise negative ROS  Physical Exam: BP (!) 192/73   Pulse 63   Temp 97.6 F (36.4 C) (Axillary)   Resp 16   Wt 88 kg   SpO2 100%   BMI 33.30 kg/m  General:   Alert,  pleasant and  cooperative in NAD Head:  Normocephalic and atraumatic. Neck:  Supple; no masses or thyromegaly. Lungs:  Clear throughout to auscultation, normal respiratory effort.    Heart:  +S1, +S2, Regular rate and rhythm, No edema. Abdomen:  Soft, nontender and nondistended. Normal bowel sounds, without guarding, and without rebound.   Neurologic:  Alert and  oriented x4;  grossly normal neurologically.  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  Questions have been answered.  All parties agreeable.   Jasmine Mood, MD  07/17/2018, 2:35 PM

## 2018-07-17 NOTE — Telephone Encounter (Signed)
Spoke with pt regarding her questions about her procedure prep and time. Pt states she received the call from Endo shortly after she called our office yesterday.

## 2018-07-17 NOTE — Transfer of Care (Signed)
Immediate Anesthesia Transfer of Care Note  Patient: Jasmine [name]  Procedure(s) Performed: ENTEROSCOPY (N/A )  Patient Location: PACU  Anesthesia Type:General  Level of Consciousness: sedated  Airway & Oxygen Therapy: Patient Spontanous Breathing and Patient connected to nasal cannula oxygen  Post-op Assessment: Report given to RN and Post -op Vital signs reviewed and stable  Post vital signs: Reviewed and stable  Last Vitals:  Vitals Value Taken Time  BP 98/45   Temp    Pulse 63 07/17/2018  3:27 PM  Resp 24 07/17/2018  3:27 PM  SpO2 98 % 07/17/2018  3:27 PM  Vitals shown include unvalidated device data.  Last Pain:  Vitals:   07/17/18 1353  TempSrc: Axillary  PainSc: 0-No pain         Complications: No apparent anesthesia complications

## 2018-07-17 NOTE — Op Note (Signed)
[facility] Patient Name: Jasmine [name] Procedure Date: 07/17/2018 2:33 PM MRN: [id number] Account #: [id number] Date of Birth: 1961-01-07 Admit Type: Outpatient Age: 58 Room: [facility] ENDO ROOM 4 Gender: Female Note Status: Finalized Procedure:            Small bowel enteroscopy Indications:          GI bleeding source not documented by previous video                        capsule endoscopy Providers:            Wyline Mood MD, MD Medicines:            Monitored Anesthesia Care Complications:        No immediate complications. Procedure:            Pre-Anesthesia Assessment:                       - Prior to the procedure, a History and Physical was                        performed, and patient medications, allergies and                        sensitivities were reviewed. The patient's tolerance of                        previous anesthesia was reviewed.                       - The risks and benefits of the procedure and the                        sedation options and risks were discussed with the                        patient. All questions were answered and informed                        consent was obtained.                       After obtaining informed consent, the endoscope was                        passed under direct vision. Throughout the procedure,                        the patient's blood pressure, pulse, and oxygen                        saturations were monitored continuously. The was                        introduced through the mouth and advanced to the                        mid-jejunum. The small bowel enteroscopy was                        accomplished with ease. The patient tolerated the  procedure well. Findings:      The esophagus was normal.      A medium-sized hiatal hernia was present.      The examined duodenum was normal.      There was no evidence of significant pathology in the entire examined     portion of jejunum.      scope inserted till 150 cm mark- repeated twice- no abnormal areas seen       , no bleeding noted . Impression:           - Normal esophagus.                       - Medium-sized hiatal hernia.                       - Normal examined duodenum.                       - The examined portion of the jejunum was normal.                       - No specimens collected. Recommendation:       - Discharge patient to home (with escort).                       - Resume previous diet.                       - resatrt anticoagulation, monitor stool color, f/u                        with Dr Smith Robertao for iron infusions. The hiatal hernia too                        could be contributing to iron deficiency.if has melena                        suggest repeating casule during episode of possible                        bleed in view to catch the lesion Procedure Code(s):    --- Professional ---                       [phone], Small intestinal endoscopy, enteroscopy beyond                        second portion of duodenum, not including ileum;                        diagnostic, including collection of specimen(s) by                        brushing or washing, when performed (separate procedure) Diagnosis Code(s):    --- Professional ---                       K44.9, Diaphragmatic hernia without obstruction or                        gangrene  K92.2, Gastrointestinal hemorrhage, unspecified CPT copyright 2019 American Medical Association. All rights reserved. The codes documented in this report are preliminary and upon coder review may  be revised to meet current compliance requirements. Wyline Mood, MD Wyline Mood MD, MD 07/17/2018 3:24:17 PM This report has been signed electronically. Number of Addenda: 0 Note Initiated On: 07/17/2018 2:33 PM      [facility]

## 2018-07-18 ENCOUNTER — Inpatient Hospital Stay: Payer: Self-pay | Attending: Oncology

## 2018-07-18 ENCOUNTER — Other Ambulatory Visit: Payer: Self-pay

## 2018-07-18 VITALS — BP 145/85 | HR 56 | Temp 98.0°F | Resp 20

## 2018-07-18 DIAGNOSIS — Z79899 Other long term (current) drug therapy: Secondary | ICD-10-CM | POA: Insufficient documentation

## 2018-07-18 DIAGNOSIS — D500 Iron deficiency anemia secondary to blood loss (chronic): Secondary | ICD-10-CM | POA: Insufficient documentation

## 2018-07-18 DIAGNOSIS — K922 Gastrointestinal hemorrhage, unspecified: Secondary | ICD-10-CM | POA: Insufficient documentation

## 2018-07-18 DIAGNOSIS — D509 Iron deficiency anemia, unspecified: Secondary | ICD-10-CM

## 2018-07-18 MED ORDER — SODIUM CHLORIDE 0.9 % IV SOLN
Freq: Once | INTRAVENOUS | Status: AC
  Administered 2018-07-18: 14:00:00 via INTRAVENOUS
  Filled 2018-07-18: qty 250

## 2018-07-18 MED ORDER — CYANOCOBALAMIN 1000 MCG/ML IJ SOLN
1000.0000 ug | INTRAMUSCULAR | Status: DC
  Administered 2018-07-18: 1000 ug via INTRAMUSCULAR
  Filled 2018-07-18: qty 1

## 2018-07-18 MED ORDER — SODIUM CHLORIDE 0.9 % IV SOLN
510.0000 mg | Freq: Once | INTRAVENOUS | Status: AC
  Administered 2018-07-18: 510 mg via INTRAVENOUS
  Filled 2018-07-18: qty 17

## 2018-07-18 NOTE — Anesthesia Postprocedure Evaluation (Signed)
Anesthesia Post Note  Patient: Jasmine [name]  Procedure(s) Performed: ENTEROSCOPY (N/A )  Patient location during evaluation: Endoscopy Anesthesia Type: General Level of consciousness: awake and alert Pain management: pain level controlled Vital Signs Assessment: post-procedure vital signs reviewed and stable Respiratory status: spontaneous breathing, nonlabored ventilation, respiratory function stable and patient connected to nasal cannula oxygen Cardiovascular status: blood pressure returned to baseline and stable Postop Assessment: no apparent nausea or vomiting Anesthetic complications: no     Last Vitals:  Vitals:   07/17/18 1535 07/17/18 1546  BP: (!) 108/50 (!) 124/56  Pulse: (!) 57 (!) 57  Resp: (!) 22 17  Temp:    SpO2: 99% 100%    Last Pain:  Vitals:   07/18/18 0802  TempSrc:   PainSc: 0-No pain                 Laksh Hinners S

## 2018-07-24 ENCOUNTER — Ambulatory Visit: Payer: Self-pay

## 2018-07-25 ENCOUNTER — Inpatient Hospital Stay: Payer: Self-pay

## 2018-07-25 ENCOUNTER — Other Ambulatory Visit: Payer: Self-pay

## 2018-07-25 VITALS — BP 149/86 | HR 62 | Temp 95.9°F | Resp 19

## 2018-07-25 DIAGNOSIS — D509 Iron deficiency anemia, unspecified: Secondary | ICD-10-CM

## 2018-07-25 MED ORDER — SODIUM CHLORIDE 0.9 % IV SOLN
Freq: Once | INTRAVENOUS | Status: AC
  Administered 2018-07-25: 13:00:00 via INTRAVENOUS
  Filled 2018-07-25: qty 250

## 2018-07-25 MED ORDER — CYANOCOBALAMIN 1000 MCG/ML IJ SOLN
1000.0000 ug | INTRAMUSCULAR | Status: DC
  Administered 2018-07-25: 14:00:00 1000 ug via INTRAMUSCULAR
  Filled 2018-07-25: qty 1

## 2018-07-25 MED ORDER — SODIUM CHLORIDE 0.9 % IV SOLN
510.0000 mg | Freq: Once | INTRAVENOUS | Status: AC
  Administered 2018-07-25: 14:00:00 510 mg via INTRAVENOUS
  Filled 2018-07-25: qty 17

## 2018-07-26 ENCOUNTER — Other Ambulatory Visit: Payer: Self-pay

## 2018-07-26 ENCOUNTER — Ambulatory Visit
Admission: RE | Admit: 2018-07-26 | Discharge: 2018-07-26 | Disposition: A | Discharge status: Home / Self Care | Payer: Self-pay | Source: Ambulatory Visit | Attending: Surgery | Admitting: Surgery

## 2018-07-26 ENCOUNTER — Ambulatory Visit: Payer: Self-pay

## 2018-07-26 ENCOUNTER — Telehealth: Payer: Self-pay | Admitting: *Deleted

## 2018-07-26 DIAGNOSIS — K219 Gastro-esophageal reflux disease without esophagitis: Secondary | ICD-10-CM | POA: Insufficient documentation

## 2018-07-26 MED ORDER — IOHEXOL 300 MG/ML  SOLN
100.0000 mL | Freq: Once | INTRAMUSCULAR | Status: AC | PRN
  Administered 2018-07-26: 100 mL via INTRAVENOUS

## 2018-07-26 NOTE — Telephone Encounter (Signed)
-----   Message from Jules Husbands, MD sent at 07/26/2018 12:39 PM EDT ----- Please let her know that we saw a large hiatal hernia, nothing new, . She needs to keep appt w me ----- Message ----- From: Interface, Rad Results In Sent: 07/26/2018  12:09 PM EDT To: Jules Husbands, MD

## 2018-07-26 NOTE — Telephone Encounter (Signed)
Notified patient as instructed, patient pleased. Discussed follow-up appointments, patient agrees  

## 2018-07-31 ENCOUNTER — Other Ambulatory Visit: Payer: Self-pay | Admitting: Surgery

## 2018-07-31 ENCOUNTER — Other Ambulatory Visit: Payer: Self-pay

## 2018-07-31 ENCOUNTER — Ambulatory Visit
Admission: RE | Admit: 2018-07-31 | Discharge: 2018-07-31 | Disposition: A | Discharge status: Home / Self Care | Payer: Self-pay | Source: Ambulatory Visit | Attending: Surgery | Admitting: Surgery

## 2018-07-31 DIAGNOSIS — K449 Diaphragmatic hernia without obstruction or gangrene: Secondary | ICD-10-CM

## 2018-08-01 ENCOUNTER — Ambulatory Visit: Payer: Self-pay

## 2018-08-01 ENCOUNTER — Other Ambulatory Visit: Payer: Self-pay

## 2018-08-06 ENCOUNTER — Ambulatory Visit (INDEPENDENT_AMBULATORY_CARE_PROVIDER_SITE_OTHER): Payer: Self-pay | Admitting: Surgery

## 2018-08-06 ENCOUNTER — Other Ambulatory Visit: Payer: Self-pay

## 2018-08-06 ENCOUNTER — Encounter: Payer: Self-pay | Admitting: Surgery

## 2018-08-06 ENCOUNTER — Encounter: Payer: Self-pay | Admitting: *Deleted

## 2018-08-06 VITALS — BP 179/92 | HR 57 | Temp 97.7°F | Ht 64.0 in | Wt 191.0 lb

## 2018-08-06 DIAGNOSIS — K449 Diaphragmatic hernia without obstruction or gangrene: Secondary | ICD-10-CM

## 2018-08-06 NOTE — Progress Notes (Signed)
Patient needs to be scheduled for a robotic hiatal hernia surgery. Dr. Nestor Lewandowsky will need to assist with this case.   The patient states she is trying to get approved for Financial Assistance and is wanting to wait until she hears back before scheduling surgery.   The patient will need an in office Pre-admit appointment prior.   Patient will also need to discontinue Xarelto 2 days prior to surgery.    Will await patient's call.

## 2018-08-06 NOTE — Progress Notes (Signed)
Outpatient Surgical Follow Up  08/06/2018  Graciella FreerKande Taff is an 58 y.o. female.   Chief Complaint  Patient presents with  . Follow-up    HPI: 58 year old female well-known to me for a hiatal hernia.  She did have a history of anemia and has been worked up had a recent [place] enteroscopy that failed to identify any AVMs. SHe is able to perform more than 4 METS of activity without any shortness of breath or chest pain.  Has hemoglobin is 9.8.  He did have barium swallow and CT scan of the abdomen and pelvis that I have personally reviewed.  There is evidence of large hiatal hernia, normal motility of the esophagus and no masses. No fevers no chills.  No Hematochezia or melena  Past Medical History:  Diagnosis Date  . Anemia   . Arthritis    LEFT SHOULDER  . H/O cerebral venous sinus thrombosis 2013  . History of hiatal hernia   . History of kidney stones     Past Surgical History:  Procedure Laterality Date  . COLONOSCOPY WITH PROPOFOL N/A 07/02/2018   Procedure: COLONOSCOPY WITH PROPOFOL;  Surgeon: Pasty Spillersahiliani, Varnita B, MD;  Location: [facility] ENDOSCOPY;  Service: Endoscopy;  Laterality: N/A;  . ENTEROSCOPY N/A 07/17/2018   Procedure: ENTEROSCOPY;  Surgeon: Wyline MoodAnna, Kiran, MD;  [facility];  Service: Gastroenterology;  Laterality: N/A;  . ESOPHAGOGASTRODUODENOSCOPY N/A 2001   approximately- for Reflux  . ESOPHAGOGASTRODUODENOSCOPY (EGD) WITH PROPOFOL N/A 07/02/2018   Procedure: ESOPHAGOGASTRODUODENOSCOPY (EGD) WITH PROPOFOL;  Surgeon: Pasty Spillersahiliani, Varnita B, MD;  Location: [facility] ENDOSCOPY;  Service: Endoscopy;  Laterality: N/A;  . ESOPHAGOGASTRODUODENOSCOPY (EGD) WITH PROPOFOL N/A 07/11/2018   Procedure: ESOPHAGOGASTRODUODENOSCOPY (EGD) WITH PROPOFOL;  Surgeon: Sherrilyn Ristanis, Henry L III, MD;  [facility];  Service: Gastroenterology;  Laterality: N/A;  PT TO HAVE CAPSULE PLACEMENT  . GIVENS CAPSULE STUDY N/A 07/11/2018   Procedure: GIVENS CAPSULE STUDY;  Surgeon: Sherrilyn Ristanis, Henry L III,  MD;  [facility];  Service: Gastroenterology;  Laterality: N/A;    Family History  Problem Relation Age of Onset  . Breast cancer Mother   . Bone cancer Mother     Social History:  reports that she has never smoked. She has never used smokeless tobacco. She reports current alcohol use. She reports previous drug use.  Allergies:  Allergies  Allergen Reactions  . Food     Pumpkins/squash/zucchini (gourds foods) swelling of the face  . Tape Itching, Dermatitis and Rash    Transpore tape  . Povidone Iodine   . Codeine Other (See Comments)    Crawling skin  . Shellfish Allergy Other (See Comments)    Crawling skin/stomach cramps.    Medications reviewed.    ROS Full ROS performed and is otherwise negative other than what is stated in HPI   BP (!) 179/92   Pulse (!) 57   Temp 97.7 F (36.5 C) (Skin)   Ht 5\' 4"  (1.626 m)   Wt 191 lb (86.6 kg)   SpO2 99%   BMI 32.79 kg/m   Physical Exam Vitals signs reviewed.  Constitutional:      General: She is not in acute distress.    Appearance: Normal appearance. She is normal weight.  HENT:     Head: Normocephalic.  Eyes:     General: No scleral icterus.       Right eye: No discharge.        Left eye: No discharge.  Neck:     Musculoskeletal: Normal range  of motion and neck supple. No neck rigidity or muscular tenderness.  Cardiovascular:     Rate and Rhythm: Normal rate.     Pulses: Normal pulses.     Heart sounds: No murmur.  Pulmonary:     Effort: Pulmonary effort is normal. No respiratory distress.     Breath sounds: Normal breath sounds. No stridor.  Abdominal:     General: Abdomen is flat. There is no distension.     Palpations: There is no mass.     Tenderness: There is no abdominal tenderness. There is no guarding or rebound.     Hernia: No hernia is present.  Musculoskeletal: Normal range of motion.        General: No swelling.  Skin:    General: Skin is warm and dry.     Capillary Refill:  Capillary refill takes less than 2 seconds.     Coloration: Skin is not jaundiced.  Neurological:     General: No focal deficit present.     Mental Status: She is alert and oriented to person, place, and time.  Psychiatric:        Mood and Affect: Mood normal.        Behavior: Behavior normal.        Thought Content: Thought content normal.        Judgment: Judgment normal.      Assessment/Plan: 58 year old female with a symptomatic hiatal hernia with about a third of her stomach within the mediastinum. Is with the patient in detail and I do recommend repair.  I do think that she is a good candidate for robotic surgery.  Procedure discussed with the patient in detail.  Risk, benefits and possible complications to include but not limited to: Bleeding, infection, esophageal injury, recurrence, chronic pain.  She understands and wishes to proceed. We will plan on repair of hiatal hernia with Nissen fundoplication    Caroleen Hamman, MD [place] Surgeon

## 2018-08-06 NOTE — H&P (View-Only) (Signed)
Outpatient Surgical Follow Up  08/06/2018  Jasmine [name] is an 57 y.o. female.   Chief Complaint  Patient presents with  . Follow-up    HPI: 57-year-old female well-known to me for a hiatal hernia.  She did have a history of anemia and has been worked up had a recent [place] enteroscopy that failed to identify any AVMs. SHe is able to perform more than 4 METS of activity without any shortness of breath or chest pain.  Has hemoglobin is 9.8.  He did have barium swallow and CT scan of the abdomen and pelvis that I have personally reviewed.  There is evidence of large hiatal hernia, normal motility of the esophagus and no masses. No fevers no chills.  No Hematochezia or melena  Past Medical History:  Diagnosis Date  . Anemia   . Arthritis    LEFT SHOULDER  . H/O cerebral venous sinus thrombosis 2013  . History of hiatal hernia   . History of kidney stones     Past Surgical History:  Procedure Laterality Date  . COLONOSCOPY WITH PROPOFOL N/A 07/02/2018   Procedure: COLONOSCOPY WITH PROPOFOL;  Surgeon: Tahiliani, Varnita B, MD;  Location: [facility] ENDOSCOPY;  Service: Endoscopy;  Laterality: N/A;  . ENTEROSCOPY N/A 07/17/2018   Procedure: ENTEROSCOPY;  Surgeon: Anna, Kiran, MD;  Location: [facility] ENDOSCOPY;  Service: Gastroenterology;  Laterality: N/A;  . ESOPHAGOGASTRODUODENOSCOPY N/A 2001   approximately- for Reflux  . ESOPHAGOGASTRODUODENOSCOPY (EGD) WITH PROPOFOL N/A 07/02/2018   Procedure: ESOPHAGOGASTRODUODENOSCOPY (EGD) WITH PROPOFOL;  Surgeon: Tahiliani, Varnita B, MD;  Location: [facility] END[facility]COPY;  Service: Endoscopy;  Laterality: N/A;  . ESOPHAGOGASTRODUODENOSCOPY (EGD) WITH PROPOFOL N/A 07/11/2018   Procedure: ESOPHAGOGASTRODUODENOSCOPY (EGD) WITH PROPOFOL;  Surgeon: Danis, Henry L III, MD;  Location: [facility];  Service: Gastroenterology;  Laterality: N/A;  PT TO HAVE CAPSULE PLACEMENT  . GIVENS CAPSULE STUDY N/A 07/11/2018   Procedure: GIVENS CAPSULE STUDY;  Surgeon: Danis, Henry L III,  MD;  Location: [facility];  Service: Gastroenterology;  Laterality: N/A;    Family History  Problem Relation Age of Onset  . Breast cancer Mother   . Bone cancer Mother     Social History:  reports that she has never smoked. She has never used smokeless tobacco. She reports current alcohol use. She reports previous drug use.  Allergies:  Allergies  Allergen Reactions  . Food     Pumpkins/squash/zucchini (gourds foods) swelling of the face  . Tape Itching, Dermatitis and Rash    Transpore tape  . Povidone Iodine   . Codeine Other (See Comments)    Crawling skin  . Shellfish Allergy Other (See Comments)    Crawling skin/stomach cramps.    Medications reviewed.    ROS Full ROS performed and is otherwise negative other than what is stated in HPI   BP (!) 179/92   Pulse (!) 57   Temp 97.7 F (36.5 C) (Skin)   Ht 5' 4" (1.626 m)   Wt 191 lb (86.6 kg)   SpO2 99%   BMI 32.79 kg/m   Physical Exam Vitals signs reviewed.  Constitutional:      General: She is not in acute distress.    Appearance: Normal appearance. She is normal weight.  HENT:     Head: Normocephalic.  Eyes:     General: No scleral icterus.       Right eye: No discharge.        Left eye: No discharge.  Neck:     Musculoskeletal: Normal range   of motion and neck supple. No neck rigidity or muscular tenderness.  Cardiovascular:     Rate and Rhythm: Normal rate.     Pulses: Normal pulses.     Heart sounds: No murmur.  Pulmonary:     Effort: Pulmonary effort is normal. No respiratory distress.     Breath sounds: Normal breath sounds. No stridor.  Abdominal:     General: Abdomen is flat. There is no distension.     Palpations: There is no mass.     Tenderness: There is no abdominal tenderness. There is no guarding or rebound.     Hernia: No hernia is present.  Musculoskeletal: Normal range of motion.        General: No swelling.  Skin:    General: Skin is warm and dry.     Capillary Refill:  Capillary refill takes less than 2 seconds.     Coloration: Skin is not jaundiced.  Neurological:     General: No focal deficit present.     Mental Status: She is alert and oriented to person, place, and time.  Psychiatric:        Mood and Affect: Mood normal.        Behavior: Behavior normal.        Thought Content: Thought content normal.        Judgment: Judgment normal.      Assessment/Plan: 58 year old female with a symptomatic hiatal hernia with about a third of her stomach within the mediastinum. Is with the patient in detail and I do recommend repair.  I do think that she is a good candidate for robotic surgery.  Procedure discussed with the patient in detail.  Risk, benefits and possible complications to include but not limited to: Bleeding, infection, esophageal injury, recurrence, chronic pain.  She understands and wishes to proceed. We will plan on repair of hiatal hernia with Nissen fundoplication    Caroleen Hamman, MD [place] [facility]geon

## 2018-08-08 ENCOUNTER — Other Ambulatory Visit: Payer: Self-pay

## 2018-08-08 ENCOUNTER — Inpatient Hospital Stay: Payer: Self-pay

## 2018-08-08 DIAGNOSIS — D509 Iron deficiency anemia, unspecified: Secondary | ICD-10-CM

## 2018-08-08 DIAGNOSIS — K449 Diaphragmatic hernia without obstruction or gangrene: Secondary | ICD-10-CM

## 2018-08-08 LAB — CBC WITH DIFFERENTIAL/PLATELET
Abs Immature Granulocytes: 0.02 10*3/uL (ref 0.00–0.07)
Basophils Absolute: 0.1 10*3/uL (ref 0.0–0.1)
Basophils Relative: 1 %
Eosinophils Absolute: 0.4 10*3/uL (ref 0.0–0.5)
Eosinophils Relative: 5 %
HCT: 43.5 % (ref 36.0–46.0)
Hemoglobin: 13.1 g/dL (ref 12.0–15.0)
Immature Granulocytes: 0 %
Lymphocytes Relative: 20 %
Lymphs Abs: 1.6 10*3/uL (ref 0.7–4.0)
MCH: 25.3 pg — ABNORMAL LOW (ref 26.0–34.0)
MCHC: 30.1 g/dL (ref 30.0–36.0)
MCV: 84 fL (ref 80.0–100.0)
Monocytes Absolute: 0.4 10*3/uL (ref 0.1–1.0)
Monocytes Relative: 5 %
Neutro Abs: 5.6 10*3/uL (ref 1.7–7.7)
Neutrophils Relative %: 69 %
Platelets: 261 10*3/uL (ref 150–400)
RBC: 5.18 MIL/uL — ABNORMAL HIGH (ref 3.87–5.11)
RDW: 31.7 % — ABNORMAL HIGH (ref 11.5–15.5)
WBC: 8 10*3/uL (ref 4.0–10.5)
nRBC: 0 % (ref 0.0–0.2)

## 2018-08-08 LAB — COMPREHENSIVE METABOLIC PANEL
ALT: 13 U/L (ref 0–44)
AST: 17 U/L (ref 15–41)
Albumin: 4.2 g/dL (ref 3.5–5.0)
Alkaline Phosphatase: 70 U/L (ref 38–126)
Anion gap: 8 (ref 5–15)
BUN: 14 mg/dL (ref 6–20)
CO2: 29 mmol/L (ref 22–32)
Calcium: 9.4 mg/dL (ref 8.9–10.3)
Chloride: 104 mmol/L (ref 98–111)
Creatinine, Ser: 0.68 mg/dL (ref 0.44–1.00)
GFR calc Af Amer: >60 mL/min (ref >60)
GFR calc non Af Amer: >60 mL/min (ref >60)
Glucose, Bld: 95 mg/dL (ref 70–99)
Potassium: 4.8 mmol/L (ref 3.5–5.1)
Sodium: 141 mmol/L (ref 135–145)
Total Bilirubin: 0.9 mg/dL (ref 0.3–1.2)
Total Protein: 7.5 g/dL (ref 6.5–8.1)

## 2018-08-08 IMAGING — CT CT HEAD WO/W CM
2 of 5 series · 15 of 47 positions shown, 18 images · IV contrast (iopamidol)
Comparison: Report of [HOSPITAL] Brain MRI and MRV 05/07/2012 and
10/30/2011

ADDENDUM:
Critical Value/emergent results were called by telephone at the time
of interpretation on 10/26/2016 at 0709 hours to Dr. TASH MICHELLE GABRIEL MONTALVO ,
who verbally acknowledged these results.
CLINICAL DATA: 55-year-old female with history of severe dural
venous sinus thrombosis in 8905 diagnosed and treated at [HOSPITAL]. By
report, residual clot remained in the right transverse and sigmoid
sinuses on a 0988 follow-up.

Stopped Coumadin 1-2 years ago.
Acute onset headache symptoms and nausea which she reports are very
similar to her 8905 presentation.
EXAM:
CT HEAD WITHOUT CONTRAST
CT VENOGRAM WITH CONTRAST
TECHNIQUE: Contiguous axial images were obtained from the base of the skull
through the vertex without and with intravenous contrast
CONTRAST:  75mL [id number] IOPAMIDOL ([id number]) INJECTION 61%

[Series 3: head bone · axial · 0.41mm/px · z∈[-145,-81]mm · 5 of 72 slices shown]
[im 7/72  bone]
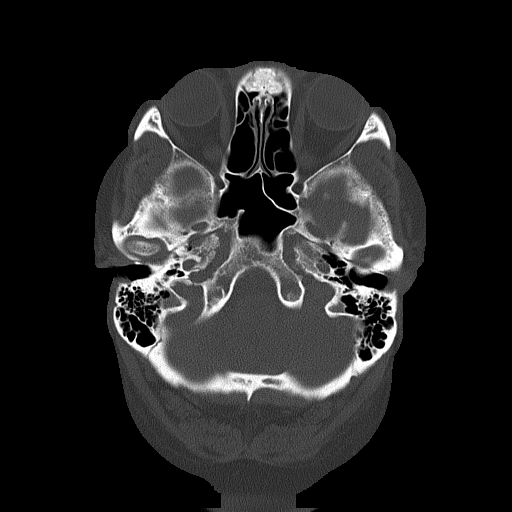
[im 13/72  bone]
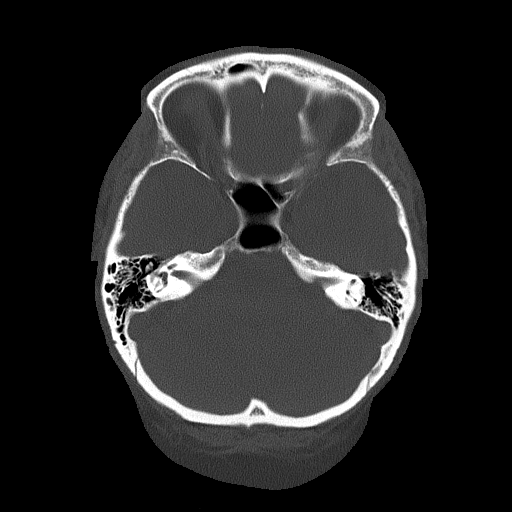
[im 26/72  bone]
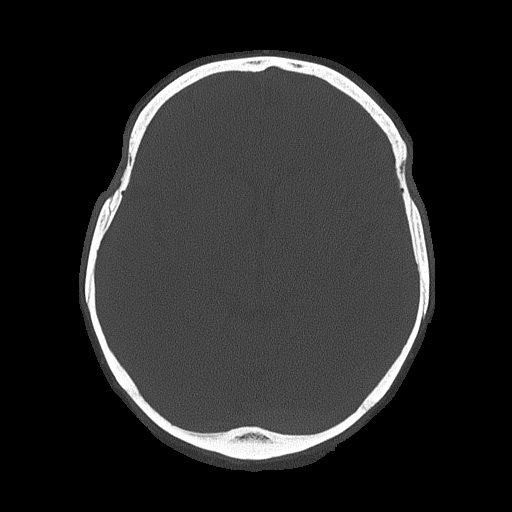
[im 33/72  bone]
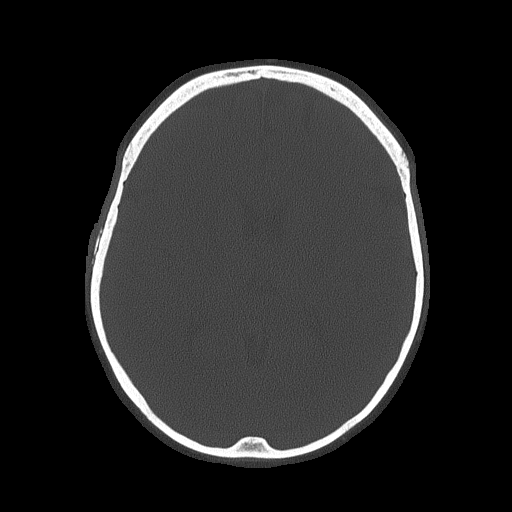
[im 39/72  bone]
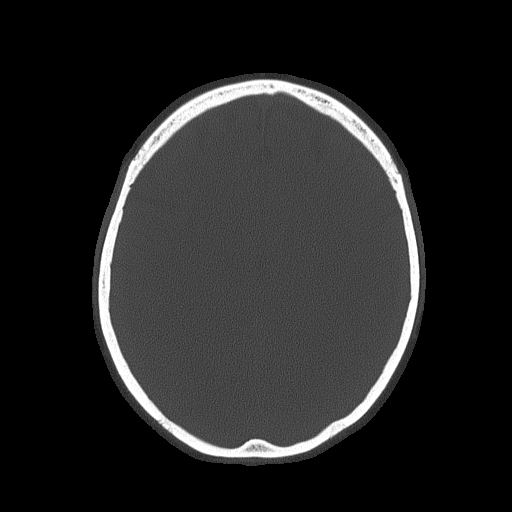

[Series 4: head venogram · axial · 0.38mm/px · z∈[-147,-31]mm · 10 of 72 slices shown, 13 images]
[im 7/72  brain]
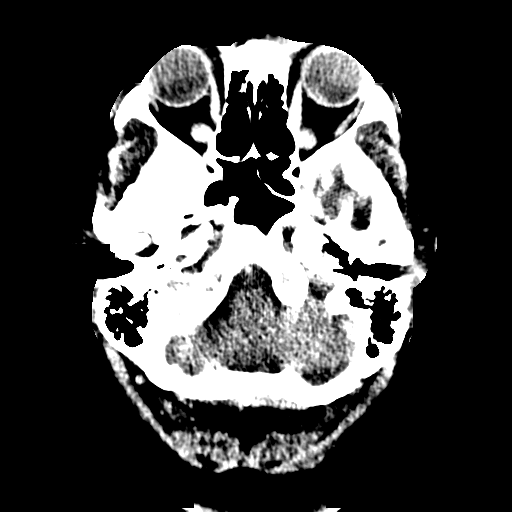
[im 7/72  bone]
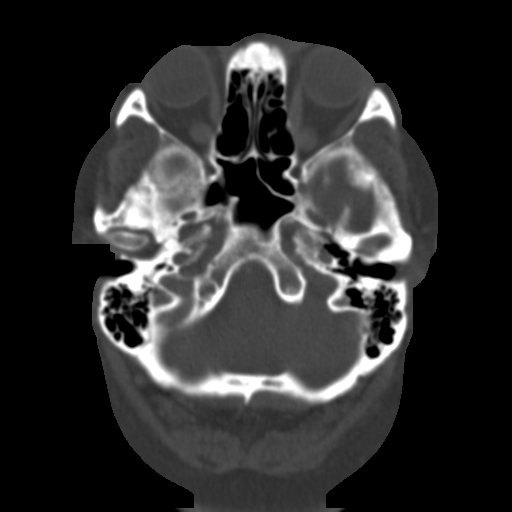
[im 13/72  brain]
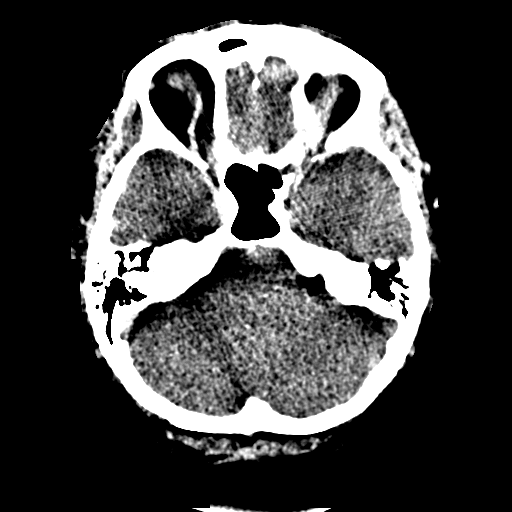
[im 20/72  brain]
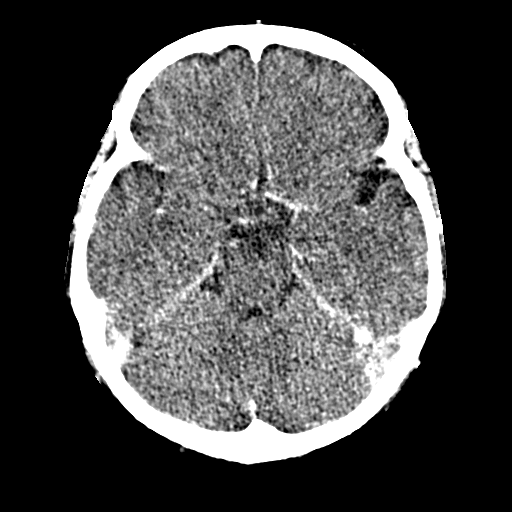
[im 26/72  brain]
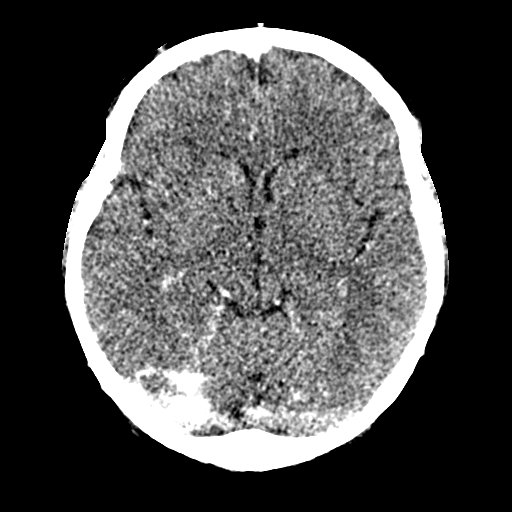
[im 33/72  brain]
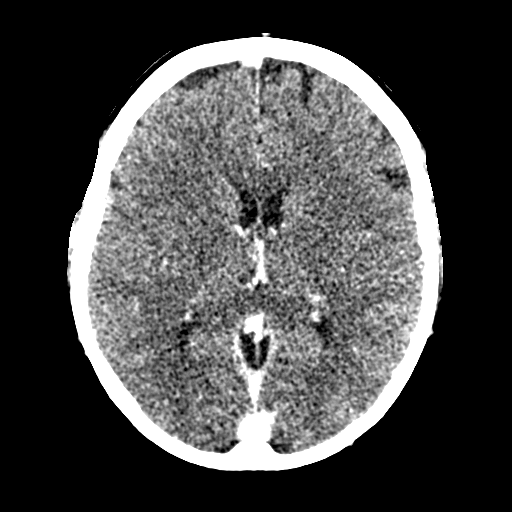
[im 33/72  bone]
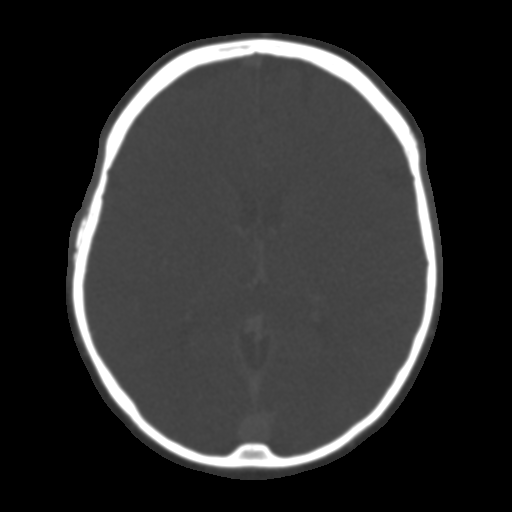
[im 39/72  brain]
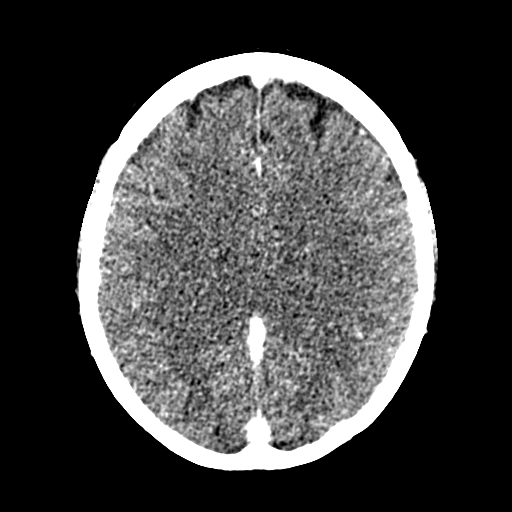
[im 46/72  brain]
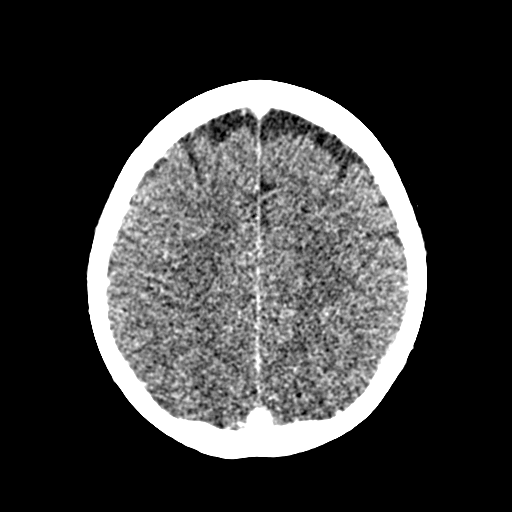
[im 52/72  brain]
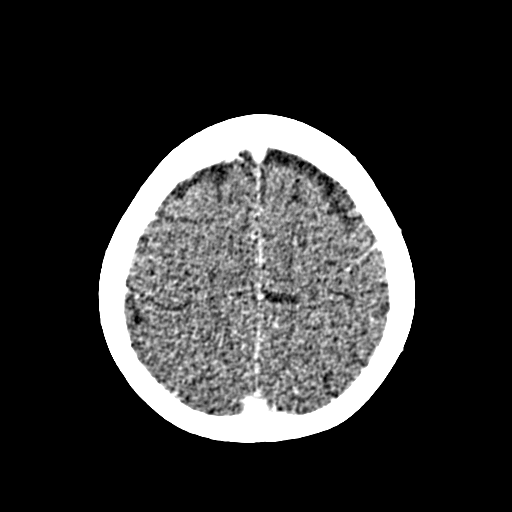
[im 59/72  brain]
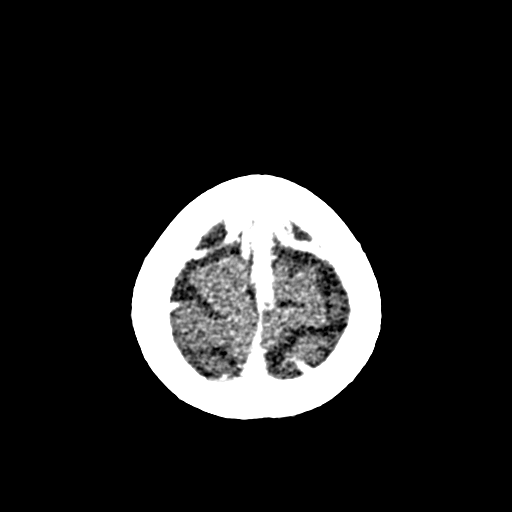
[im 59/72  bone]
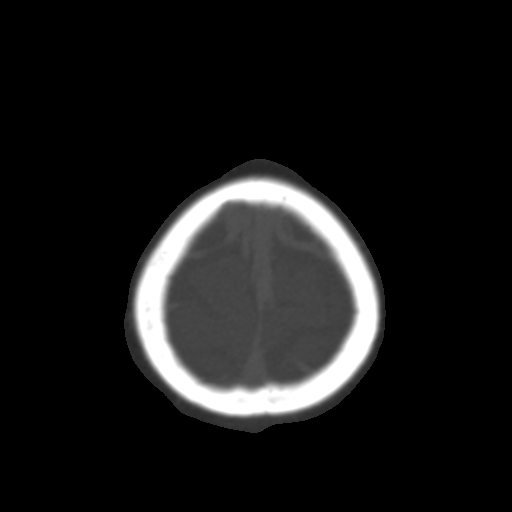
[im 65/72  brain]
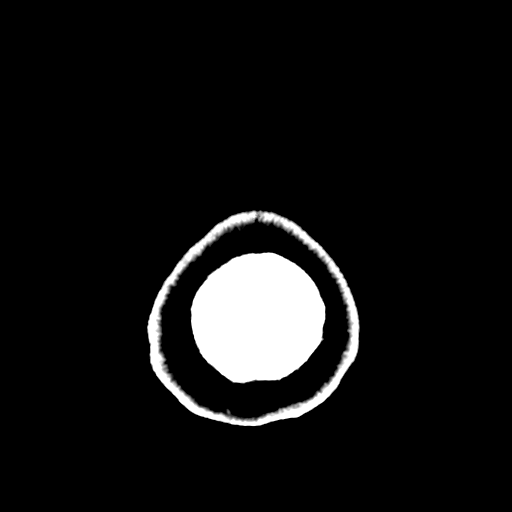

[15 of 47 positions shown; findings below may reference images not displayed]

FINDINGS: CT HEAD

Brain: Cerebral volume is normal. No midline shift,
ventriculomegaly, mass effect, evidence of mass lesion, intracranial
hemorrhage or evidence of cortically based acute infarction.
Gray-white matter differentiation is within normal limits throughout
the brain.

Vascular: Abnormal vascular hyperdensity at the torcula and tracking
along the left transverse and sigmoid sinuses. See below.

Skull: Negative.  No acute osseous abnormality identified.

Sinuses/Orbits: Clear.

Other: Visualized orbits and scalp soft tissues are within normal
limits.

CTV HEAD

Venous sinuses: Low-density filling defect within the torcula, and
the medial aspect of both transverse sinuses (series 4, image 28),
although the right transverse and sigmoid sinuses appear to remain
patent. Low-density thrombus throughout the left transverse and left
sigmoid sinuses to the left IJ bulb, although the superior left IJ
in the neck appears patent on series 4, image 1.

The superior sagittal sinus, straight sinus, inferior sagittal
sinus, vein of [place], internal cerebral veins, and basal veins of
[place] remain patent. There is also preserved enhancement in the
major cortical draining veins including the veins of [place].

Delayed phase: No abnormal parenchymal enhancement identified.

Review of the MIP images confirms the above findings
IMPRESSION: 1. Positive for dural venous sinus thrombosis involving the torcula,
left transverse sinus, left sigmoid sinus, and left IJ bulb.
2. The remaining venous sinuses appear patent.
3. No acute intracranial hemorrhage. No intracranial mass effect. No
CT evidence of infarct at this time.

## 2018-08-08 MED ORDER — CYANOCOBALAMIN 1000 MCG/ML IJ SOLN
1000.0000 ug | INTRAMUSCULAR | Status: DC
  Administered 2018-08-08: 1000 ug via INTRAMUSCULAR

## 2018-08-09 ENCOUNTER — Telehealth: Payer: Self-pay | Admitting: Surgery

## 2018-08-09 NOTE — Telephone Encounter (Signed)
Patient is calling said she got approved for financial assistance she received 100% patient is asking will this cover the whole surgery on financial assistance, it will expire in November 2020.

## 2018-08-11 ENCOUNTER — Other Ambulatory Visit: Payer: Self-pay

## 2018-08-11 ENCOUNTER — Emergency Department
Admission: EM | Admit: 2018-08-11 | Discharge: 2018-08-11 | Disposition: A | Discharge status: Home / Self Care | Payer: Self-pay | Attending: Emergency Medicine | Admitting: Emergency Medicine

## 2018-08-11 ENCOUNTER — Encounter: Payer: Self-pay | Admitting: Emergency Medicine

## 2018-08-11 DIAGNOSIS — I676 Nonpyogenic thrombosis of intracranial venous system: Secondary | ICD-10-CM | POA: Insufficient documentation

## 2018-08-11 DIAGNOSIS — Z7901 Long term (current) use of anticoagulants: Secondary | ICD-10-CM | POA: Insufficient documentation

## 2018-08-11 DIAGNOSIS — R1013 Epigastric pain: Secondary | ICD-10-CM | POA: Insufficient documentation

## 2018-08-11 LAB — CBC
HCT: 43.8 % (ref 36.0–46.0)
Hemoglobin: 13.4 g/dL (ref 12.0–15.0)
MCH: 26 pg (ref 26.0–34.0)
MCHC: 30.6 g/dL (ref 30.0–36.0)
MCV: 85 fL (ref 80.0–100.0)
Platelets: 259 10*3/uL (ref 150–400)
RBC: 5.15 MIL/uL — ABNORMAL HIGH (ref 3.87–5.11)
RDW: 31.6 % — ABNORMAL HIGH (ref 11.5–15.5)
WBC: 10.4 10*3/uL (ref 4.0–10.5)
nRBC: 0 % (ref 0.0–0.2)

## 2018-08-11 LAB — COMPREHENSIVE METABOLIC PANEL
ALT: 11 U/L (ref 0–44)
AST: 35 U/L (ref 15–41)
Albumin: 4.3 g/dL (ref 3.5–5.0)
Alkaline Phosphatase: 79 U/L (ref 38–126)
Anion gap: 13 (ref 5–15)
BUN: 15 mg/dL (ref 6–20)
CO2: 23 mmol/L (ref 22–32)
Calcium: 9.7 mg/dL (ref 8.9–10.3)
Chloride: 104 mmol/L (ref 98–111)
Creatinine, Ser: 0.68 mg/dL (ref 0.44–1.00)
GFR calc Af Amer: >60 mL/min (ref >60)
GFR calc non Af Amer: >60 mL/min (ref >60)
Glucose, Bld: 108 mg/dL — ABNORMAL HIGH (ref 70–99)
Potassium: 5.3 mmol/L — ABNORMAL HIGH (ref 3.5–5.1)
Sodium: 140 mmol/L (ref 135–145)
Total Bilirubin: 1.6 mg/dL — ABNORMAL HIGH (ref 0.3–1.2)
Total Protein: 8.2 g/dL — ABNORMAL HIGH (ref 6.5–8.1)

## 2018-08-11 LAB — LIPASE, BLOOD: Lipase: 48 U/L (ref 11–51)

## 2018-08-11 MED ORDER — ONDANSETRON HCL 4 MG/2ML IJ SOLN
4.0000 mg | Freq: Once | INTRAMUSCULAR | Status: AC
  Administered 2018-08-11: 4 mg via INTRAVENOUS
  Filled 2018-08-11: qty 2

## 2018-08-11 MED ORDER — PANTOPRAZOLE SODIUM 40 MG PO TBEC: 40.0000 mg | DELAYED_RELEASE_TABLET | Freq: Every day | ORAL | 1 refills | Status: DC

## 2018-08-11 NOTE — Discharge Instructions (Addendum)
Continue to take the Protonix as prescribed.  Follow-up with the surgeon as scheduled.  Return to the ER for new, worsening, or persistent severe pain, fever, vomiting, or any other new or worsening symptoms that concern you.

## 2018-08-11 NOTE — ED Notes (Signed)
Pt vomited in triage so medicated with iv zofran. Pt states she called dr. Vicente Males who dx her with hiatal hernia and was told to take maalox and pepcid. Her father went out to buy but could find so she came to er.

## 2018-08-11 NOTE — ED Notes (Signed)
Peripheral IV discontinued. Catheter intact. No signs of infiltration or redness. Gauze applied to IV site.   Discharge instructions reviewed with patient. Questions fielded by this RN. Patient verbalizes understanding of instructions. Patient discharged home in stable condition per siadecki. No acute distress noted at time of discharge.   

## 2018-08-11 NOTE — ED Triage Notes (Signed)
Hx of hiatal hernia, ran out of protonix and now has abd pain.

## 2018-08-11 NOTE — ED Provider Notes (Signed)
[facility] Emergency Department Provider Note ____________________________________________   First MD Initiated Contact with Patient 08/11/18 2005     (approximate)  I have reviewed the triage vital signs and the nursing notes.   HISTORY  Chief Complaint Hiatal Hernia    HPI Jasmine [name] is a 58 y.o. female with PMH as noted below who presents with epigastric abdominal pain, acute onset this evening, and now completely resolved.  The patient states that it resolved after she vomited.  She denies any nausea currently.  She has had no change in her bowel movements.  She reports that the pain is similar to what she has had before related to reflux from her hiatal hernia.  She states she has been out of her Protonix recently.  Past Medical History:  Diagnosis Date  . Anemia   . Arthritis    LEFT SHOULDER  . H/O cerebral venous sinus thrombosis 2013  . History of hiatal hernia   . History of kidney stones     Patient Active Problem List   Diagnosis Date Noted  . Iron deficiency anemia 07/04/2018  . Hiatal hernia   . Anemia 06/30/2018    Past Surgical History:  Procedure Laterality Date  . COLONOSCOPY WITH PROPOFOL N/A 07/02/2018   Procedure: COLONOSCOPY WITH PROPOFOL;  Surgeon: Pasty Spillersahiliani, Varnita B, MD;  Location: [facility] ENDOSCOPY;  Service: Endoscopy;  Laterality: N/A;  . ENTEROSCOPY N/A 07/17/2018   Procedure: ENTEROSCOPY;  Surgeon: Wyline MoodAnna, Kiran, MD;  [facility];  Service: Gastroenterology;  Laterality: N/A;  . ESOPHAGOGASTRODUODENOSCOPY N/A 2001   approximately- for Reflux  . ESOPHAGOGASTRODUODENOSCOPY (EGD) WITH PROPOFOL N/A 07/02/2018   Procedure: ESOPHAGOGASTRODUODENOSCOPY (EGD) WITH PROPOFOL;  Surgeon: Pasty Spillersahiliani, Varnita B, MD;  Location: [facility] ENDOSCOPY;  Service: Endoscopy;  Laterality: N/A;  . ESOPHAGOGASTRODUODENOSCOPY (EGD) WITH PROPOFOL N/A 07/11/2018   Procedure: ESOPHAGOGASTRODUODENOSCOPY (EGD) WITH PROPOFOL;  Surgeon: Sherrilyn Ristanis,  Henry L III, MD;  [facility];  Service: Gastroenterology;  Laterality: N/A;  PT TO HAVE CAPSULE PLACEMENT  . GIVENS CAPSULE STUDY N/A 07/11/2018   Procedure: GIVENS CAPSULE STUDY;  Surgeon: Sherrilyn Ristanis, Henry L III, MD;  [facility];  Service: Gastroenterology;  Laterality: N/A;    Prior to Admission medications   Medication Sig Start Date End Date Taking? Authorizing Provider  pantoprazole (PROTONIX) 40 MG tablet Take 1 tablet (40 mg total) by mouth daily. 08/11/18 10/10/18  Dionne BucySiadecki, Romonia Yanik, MD  rivaroxaban (XARELTO) 20 MG TABS tablet Take 20 mg by mouth daily at 8 pm.    [provider]    Allergies Food, Tape, Povidone iodine, Codeine, and Shellfish allergy  Family History  Problem Relation Age of Onset  . Breast cancer Mother   . Bone cancer Mother     Social History Social History   Tobacco Use  . Smoking status: Never Smoker  . Smokeless tobacco: Never Used  Substance Use Topics  . Alcohol use: Yes    Comment: occasional  . Drug use: Yes    Types: Marijuana    Review of Systems  Constitutional: No fever/chills. Eyes: No visual changes. ENT: No sore throat. Cardiovascular: Denies chest pain. Respiratory: Denies shortness of breath. Gastrointestinal: Positive for resolved vomiting. Genitourinary: Negative for dysuria.  Musculoskeletal: Negative for back pain. Skin: Negative for rash. Neurological: Negative for headache.   ____________________________________________   PHYSICAL EXAM:  VITAL SIGNS: ED Triage Vitals  Enc Vitals Group     BP 08/11/18 2003 (!) 178/91     Pulse Rate 08/11/18 1634 73  Resp 08/11/18 1634 18     Temp 08/11/18 1634 98.3 F (36.8 C)     Temp src --      SpO2 08/11/18 1634 100 %     Weight 08/11/18 1635 191 lb (86.6 kg)     Height 08/11/18 1635 5\' 4"  (1.626 m)     Head Circumference --      Peak Flow --      Pain Score 08/11/18 2003 0     Pain Loc --      Pain Edu? --      Excl. in [place]? --      Constitutional: Alert and oriented. Well appearing and in no acute distress. Eyes: Conjunctivae are normal.  No scleral icterus.   Head: Atraumatic. Nose: No congestion/rhinnorhea. Mouth/Throat: Mucous membranes are moist.   Neck: Normal range of motion.  Cardiovascular: Good peripheral circulation. Respiratory: Normal respiratory effort.  No retractions.  Gastrointestinal: Soft and nontender. No distention.  Musculoskeletal: Extremities warm and well perfused.  Neurologic:  Normal speech and language. No gross focal neurologic deficits are appreciated.  Skin:  Skin is warm and dry. No rash noted. Psychiatric: Mood and affect are normal. Speech and behavior are normal.  ____________________________________________   LABS (all labs ordered are listed, but only abnormal results are displayed)  Labs Reviewed  COMPREHENSIVE METABOLIC PANEL - Abnormal; Notable for the following components:      Result Value   Potassium 5.3 (*)    Glucose, Bld 108 (*)    Total Protein 8.2 (*)    Total Bilirubin 1.6 (*)    All other components within normal limits  CBC - Abnormal; Notable for the following components:   RBC 5.15 (*)    RDW 31.6 (*)    All other components within normal limits  LIPASE, BLOOD  URINALYSIS, COMPLETE (UACMP) WITH MICROSCOPIC   ____________________________________________  EKG   ____________________________________________  RADIOLOGY    ____________________________________________   PROCEDURES  Procedure(s) performed: No  Procedures  Critical Care performed: No ____________________________________________   INITIAL IMPRESSION / ASSESSMENT AND PLAN / ED COURSE  Pertinent labs & imaging results that were available during my care of the patient were reviewed by me and considered in my medical decision making (see chart for details).  58 year old female with PMH as noted above as well as a hiatal hernia presents with an episode of epigastric abdominal  pain lasting a few hours and resolving after she vomited once.  The patient reports similar flares of abdominal pain related to her hiatal hernia in the past.  On exam, the patient is well-appearing.  Her vital signs are normal except for hypertension.  The abdomen is soft and nontender.  Lab work-up is unremarkable (potassium appears falsely elevated due to hemolysis).  Given the resolved symptoms, and the lack of tenderness on exam, and the fact that the patient has had previous episodes before, there is no indication for imaging or further ED work-up.  The patient herself would like to go home.  She requests a new prescription for Protonix which she ran out of.  She has follow-up already arranged with a surgeon for further treatment of her hiatal hernia.  At this time, the patient is stable for discharge home.  Return precautions given, and she expresses understanding.   ____________________________________________   FINAL CLINICAL IMPRESSION(S) / ED DIAGNOSES  Final diagnoses:  Epigastric abdominal pain      NEW MEDICATIONS STARTED DURING THIS VISIT:  Discharge Medication List as of 08/11/2018  9:04 PM       Note:  This document was prepared using Dragon voice recognition software and may include unintentional dictation errors.   Dionne BucySiadecki, Breylin Dom, MD 08/11/18 [id number]

## 2018-08-13 ENCOUNTER — Telehealth: Payer: Self-pay | Admitting: *Deleted

## 2018-08-13 NOTE — Telephone Encounter (Signed)
Called and spoke with the patient and let her know that we were contacting Dr Dahlia Byes to see when we could get her in for surgery as he will need a second surgeon to assist with this case. We will call her back with a surgery date and she will also need to be seen in office with Dr Dahlia Byes prior to surgery.

## 2018-08-13 NOTE — Telephone Encounter (Signed)
Patient called and is ready get surgery scheduled as soon as poosbile, she was at the ED on 08/11/18 with the same problems.

## 2018-08-14 ENCOUNTER — Telehealth: Payer: Self-pay

## 2018-08-14 NOTE — Telephone Encounter (Signed)
Call back to patient to see about scheduling surgery.  Patient's surgery to be scheduled for 08/23/18 at [facility] with Dr. Dahlia Byes. Dr Nestor Lewandowsky will be assisting.   The patient is aware she will need to Pre-Admit on 08/20/18. We will call her with her appointment time. She is aware she will have Covid-19 testing done that day also. Patient will check in at the [place] entrance due to COVID-19 restrictions and will then be escorted to the [place], [address] (first floor).  Patient is aware to stop her Xarelto 2 days prior, her last dose will be on 08/20/18.  She will be seen by Dr Dahlia Byes on 08/20/18 at 2:45 pm for a short pre op visit. She will bring in a copy of her financial assistance letter that day.   Patient aware to be NPO after midnight and have a driver.   She is aware to check in at the [place] entrance where she will be screened for the coronavirus and then sent to Same Day Surgery.   Patient aware that she may have no visitors and driver will need to wait in the car due to COVID-19 restrictions.   The patient verbalizes understanding of the above.   The patient is aware to call the office should she have further questions.

## 2018-08-16 ENCOUNTER — Other Ambulatory Visit: Payer: Self-pay

## 2018-08-16 ENCOUNTER — Telehealth: Payer: Self-pay | Admitting: Gastroenterology

## 2018-08-16 MED ORDER — SUCRALFATE 1 GM/10ML PO SUSP: 1.0000 g | Freq: Four times a day (QID) | ORAL | 0 refills | Status: DC

## 2018-08-16 NOTE — Telephone Encounter (Signed)
Pt called to move her apt for 08/27/18 due to having a surgery on 08/23/18 she did have a Virtual visit with Dr. Vicente Males  Last time and states the connection was bad she ended up at the ER again she would like a call to discuss some symptoms she is having, she is having a hard time eating  Due to having a knot in her stomach please call pt her apt was rescheduled to 09/24/18

## 2018-08-16 NOTE — Telephone Encounter (Signed)
Inform its possible that its from the hernia.   Suggest BID Protonix, carafate QID

## 2018-08-16 NOTE — Telephone Encounter (Signed)
Spoke with pt and inform her of Dr. Georgeann Oppenheim directions to commence on Protonix twice daily and Carafate 4 times daily. Pt agrees. Prescription has been sent to pt preferred pharmacy.

## 2018-08-20 ENCOUNTER — Encounter: Payer: Self-pay | Admitting: Surgery

## 2018-08-20 ENCOUNTER — Telehealth: Payer: Self-pay | Admitting: *Deleted

## 2018-08-20 ENCOUNTER — Ambulatory Visit (INDEPENDENT_AMBULATORY_CARE_PROVIDER_SITE_OTHER): Payer: Self-pay | Admitting: Surgery

## 2018-08-20 ENCOUNTER — Other Ambulatory Visit
Admission: RE | Admit: 2018-08-20 | Discharge: 2018-08-20 | Disposition: A | Discharge status: Home / Self Care | Payer: Self-pay | Source: Ambulatory Visit | Attending: Surgery | Admitting: Surgery

## 2018-08-20 ENCOUNTER — Other Ambulatory Visit: Payer: Self-pay

## 2018-08-20 VITALS — BP 160/85 | HR 61 | Temp 97.9°F | Ht 64.0 in | Wt 185.0 lb

## 2018-08-20 DIAGNOSIS — Z1159 Encounter for screening for other viral diseases: Secondary | ICD-10-CM | POA: Insufficient documentation

## 2018-08-20 DIAGNOSIS — K449 Diaphragmatic hernia without obstruction or gangrene: Secondary | ICD-10-CM

## 2018-08-20 DIAGNOSIS — Z01812 Encounter for preprocedural laboratory examination: Secondary | ICD-10-CM | POA: Insufficient documentation

## 2018-08-20 LAB — CBC WITH DIFFERENTIAL/PLATELET
Abs Immature Granulocytes: 0.03 10*3/uL (ref 0.00–0.07)
Basophils Absolute: 0.1 10*3/uL (ref 0.0–0.1)
Basophils Relative: 1 %
Eosinophils Absolute: 0.4 10*3/uL (ref 0.0–0.5)
Eosinophils Relative: 5 %
HCT: 46.2 % — ABNORMAL HIGH (ref 36.0–46.0)
Hemoglobin: 14.5 g/dL (ref 12.0–15.0)
Immature Granulocytes: 0 %
Lymphocytes Relative: 17 %
Lymphs Abs: 1.6 10*3/uL (ref 0.7–4.0)
MCH: 27.2 pg (ref 26.0–34.0)
MCHC: 31.4 g/dL (ref 30.0–36.0)
MCV: 86.7 fL (ref 80.0–100.0)
Monocytes Absolute: 0.3 10*3/uL (ref 0.1–1.0)
Monocytes Relative: 3 %
Neutro Abs: 6.9 10*3/uL (ref 1.7–7.7)
Neutrophils Relative %: 74 %
Platelets: 223 10*3/uL (ref 150–400)
RBC: 5.33 MIL/uL — ABNORMAL HIGH (ref 3.87–5.11)
RDW: 30.4 % — ABNORMAL HIGH (ref 11.5–15.5)
WBC: 9.3 10*3/uL (ref 4.0–10.5)
nRBC: 0 % (ref 0.0–0.2)

## 2018-08-20 LAB — BASIC METABOLIC PANEL
Anion gap: 11 (ref 5–15)
BUN: 11 mg/dL (ref 6–20)
CO2: 26 mmol/L (ref 22–32)
Calcium: 9.4 mg/dL (ref 8.9–10.3)
Chloride: 105 mmol/L (ref 98–111)
Creatinine, Ser: 0.61 mg/dL (ref 0.44–1.00)
GFR calc Af Amer: >60 mL/min (ref >60)
GFR calc non Af Amer: >60 mL/min (ref >60)
Glucose, Bld: 85 mg/dL (ref 70–99)
Potassium: 3.8 mmol/L (ref 3.5–5.1)
Sodium: 142 mmol/L (ref 135–145)

## 2018-08-20 NOTE — Progress Notes (Signed)
Outpatient Surgical Follow Up  08/20/2018  Jasmine [name] is an 58 y.o. female.   Chief Complaint  Patient presents with  . Pre-op Exam    Hiatal Hernia    HPI: 58 year old female here for preop evaluation for discussion for a hiatal hernia repair and Nissen fundoplication.  This visit was for preoperative counseling mainly.  I have reviewed her images and she does have about a 1/2  of her stomach within the mediastinum.  Fevers no chills. Does have some occasional dysphasia mainly for the state.  She does have persistent reflux symptoms.    Past Medical History:  Diagnosis Date  . Anemia   . Arthritis    LEFT SHOULDER  . H/O cerebral venous sinus thrombosis 2013  . History of hiatal hernia   . History of kidney stones     Past Surgical History:  Procedure Laterality Date  . COLONOSCOPY WITH PROPOFOL N/A 07/02/2018   Procedure: COLONOSCOPY WITH PROPOFOL;  Surgeon: Virgel Manifold, MD;  Location: [facility] ENDOSCOPY;  Service: Endoscopy;  Laterality: N/A;  . ENTEROSCOPY N/A 07/17/2018   Procedure: ENTEROSCOPY;  Surgeon: Jonathon Bellows, MD;  Location: [facility] ENDOSCOPY;  Service: Gastroenterology;  Laterality: N/A;  . ESOPHAGOGASTRODUODENOSCOPY N/A 2001   approximately- for Reflux  . ESOPHAGOGASTRODUODENOSCOPY (EGD) WITH PROPOFOL N/A 07/02/2018   Procedure: ESOPHAGOGASTRODUODENOSCOPY (EGD) WITH PROPOFOL;  Surgeon: Virgel Manifold, MD;  Location: [facility] ENDOSCOPY;  Service: Endoscopy;  Laterality: N/A;  . ESOPHAGOGASTRODUODENOSCOPY (EGD) WITH PROPOFOL N/A 07/11/2018   Procedure: ESOPHAGOGASTRODUODENOSCOPY (EGD) WITH PROPOFOL;  Surgeon: Doran Stabler, MD;  Location: [facility];  Service: Gastroenterology;  Laterality: N/A;  PT TO HAVE CAPSULE PLACEMENT  . GIVENS CAPSULE STUDY N/A 07/11/2018   Procedure: GIVENS CAPSULE STUDY;  Surgeon: Doran Stabler, MD;  Location: [place];  Service: Gastroenterology;  Laterality: N/A;    Family History  Problem Relation Age of Onset  .  Breast cancer Mother   . Bone cancer Mother     Social History:  reports that she has never smoked. She has never used smokeless tobacco. She reports current alcohol use. She reports current drug use. Drug: Marijuana.  Allergies:  Allergies  Allergen Reactions  . Food Other (See Comments)    Pumpkins/squash/zucchini (gourds foods) swelling of the face  . Tape Itching, Dermatitis, Rash and Other (See Comments)    Transpore tape  . Povidone Iodine   . Codeine Other (See Comments)    Crawling skin  . Shellfish Allergy Other (See Comments)    Crawling skin/stomach cramps.    Medications reviewed.    ROS Full ROS performed and is otherwise negative other than what is stated in HPI   BP (!) 160/85   Pulse 61   Temp 97.9 F (36.6 C) (Skin)   Ht 5\' 4"  (1.626 m)   Wt 185 lb (83.9 kg)   SpO2 99%   BMI 31.76 kg/m   Physical Exam Vitals signs and nursing note reviewed. Exam conducted with a chaperone present.  Constitutional:      General: She is not in acute distress.    Appearance: She is normal weight.  Eyes:     General: No scleral icterus.       Right eye: No discharge.        Left eye: No discharge.  Neck:     Musculoskeletal: Normal range of motion. No neck rigidity or muscular tenderness.  Cardiovascular:     Rate and Rhythm: Normal rate and regular rhythm.  Pulses: Normal pulses.  Pulmonary:     Effort: Pulmonary effort is normal. No respiratory distress.     Breath sounds: No stridor.  Abdominal:     General: Abdomen is flat. There is no distension.     Palpations: Abdomen is soft. There is no mass.     Tenderness: There is no abdominal tenderness. There is no guarding or rebound.     Hernia: No hernia is present.  Musculoskeletal: Normal range of motion.  Skin:    General: Skin is warm and dry.     Capillary Refill: Capillary refill takes less than 2 seconds.  Neurological:     General: No focal deficit present.     Mental Status: She is alert and  oriented to person, place, and time.  Psychiatric:        Mood and Affect: Mood normal.        Behavior: Behavior normal.        Thought Content: Thought content normal.        Judgment: Judgment normal.        Results for orders placed or performed during the hospital encounter of 08/20/18 (from the past 48 hour(s))  Basic metabolic panel     Status: None   Collection Time: 08/20/18  1:37 PM  Result Value Ref Range   Sodium 142 135 - 145 mmol/L   Potassium 3.8 3.5 - 5.1 mmol/L   Chloride 105 98 - 111 mmol/L   CO2 26 22 - 32 mmol/L   Glucose, Bld 85 70 - 99 mg/dL   BUN 11 6 - 20 mg/dL   Creatinine, Ser 1.610.61 0.44 - 1.00 mg/dL   Calcium 9.4 8.9 - 09.610.3 mg/dL   GFR calc non Af Amer >60 >60 mL/min   GFR calc Af Amer >60 >60 mL/min   Anion gap 11 5 - 15    Comment: Performed at [facility], [address] [place], [place] [address]  CBC WITH DIFFERENTIAL     Status: Abnormal   Collection Time: 08/20/18  1:37 PM  Result Value Ref Range   WBC 9.3 4.0 - 10.5 K/uL   RBC 5.33 (H) 3.87 - 5.11 MIL/uL   Hemoglobin 14.5 12.0 - 15.0 g/dL   HCT 98.146.2 (H) 19.136.0 - 47.846.0 %   MCV 86.7 80.0 - 100.0 fL   MCH 27.2 26.0 - 34.0 pg   MCHC 31.4 30.0 - 36.0 g/dL   RDW 29.530.4 (H) 62.111.5 - 30.815.5 %   Platelets 223 150 - 400 K/uL   nRBC 0.0 0.0 - 0.2 %   Neutrophils Relative % 74 %   Neutro Abs 6.9 1.7 - 7.7 K/uL   Lymphocytes Relative 17 %   Lymphs Abs 1.6 0.7 - 4.0 K/uL   Monocytes Relative 3 %   Monocytes Absolute 0.3 0.1 - 1.0 K/uL   Eosinophils Relative 5 %   Eosinophils Absolute 0.4 0.0 - 0.5 K/uL   Basophils Relative 1 %   Basophils Absolute 0.1 0.0 - 0.1 K/uL   Immature Granulocytes 0 %   Abs Immature Granulocytes 0.03 0.00 - 0.07 K/uL    Comment: Performed at [facility], [address] [place], [place] [address]   No results found.  Assessment/Plan: 58 year old female with a symptomatic hiatal hernia more than 1/2 of her stomach within the mediastinum. Discussed   with the patient in detail and I do recommend repair.  I do think that she is a good candidate for robotic surgery.  Procedure discussed with the patient in detail.  Risk, benefits and possible complications to include but not limited to: Bleeding, infection, esophageal injury, recurrence, chronic pain, PTX.  She understands and wishes to proceed. We will plan on repair of hiatal hernia with Nissen fundoplication    Greater than 50% of the 25 minutes  visit was spent in counseling/coordination of care   Sterling Bigiego Sondos Wolfman, MD [facility] General Surgeon

## 2018-08-20 NOTE — Telephone Encounter (Signed)
Notified patient as instructed, Labs was norma;. patient pleased. Discussed follow-up appointments, patient agrees

## 2018-08-20 NOTE — Patient Instructions (Signed)
Your procedure is scheduled on: 08/23/18 Report to Day Surgery.[facility] SECOND FLOOR To find out your arrival time please call [phone] between 1PM - 3PM on 08/22/18.  Remember: Instructions that are not followed completely may result in serious medical risk,  up to and including death, or upon the discretion of your surgeon and anesthesiologist your  surgery may need to be rescheduled.     _X__ 1. Do not eat food after midnight the night before your procedure.                 No gum chewing or hard candies. You may drink clear liquids up to 2 hours                 before you are scheduled to arrive for your surgery- DO not drink clear                 liquids within 2 hours of the start of your surgery.                 Clear Liquids include:  water, apple juice without pulp, clear carbohydrate                 drink such as Clearfast of Gatorade, Black Coffee or Tea (Do not add                 anything to coffee or tea).  __X__2.  On the morning of surgery brush your teeth with toothpaste and water, you                may rinse your mouth with mouthwash if you wish.  Do not swallow any toothpaste of mouthwash.     _X__ 3.  No Alcohol for 24 hours before or after surgery.   _X__ 4.  Do Not Smoke or use e-cigarettes For 24 Hours Prior to Your Surgery.                 Do not use any chewable tobacco products for at least 6 hours prior to                 surgery.  ____  5.  Bring all medications with you on the day of surgery if instructed.   ___X_  6.  Notify your doctor if there is any change in your medical condition      (cold, fever, infections).     Do not wear jewelry, make-up, hairpins, clips or nail polish. Do not wear lotions, powders, or perfumes. You may wear deodorant. Do not shave 48 hours prior to surgery. Men may shave face and neck. Do not bring valuables to the hospital.    [facility] is not responsible for any belongings or  valuables.  Contacts, dentures or bridgework may not be worn into surgery. Leave your suitcase in the car. After surgery it may be brought to your room. For patients admitted to the hospital, discharge time is determined by your treatment team.   Patients discharged the day of surgery will not be allowed to drive home.   Please read over the following fact sheets that you were given:   Surgical Site Infection Prevention          ____ Take these medicines the morning of surgery with A SIP OF WATER:    1. NONE  2.   3.   4.  5.  6.  ____ Fleet Enema (as directed)   _X___  Use CHG Soap as directed  ____ Use inhalers on the day of surgery  ____ Stop metformin 2 days prior to surgery    ____ Take 1/2 of usual insulin dose the night before surgery. No insulin the morning          of surgery.   _X___ Stop Coumadin/Plavix/aspirin on    LAST DOSE OF XARELTO ON 08/18/18  ____ Stop Anti-inflammatories on    ____ Stop supplements until after surgery.    ____ Bring C-Pap to the hospital.

## 2018-08-21 LAB — SARS CORONAVIRUS 2 (TAT 6-24 HRS): SARS Coronavirus 2: NEGATIVE

## 2018-08-22 MED ORDER — CEFAZOLIN SODIUM-DEXTROSE 2-4 GM/100ML-% IV SOLN
2.0000 g | INTRAVENOUS | Status: AC
  Administered 2018-08-23: 2 g via INTRAVENOUS

## 2018-08-23 ENCOUNTER — Other Ambulatory Visit: Payer: Self-pay

## 2018-08-23 ENCOUNTER — Encounter: Payer: Self-pay | Admitting: Certified Registered Nurse Anesthetist

## 2018-08-23 ENCOUNTER — Inpatient Hospital Stay
Admission: RE | Admit: 2018-08-23 | Discharge: 2018-08-24 | DRG: 328 | Disposition: A | Discharge status: Home / Self Care | Payer: Self-pay | Attending: Surgery | Admitting: Surgery

## 2018-08-23 ENCOUNTER — Inpatient Hospital Stay: Payer: Self-pay | Admitting: Anesthesiology

## 2018-08-23 ENCOUNTER — Encounter
Admission: RE | Disposition: A | Discharge status: Home / Self Care | Payer: Self-pay | Source: Home / Self Care | Attending: Surgery

## 2018-08-23 DIAGNOSIS — M19012 Primary osteoarthritis, left shoulder: Secondary | ICD-10-CM | POA: Diagnosis present

## 2018-08-23 DIAGNOSIS — K449 Diaphragmatic hernia without obstruction or gangrene: Secondary | ICD-10-CM

## 2018-08-23 DIAGNOSIS — Z91018 Allergy to other foods: Secondary | ICD-10-CM

## 2018-08-23 DIAGNOSIS — Z803 Family history of malignant neoplasm of breast: Secondary | ICD-10-CM

## 2018-08-23 DIAGNOSIS — Z1159 Encounter for screening for other viral diseases: Secondary | ICD-10-CM

## 2018-08-23 DIAGNOSIS — Z91013 Allergy to seafood: Secondary | ICD-10-CM

## 2018-08-23 DIAGNOSIS — K219 Gastro-esophageal reflux disease without esophagitis: Secondary | ICD-10-CM | POA: Diagnosis present

## 2018-08-23 DIAGNOSIS — Z888 Allergy status to other drugs, medicaments and biological substances status: Secondary | ICD-10-CM

## 2018-08-23 DIAGNOSIS — Z885 Allergy status to narcotic agent status: Secondary | ICD-10-CM

## 2018-08-23 DIAGNOSIS — Z9889 Other specified postprocedural states: Secondary | ICD-10-CM | POA: Diagnosis present

## 2018-08-23 HISTORY — PX: HIATAL HERNIA REPAIR: SHX195

## 2018-08-23 LAB — CBC
HCT: 44.6 % (ref 36.0–46.0)
Hemoglobin: 14.1 g/dL (ref 12.0–15.0)
MCH: 27.6 pg (ref 26.0–34.0)
MCHC: 31.6 g/dL (ref 30.0–36.0)
MCV: 87.5 fL (ref 80.0–100.0)
Platelets: 194 10*3/uL (ref 150–400)
RBC: 5.1 MIL/uL (ref 3.87–5.11)
RDW: 28.7 % — ABNORMAL HIGH (ref 11.5–15.5)
WBC: 15.3 10*3/uL — ABNORMAL HIGH (ref 4.0–10.5)
nRBC: 0 % (ref 0.0–0.2)

## 2018-08-23 LAB — URINE DRUG SCREEN, QUALITATIVE (ARMC ONLY)
Amphetamines, Ur Screen: NOT DETECTED
Barbiturates, Ur Screen: NOT DETECTED
Benzodiazepine, Ur Scrn: NOT DETECTED
Cannabinoid 50 Ng, Ur ~~LOC~~: POSITIVE — AB
Cocaine Metabolite,Ur ~~LOC~~: NOT DETECTED
MDMA (Ecstasy)Ur Screen: NOT DETECTED
Methadone Scn, Ur: NOT DETECTED
Opiate, Ur Screen: NOT DETECTED
Phencyclidine (PCP) Ur S: NOT DETECTED
Tricyclic, Ur Screen: NOT DETECTED

## 2018-08-23 LAB — CREATININE, SERUM
Creatinine, Ser: 0.63 mg/dL (ref 0.44–1.00)
GFR calc Af Amer: >60 mL/min (ref >60)
GFR calc non Af Amer: >60 mL/min (ref >60)

## 2018-08-23 SURGERY — FUNDOPLICATION, NISSEN, ROBOT-ASSISTED, LAPAROSCOPIC
Anesthesia: General | Site: Abdomen

## 2018-08-23 MED ORDER — PROCHLORPERAZINE MALEATE 10 MG PO TABS
10.0000 mg | ORAL_TABLET | Freq: Four times a day (QID) | ORAL | Status: DC | PRN
  Filled 2018-08-23: qty 1

## 2018-08-23 MED ORDER — SUGAMMADEX SODIUM 200 MG/2ML IV SOLN
INTRAVENOUS | Status: AC
  Filled 2018-08-23: qty 2

## 2018-08-23 MED ORDER — FENTANYL CITRATE (PF) 100 MCG/2ML IJ SOLN
INTRAMUSCULAR | Status: AC
  Administered 2018-08-23: 25 ug via INTRAVENOUS
  Filled 2018-08-23: qty 2

## 2018-08-23 MED ORDER — CHLORHEXIDINE GLUCONATE CLOTH 2 % EX PADS: 6.0000 | MEDICATED_PAD | Freq: Once | CUTANEOUS | Status: DC

## 2018-08-23 MED ORDER — OXYCODONE HCL 5 MG PO TABS
5.0000 mg | ORAL_TABLET | ORAL | Status: DC | PRN
  Administered 2018-08-23: 10 mg via ORAL
  Administered 2018-08-23: 5 mg via ORAL
  Filled 2018-08-23: qty 1
  Filled 2018-08-23: qty 2

## 2018-08-23 MED ORDER — SODIUM CHLORIDE 0.9 % IV SOLN
INTRAVENOUS | Status: DC
  Administered 2018-08-23: 16:00:00 via INTRAVENOUS

## 2018-08-23 MED ORDER — PROPOFOL 10 MG/ML IV BOLUS
INTRAVENOUS | Status: DC | PRN
  Administered 2018-08-23: 150 mg via INTRAVENOUS

## 2018-08-23 MED ORDER — BUPIVACAINE-EPINEPHRINE 0.25% -1:200000 IJ SOLN
INTRAMUSCULAR | Status: DC | PRN
  Administered 2018-08-23: 30 mL

## 2018-08-23 MED ORDER — DIPHENHYDRAMINE HCL 50 MG/ML IJ SOLN: 12.5000 mg | Freq: Four times a day (QID) | INTRAMUSCULAR | Status: DC | PRN

## 2018-08-23 MED ORDER — KETOROLAC TROMETHAMINE 30 MG/ML IJ SOLN
30.0000 mg | Freq: Four times a day (QID) | INTRAMUSCULAR | Status: DC
  Administered 2018-08-23 – 2018-08-24 (×3): 30 mg via INTRAVENOUS
  Filled 2018-08-23 (×3): qty 1

## 2018-08-23 MED ORDER — ACETAMINOPHEN 160 MG/5ML PO SOLN
325.0000 mg | ORAL | Status: DC | PRN
  Filled 2018-08-23: qty 20.3

## 2018-08-23 MED ORDER — SUCCINYLCHOLINE CHLORIDE 20 MG/ML IJ SOLN
INTRAMUSCULAR | Status: DC | PRN
  Administered 2018-08-23: 80 mg via INTRAVENOUS

## 2018-08-23 MED ORDER — MIDAZOLAM HCL 2 MG/2ML IJ SOLN
INTRAMUSCULAR | Status: AC
  Filled 2018-08-23: qty 2

## 2018-08-23 MED ORDER — GLYCOPYRROLATE 0.2 MG/ML IJ SOLN
INTRAMUSCULAR | Status: AC
  Filled 2018-08-23: qty 1

## 2018-08-23 MED ORDER — FENTANYL CITRATE (PF) 250 MCG/5ML IJ SOLN
INTRAMUSCULAR | Status: AC
  Filled 2018-08-23: qty 5

## 2018-08-23 MED ORDER — LACTATED RINGERS IV SOLN
INTRAVENOUS | Status: DC
  Administered 2018-08-23: 11:00:00 via INTRAVENOUS

## 2018-08-23 MED ORDER — ONDANSETRON HCL 4 MG/2ML IJ SOLN: 4.0000 mg | Freq: Four times a day (QID) | INTRAMUSCULAR | Status: DC | PRN

## 2018-08-23 MED ORDER — ACETAMINOPHEN 500 MG PO TABS
1000.0000 mg | ORAL_TABLET | ORAL | Status: AC
  Administered 2018-08-23: 1000 mg via ORAL

## 2018-08-23 MED ORDER — BUPIVACAINE LIPOSOME 1.3 % IJ SUSP
INTRAMUSCULAR | Status: AC
  Filled 2018-08-23: qty 20

## 2018-08-23 MED ORDER — SUGAMMADEX SODIUM 200 MG/2ML IV SOLN
INTRAVENOUS | Status: DC | PRN
  Administered 2018-08-23: 168 mg via INTRAVENOUS

## 2018-08-23 MED ORDER — BUPIVACAINE LIPOSOME 1.3 % IJ SUSP
INTRAMUSCULAR | Status: DC | PRN
  Administered 2018-08-23: 20 mL

## 2018-08-23 MED ORDER — DEXAMETHASONE SODIUM PHOSPHATE 10 MG/ML IJ SOLN
INTRAMUSCULAR | Status: DC | PRN
  Administered 2018-08-23: 10 mg via INTRAVENOUS

## 2018-08-23 MED ORDER — HEPARIN SODIUM (PORCINE) 5000 UNIT/ML IJ SOLN
INTRAMUSCULAR | Status: AC
  Administered 2018-08-23: 5000 [IU] via SUBCUTANEOUS
  Filled 2018-08-23: qty 1

## 2018-08-23 MED ORDER — SCOPOLAMINE 1 MG/3DAYS TD PT72
1.0000 | MEDICATED_PATCH | TRANSDERMAL | Status: DC
  Administered 2018-08-23: 1.5 mg via TRANSDERMAL

## 2018-08-23 MED ORDER — DIPHENHYDRAMINE HCL 12.5 MG/5ML PO ELIX
12.5000 mg | ORAL_SOLUTION | Freq: Four times a day (QID) | ORAL | Status: DC | PRN
  Filled 2018-08-23: qty 5

## 2018-08-23 MED ORDER — PROMETHAZINE HCL 25 MG/ML IJ SOLN: 6.2500 mg | INTRAMUSCULAR | Status: DC | PRN

## 2018-08-23 MED ORDER — FENTANYL CITRATE (PF) 100 MCG/2ML IJ SOLN
INTRAMUSCULAR | Status: DC | PRN
  Administered 2018-08-23 (×5): 50 ug via INTRAVENOUS

## 2018-08-23 MED ORDER — PROPOFOL 10 MG/ML IV BOLUS
INTRAVENOUS | Status: AC
  Filled 2018-08-23: qty 20

## 2018-08-23 MED ORDER — ACETAMINOPHEN 500 MG PO TABS
1000.0000 mg | ORAL_TABLET | Freq: Four times a day (QID) | ORAL | Status: DC
  Administered 2018-08-23 – 2018-08-24 (×3): 1000 mg via ORAL
  Filled 2018-08-23 (×3): qty 2

## 2018-08-23 MED ORDER — GABAPENTIN 300 MG PO CAPS
300.0000 mg | ORAL_CAPSULE | ORAL | Status: AC
  Administered 2018-08-23: 300 mg via ORAL

## 2018-08-23 MED ORDER — MIDAZOLAM HCL 2 MG/2ML IJ SOLN
INTRAMUSCULAR | Status: DC | PRN
  Administered 2018-08-23: 2 mg via INTRAVENOUS

## 2018-08-23 MED ORDER — HYDRALAZINE HCL 20 MG/ML IJ SOLN: 10.0000 mg | INTRAMUSCULAR | Status: DC | PRN

## 2018-08-23 MED ORDER — MEPERIDINE HCL 50 MG/ML IJ SOLN: 6.2500 mg | INTRAMUSCULAR | Status: DC | PRN

## 2018-08-23 MED ORDER — SCOPOLAMINE 1 MG/3DAYS TD PT72
MEDICATED_PATCH | TRANSDERMAL | Status: AC
  Administered 2018-08-23: 1.5 mg via TRANSDERMAL
  Filled 2018-08-23: qty 1

## 2018-08-23 MED ORDER — ACETAMINOPHEN 325 MG PO TABS: 325.0000 mg | ORAL_TABLET | ORAL | Status: DC | PRN

## 2018-08-23 MED ORDER — LACTATED RINGERS IV SOLN
INTRAVENOUS | Status: DC | PRN
  Administered 2018-08-23: 11:00:00 via INTRAVENOUS

## 2018-08-23 MED ORDER — ROCURONIUM BROMIDE 100 MG/10ML IV SOLN
INTRAVENOUS | Status: DC | PRN
  Administered 2018-08-23: 5 mg via INTRAVENOUS
  Administered 2018-08-23: 20 mg via INTRAVENOUS
  Administered 2018-08-23: 45 mg via INTRAVENOUS
  Administered 2018-08-23: 20 mg via INTRAVENOUS

## 2018-08-23 MED ORDER — CEFAZOLIN SODIUM-DEXTROSE 2-4 GM/100ML-% IV SOLN
INTRAVENOUS | Status: AC
  Filled 2018-08-23: qty 100

## 2018-08-23 MED ORDER — ROCURONIUM BROMIDE 50 MG/5ML IV SOLN
INTRAVENOUS | Status: AC
  Filled 2018-08-23: qty 1

## 2018-08-23 MED ORDER — CELECOXIB 200 MG PO CAPS
ORAL_CAPSULE | ORAL | Status: AC
  Administered 2018-08-23: 200 mg via ORAL
  Filled 2018-08-23: qty 1

## 2018-08-23 MED ORDER — PHENYLEPHRINE HCL (PRESSORS) 10 MG/ML IV SOLN
INTRAVENOUS | Status: AC
  Filled 2018-08-23: qty 1

## 2018-08-23 MED ORDER — DEXAMETHASONE SODIUM PHOSPHATE 10 MG/ML IJ SOLN
INTRAMUSCULAR | Status: AC
  Filled 2018-08-23: qty 1

## 2018-08-23 MED ORDER — ACETAMINOPHEN 500 MG PO TABS
ORAL_TABLET | ORAL | Status: AC
  Administered 2018-08-23: 11:00:00 1000 mg via ORAL
  Filled 2018-08-23: qty 2

## 2018-08-23 MED ORDER — EPHEDRINE SULFATE 50 MG/ML IJ SOLN
INTRAMUSCULAR | Status: AC
  Filled 2018-08-23: qty 1

## 2018-08-23 MED ORDER — ENOXAPARIN SODIUM 40 MG/0.4ML ~~LOC~~ SOLN: 40.0000 mg | SUBCUTANEOUS | Status: DC

## 2018-08-23 MED ORDER — PROCHLORPERAZINE EDISYLATE 10 MG/2ML IJ SOLN
5.0000 mg | Freq: Four times a day (QID) | INTRAMUSCULAR | Status: DC | PRN
  Filled 2018-08-23: qty 2

## 2018-08-23 MED ORDER — MORPHINE SULFATE (PF) 2 MG/ML IV SOLN: 2.0000 mg | INTRAVENOUS | Status: DC | PRN

## 2018-08-23 MED ORDER — BUPIVACAINE LIPOSOME 1.3 % IJ SUSP: 20.0000 mL | Freq: Once | INTRAMUSCULAR | Status: DC

## 2018-08-23 MED ORDER — SODIUM CHLORIDE FLUSH 0.9 % IV SOLN
INTRAVENOUS | Status: AC
  Filled 2018-08-23: qty 10

## 2018-08-23 MED ORDER — GABAPENTIN 300 MG PO CAPS
ORAL_CAPSULE | ORAL | Status: AC
  Administered 2018-08-23: 11:00:00 300 mg via ORAL
  Filled 2018-08-23: qty 1

## 2018-08-23 MED ORDER — FAMOTIDINE 20 MG PO TABS
ORAL_TABLET | ORAL | Status: AC
  Administered 2018-08-23: 20 mg via ORAL
  Filled 2018-08-23: qty 1

## 2018-08-23 MED ORDER — ONDANSETRON 4 MG PO TBDP: 4.0000 mg | ORAL_TABLET | Freq: Four times a day (QID) | ORAL | Status: DC | PRN

## 2018-08-23 MED ORDER — CELECOXIB 200 MG PO CAPS
200.0000 mg | ORAL_CAPSULE | ORAL | Status: AC
  Administered 2018-08-23: 200 mg via ORAL

## 2018-08-23 MED ORDER — EPHEDRINE SULFATE 50 MG/ML IJ SOLN
INTRAMUSCULAR | Status: DC | PRN
  Administered 2018-08-23: 10 mg via INTRAVENOUS

## 2018-08-23 MED ORDER — FAMOTIDINE 20 MG PO TABS
20.0000 mg | ORAL_TABLET | Freq: Once | ORAL | Status: AC
  Administered 2018-08-23: 20 mg via ORAL

## 2018-08-23 MED ORDER — LIDOCAINE HCL (PF) 2 % IJ SOLN
INTRAMUSCULAR | Status: AC
  Filled 2018-08-23: qty 10

## 2018-08-23 MED ORDER — GLYCOPYRROLATE 0.2 MG/ML IJ SOLN
INTRAMUSCULAR | Status: DC | PRN
  Administered 2018-08-23: 0.2 mg via INTRAVENOUS

## 2018-08-23 MED ORDER — BUPIVACAINE-EPINEPHRINE (PF) 0.25% -1:200000 IJ SOLN
INTRAMUSCULAR | Status: AC
  Filled 2018-08-23: qty 30

## 2018-08-23 MED ORDER — HEPARIN SODIUM (PORCINE) 5000 UNIT/ML IJ SOLN
5000.0000 [IU] | Freq: Once | INTRAMUSCULAR | Status: AC
  Administered 2018-08-23: 5000 [IU] via SUBCUTANEOUS

## 2018-08-23 MED ORDER — ONDANSETRON HCL 4 MG/2ML IJ SOLN
INTRAMUSCULAR | Status: AC
  Filled 2018-08-23: qty 2

## 2018-08-23 MED ORDER — KETAMINE HCL 50 MG/ML IJ SOLN
INTRAMUSCULAR | Status: DC | PRN
  Administered 2018-08-23: 50 mg via INTRAMUSCULAR

## 2018-08-23 MED ORDER — ONDANSETRON HCL 4 MG/2ML IJ SOLN
INTRAMUSCULAR | Status: DC | PRN
  Administered 2018-08-23: 4 mg via INTRAVENOUS

## 2018-08-23 MED ORDER — LIDOCAINE HCL (CARDIAC) PF 100 MG/5ML IV SOSY
PREFILLED_SYRINGE | INTRAVENOUS | Status: DC | PRN
  Administered 2018-08-23: 80 mg via INTRAVENOUS

## 2018-08-23 MED ORDER — FENTANYL CITRATE (PF) 100 MCG/2ML IJ SOLN
25.0000 ug | INTRAMUSCULAR | Status: AC | PRN
  Administered 2018-08-23 (×6): 25 ug via INTRAVENOUS

## 2018-08-23 MED ORDER — SODIUM CHLORIDE 0.9 % IV SOLN
INTRAVENOUS | Status: DC | PRN
  Administered 2018-08-23: 20 ug/min via INTRAVENOUS

## 2018-08-23 SURGICAL SUPPLY — 47 items
CANISTER SUCT 1200ML W/VALVE (MISCELLANEOUS) ×2 IMPLANT
CHLORAPREP W/TINT 26 (MISCELLANEOUS) ×2 IMPLANT
COVER WAND RF STERILE (DRAPES) ×2 IMPLANT
DECANTER SPIKE VIAL GLASS SM (MISCELLANEOUS) ×1 IMPLANT
DEFOGGER SCOPE WARMER CLEARIFY (MISCELLANEOUS) ×2 IMPLANT
DERMABOND ADVANCED (GAUZE/BANDAGES/DRESSINGS) ×1
DERMABOND ADVANCED .7 DNX12 (GAUZE/BANDAGES/DRESSINGS) ×1 IMPLANT
DRAPE ARM DVNC X/XI (DISPOSABLE) ×4 IMPLANT
DRAPE COLUMN DVNC XI (DISPOSABLE) ×1 IMPLANT
DRAPE DA VINCI XI ARM (DISPOSABLE) ×4
DRAPE DA VINCI XI COLUMN (DISPOSABLE) ×1
DRAPE SHEET LG 3/4 BI-LAMINATE (DRAPES) ×2 IMPLANT
ELECT CAUTERY BLADE 6.4 (BLADE) ×2 IMPLANT
ELECT REM PT RETURN 9FT ADLT (ELECTROSURGICAL) ×2
ELECTRODE REM PT RTRN 9FT ADLT (ELECTROSURGICAL) ×1 IMPLANT
GLOVE BIO SURGEON STRL SZ7 (GLOVE) ×6 IMPLANT
GOWN STRL REUS W/ TWL LRG LVL3 (GOWN DISPOSABLE) ×4 IMPLANT
GOWN STRL REUS W/TWL LRG LVL3 (GOWN DISPOSABLE) ×4
IRRIGATION STRYKERFLOW (MISCELLANEOUS) IMPLANT
IRRIGATOR STRYKERFLOW (MISCELLANEOUS)
IV NS 1000ML (IV SOLUTION)
IV NS 1000ML BAXH (IV SOLUTION) IMPLANT
KIT PINK PAD W/HEAD ARE REST (MISCELLANEOUS) ×2
KIT PINK PAD W/HEAD ARM REST (MISCELLANEOUS) ×1 IMPLANT
LABEL OR SOLS (LABEL) ×2 IMPLANT
NEEDLE HYPO 22GX1.5 SAFETY (NEEDLE) ×2 IMPLANT
OBTURATOR OPTICAL STANDARD 8MM (TROCAR) ×1
OBTURATOR OPTICAL STND 8 DVNC (TROCAR) ×1
OBTURATOR OPTICALSTD 8 DVNC (TROCAR) ×1 IMPLANT
PACK LAP CHOLECYSTECTOMY (MISCELLANEOUS) ×2 IMPLANT
PENCIL ELECTRO HAND CTR (MISCELLANEOUS) ×2 IMPLANT
SEAL CANN UNIV 5-8 DVNC XI (MISCELLANEOUS) ×4 IMPLANT
SEAL XI 5MM-8MM UNIVERSAL (MISCELLANEOUS) ×4
SEALER VESSEL DA VINCI XI (MISCELLANEOUS) ×1
SEALER VESSEL EXT DVNC XI (MISCELLANEOUS) IMPLANT
SOLUTION ELECTROLUBE (MISCELLANEOUS) ×2 IMPLANT
SPONGE LAP 18X18 RF (DISPOSABLE) ×2 IMPLANT
SUT MNCRL 4-0 (SUTURE) ×2
SUT MNCRL 4-0 27XMFL (SUTURE) ×2
SUT SILK 2 0 SH (SUTURE) ×4 IMPLANT
SUT VICRYL 0 AB UR-6 (SUTURE) ×4 IMPLANT
SUT VLOC 90 S/L VL9 GS22 (SUTURE) ×4 IMPLANT
SUTURE MNCRL 4-0 27XMF (SUTURE) ×1 IMPLANT
TRAY FOLEY SLVR 16FR LF STAT (SET/KITS/TRAYS/PACK) ×2 IMPLANT
TROCAR 130MM GELPORT  DAV (MISCELLANEOUS) ×2 IMPLANT
TROCAR XCEL NON-BLD 5MMX100MML (ENDOMECHANICALS) ×1 IMPLANT
TUBING EVAC SMOKE HEATED PNEUM (TUBING) ×2 IMPLANT

## 2018-08-23 NOTE — Transfer of Care (Signed)
Immediate Anesthesia Transfer of Care Note  Patient: Jasmine [name]  Procedure(s) Performed: XI ROBOTIC ASSISTED HIATAL HERNIA REPAIR  WITH FUNDOPLICATION (N/A Abdomen)  Patient Location: PACU  Anesthesia Type:General  Level of Consciousness: drowsy  Airway & Oxygen Therapy: Patient Spontanous Breathing and Patient connected to face mask oxygen  Post-op Assessment: Report given to RN and Post -op Vital signs reviewed and stable  Post vital signs: Reviewed and stable  Last Vitals:  Vitals Value Taken Time  BP 130/81 08/23/18 1359  Temp 36.1 C 08/23/18 1359  Pulse 76 08/23/18 1404  Resp 16 08/23/18 1404  SpO2 100 % 08/23/18 1404  Vitals shown include unvalidated device data.  Last Pain:  Vitals:   08/23/18 1359  TempSrc:   PainSc: Asleep         Complications: No apparent anesthesia complications

## 2018-08-23 NOTE — Anesthesia Post-op Follow-up Note (Signed)
Anesthesia QCDR form completed.        

## 2018-08-23 NOTE — Anesthesia Preprocedure Evaluation (Signed)
Anesthesia Evaluation  Patient identified by MRN, date of birth, ID band Patient awake    Reviewed: Allergy & Precautions, H&P , NPO status , reviewed documented beta blocker date and time   Airway Mallampati: II  TM Distance: >3 FB Neck ROM: full    Dental  (+) Chipped, Poor Dentition, Missing, Loose Pt states lower incisors "loose", no visible movement when pressure applied. Discussed possible removal if felt necessary. Pt accepts:   Pulmonary    Pulmonary exam normal        Cardiovascular Normal cardiovascular exam  Mild IVCD on EKG   Neuro/Psych    GI/Hepatic hiatal hernia, GERD  ,  Endo/Other    Renal/GU      Musculoskeletal  (+) Arthritis ,   Abdominal   Peds  Hematology  (+) Blood dyscrasia, anemia ,   Anesthesia Other Findings Past Medical History: No date: Anemia No date: Arthritis     Comment:  LEFT SHOULDER 2013: H/O cerebral venous sinus thrombosis No date: History of hiatal hernia No date: History of kidney stones Past Surgical History: 07/02/2018: COLONOSCOPY WITH PROPOFOL; N/A     Comment:  Procedure: COLONOSCOPY WITH PROPOFOL;  Surgeon:               Pasty Spillersahiliani, Varnita B, MD;  Location: [facility] ENDOSCOPY;                Service: Endoscopy;  Laterality: N/A; 07/17/2018: ENTEROSCOPY; N/A     Comment:  Procedure: ENTEROSCOPY;  Surgeon: Wyline MoodAnna, Kiran, MD;                [facility];  Service: Gastroenterology;                Laterality: N/A; 2001: ESOPHAGOGASTRODUODENOSCOPY; N/A     Comment:  approximately- for Reflux 07/02/2018: ESOPHAGOGASTRODUODENOSCOPY (EGD) WITH PROPOFOL; N/A     Comment:  Procedure: ESOPHAGOGASTRODUODENOSCOPY (EGD) WITH               PROPOFOL;  Surgeon: Pasty Spillersahiliani, Varnita B, MD;  Location:               [facility] ENDOSCOPY;  Service: Endoscopy;  Laterality: N/A; 07/11/2018: ESOPHAGOGASTRODUODENOSCOPY (EGD) WITH PROPOFOL; N/A     Comment:  Procedure:  ESOPHAGOGASTRODUODENOSCOPY (EGD) WITH               PROPOFOL;  Surgeon: Sherrilyn Ristanis, Henry L III, MD;  Location: [facility]              ENDOSCOPY;  Service: Gastroenterology;  Laterality: N/A;               PT TO HAVE CAPSULE PLACEMENT 07/11/2018: GIVENS CAPSULE STUDY; N/A     Comment:  Procedure: GIVENS CAPSULE STUDY;  Surgeon: Sherrilyn Ristanis, Henry               L III, MD;  Location: [facility] ENDOSCOPY;  Service:               Gastroenterology;  Laterality: N/A; BMI    Body Mass Index: 31.79 kg/m     Reproductive/Obstetrics                             Anesthesia Physical Anesthesia Plan  ASA: III  Anesthesia Plan: General   Post-op Pain Management:    Induction: Intravenous  PONV Risk Score and Plan: 4 or greater and Ondansetron, Treatment may vary due to age or medical condition, Midazolam, Dexamethasone and Scopolamine  patch - Pre-op  Airway Management Planned: Oral ETT  Additional Equipment:   Intra-op Plan:   Post-operative Plan: Extubation in OR  Informed Consent: I have reviewed the patients History and Physical, chart, labs and discussed the procedure including the risks, benefits and alternatives for the proposed anesthesia with the patient or authorized representative who has indicated his/her understanding and acceptance.     Dental Advisory Given  Plan Discussed with: CRNA  Anesthesia Plan Comments:         Anesthesia Quick Evaluation

## 2018-08-23 NOTE — Anesthesia Procedure Notes (Signed)
Procedure Name: Intubation Date/Time: 08/23/2018 11:35 AM Performed by: Eben Burow, CRNA Pre-anesthesia Checklist: Patient identified, Emergency Drugs available, Suction available and Patient being monitored Patient Re-evaluated:Patient Re-evaluated prior to induction Oxygen Delivery Method: Circle system utilized Preoxygenation: Pre-oxygenation with 100% oxygen Induction Type: IV induction Ventilation: Mask ventilation without difficulty Laryngoscope Size: McGraph and 3 Grade View: Grade I Tube type: Oral Tube size: 7.5 mm Number of attempts: 2 Airway Equipment and Method: Stylet and Video-laryngoscopy Placement Confirmation: positive ETCO2 and breath sounds checked- equal and bilateral Secured at: 22 cm Tube secured with: Tape Dental Injury: Teeth and Oropharynx as per pre-operative assessment

## 2018-08-23 NOTE — Interval H&P Note (Signed)
History and Physical Interval Note:  08/23/2018 10:54 AM  Jasmine [name]  has presented today for surgery, with the diagnosis of HIATAL HERNIA.  The various methods of treatment have been discussed with the patient and family. After consideration of risks, benefits and other options for treatment, the patient has consented to  Procedure(s): XI ROBOTIC ASSISTED HIATAL HERNIA REPAIR  WITH FUNDOPLICATION (N/A) as a surgical intervention.  The patient's history has been reviewed, patient examined, no change in status, stable for surgery.  I have reviewed the patient's chart and labs.  Questions were answered to the patient's satisfaction.     [place]

## 2018-08-23 NOTE — Op Note (Signed)
Robotic assisted laparoscopic Nissen fundoplication w repair of  paraesophageal hernia  Pre-operative Diagnosis: GERD, type III paraesophageal  hernia  Post-operative Diagnosis: same  Procedure:  Robotic assisted laparoscopic Nissen fundoplication w repair of  Paraesophageal hernia  Surgeon: Sterling Bigiego Pabon, MD FACS  Assistant: Dr. Thelma Bargeaks. Required due to the complexity of the case the need for exposure and lack of first assist.  Anesthesia: Gen. with endotracheal tube  Findings: Large Paraesophageal hernia with 1/2 of her stomach within the mediastinum. Loose wrap 360 degree over 50 FR Bougie   Estimated Blood Loss: 25cc       Specimens: Gallbladder           Complications: none   Procedure Details  The patient was seen again in the Holding Room. The benefits, complications, treatment options, and expected outcomes were discussed with the patient. The risks of bleeding, infection, recurrence of symptoms, failure to resolve symptoms,  esophageal damage, Dysphagia, bowel injury, any of which could require further surgery were reviewed with the patient. The likelihood of improving the patient's symptoms with return to their baseline status is good.  The patient and/or family concurred with the proposed plan, giving informed consent.  The patient was taken to Operating Room, identified  and the procedure verified.  A Time Out was held and the above information confirmed.  Prior to the induction of general anesthesia, antibiotic prophylaxis was administered. VTE prophylaxis was in place. General endotracheal anesthesia was then administered and tolerated well. After the induction, the abdomen was prepped with Chloraprep and draped in the sterile fashion. The patient was positioned in the supine position.  Cut down technique was used to enter the abdominal cavity and a Hasson trochar was placed after two vicryl stitches were anchored to the fascia. Pneumoperitoneum was then created with CO2 and  tolerated well without any adverse changes in the patient's vital signs.  Three 8-mm ports were placed under direct vision. All skin incisions  were infiltrated with a local anesthetic agent before making the incision and placing the trocars. An additional 5 mm regular laparoscopic port was placed to assist with retraction and exposure.   The patient was positioned  in reverse Trendelenburg, robot was brought to the surgical field and docked in the standard fashion.  We made sure all the instrumentation was kept indirect view at all times and that there were no collision between the arms. I scrubbed out and went to the console.  I used a robotic arm to retract the liver, the vessel sealer on my right hand and a force bipolar grasper on my left hand.  These along with the extra 5 mm port allowed me ample exposure and the ability to perform meticulous dissection  We Started dividing the lesser omentum via the pars flaccida.  We Were able to dissect the lesser curvature of the stomach and  dissected the fundus free from the right and left crus.  We circumferentially dissected the GE junction.  The hernia sac was also completely reduced and we were able to bring the stomach into the intra-abdominal position.  Attention then was turned to the greater curvature where the short gastrics were divided with sealer device.  We were able to identify the left crus and again were able to make sure there was a good circumferential dissection and that the hernia sac was completely excised.  We did perform also some dissection within the mediastinum to allow a complete reduction of the sac and a to completely allow an intra-abdominal  Nissen fundoplication.  2-0V lock suture was inserted and the crus as well as the hernia was closed with a running suture  We Asked anesthesia to place a 50 French bougie and this went easily.  We also observe trajectory of the bougie. 360 degree Nissen fundoplication was created with  multiple 2-0 silk sutures and we placed 3 stitches taking some of the esophagus within that bite.  The fundoplication measured approximately 3-1/2 cm and he was floppy. I was very happy with the way the fundoplication laid and the repair of the hernia.  Inspection of the  upper quadrant was performed. No bleeding, bile  Or esophageal injuries leaks, or bowel injuries were noted. Robotic instruments and robotic arms were undocked in the standard fashion. All the needles were removed under direct visualization.   I scrubbed back in.  Pneumoperitoneum was released.  The periumbilical port site was closed with interrumpted 0 Vicryl sutures. 4-0 subcuticular Monocryl was used to close the skin. Liposomal marcaine was injected to all the incisions sites.  Dermabond was  applied.  The patient was then extubated and brought to the recovery room in stable condition. Sponge, lap, and needle counts were correct at closure and at the conclusion of the case.               Caroleen Hamman, MD, FACS

## 2018-08-24 ENCOUNTER — Telehealth: Payer: Self-pay | Admitting: Surgery

## 2018-08-24 LAB — BASIC METABOLIC PANEL
Anion gap: 10 (ref 5–15)
BUN: 9 mg/dL (ref 6–20)
CO2: 25 mmol/L (ref 22–32)
Calcium: 9.3 mg/dL (ref 8.9–10.3)
Chloride: 104 mmol/L (ref 98–111)
Creatinine, Ser: 0.61 mg/dL (ref 0.44–1.00)
GFR calc Af Amer: >60 mL/min (ref >60)
GFR calc non Af Amer: >60 mL/min (ref >60)
Glucose, Bld: 125 mg/dL — ABNORMAL HIGH (ref 70–99)
Potassium: 4.4 mmol/L (ref 3.5–5.1)
Sodium: 139 mmol/L (ref 135–145)

## 2018-08-24 LAB — CBC
HCT: 44.3 % (ref 36.0–46.0)
Hemoglobin: 14 g/dL (ref 12.0–15.0)
MCH: 27.9 pg (ref 26.0–34.0)
MCHC: 31.6 g/dL (ref 30.0–36.0)
MCV: 88.2 fL (ref 80.0–100.0)
Platelets: 224 10*3/uL (ref 150–400)
RBC: 5.02 MIL/uL (ref 3.87–5.11)
RDW: 28.7 % — ABNORMAL HIGH (ref 11.5–15.5)
WBC: 11.6 10*3/uL — ABNORMAL HIGH (ref 4.0–10.5)
nRBC: 0 % (ref 0.0–0.2)

## 2018-08-24 LAB — MAGNESIUM: Magnesium: 1.8 mg/dL (ref 1.7–2.4)

## 2018-08-24 MED ORDER — OXYCODONE HCL 5 MG PO TABS: 5.0000 mg | ORAL_TABLET | ORAL | 0 refills | Status: DC | PRN

## 2018-08-24 NOTE — Telephone Encounter (Signed)
Patient has called to follow up on her FMLA forms. She was discharged today. Please contact patient once completed.

## 2018-08-24 NOTE — Discharge Summary (Signed)
[facility] SURGICAL DISCHARGE SUMMARY  Patient ID: Nija Koopman MRN: [id number] DOB/AGE: October 24, 1960 58 y.o.  Admit date: 08/23/2018 Discharge date: 08/24/2018  Discharge Diagnoses Patient Active Problem List   Diagnosis Date Noted  . S/P Nissen fundoplication (without gastrostomy tube) procedure 08/23/2018  . Iron deficiency anemia 07/04/2018  . Hiatal hernia   . Anemia 06/30/2018    Consultants None  Procedures 08/23/2018:  Robotic-assisted Nissen Fundoplication with repair of paraesophageal hernia  HPI: Susie Ehresman is a 59 y.o. female with a history of hiatal hernia and associated symptoms who presents to [facility] on 07/09 for scheduled hiatal hernia repair with Dr Dahlia Byes.   Hospital Course: Informed consent was obtained and documented, and patient underwent uneventful hiatal hernia repair (Dr Dahlia Byes, 08/23/2018).  Post-operatively, patient's pain improved/resolved and advancement of patient's diet and ambulation were well-tolerated. The remainder of patient's hospital course was essentially unremarkable, and discharge planning was initiated accordingly with patient safely able to be discharged home with appropriate discharge instructions including dietary restrictions, pain control, and outpatient follow-up after all of her questions were answered to her expressed satisfaction.   Discharge Condition: Good   Physical Examination:  Constitutional: Well appearing female, NAD Pulmonary: Normal effort, no respiratory distress Gastrointestinal: Soft, expected post-op soreness at incisions, non-distended. No rebound/guarding Skin: Laparoscopic incisions are CDI, some ecchymosis, no erythema or drainage.    Allergies as of 08/24/2018      Reactions   Food Other (See Comments)   Pumpkins/squash/zucchini (gourds foods) swelling of the face   Tape Itching, Dermatitis, Rash, Other (See Comments)   Transpore tape   Povidone Iodine    Codeine Other (See Comments)   Crawling skin   Shellfish Allergy Other (See Comments)   Crawling skin/stomach cramps.      Medication List    STOP taking these medications   pantoprazole 40 MG tablet Commonly known as: PROTONIX     TAKE these medications   oxyCODONE 5 MG immediate release tablet Commonly known as: Oxy IR/ROXICODONE Take 1-2 tablets (5-10 mg total) by mouth every 4 (four) hours as needed for moderate pain.   rivaroxaban 20 MG Tabs tablet Commonly known as: XARELTO Take 20 mg by mouth daily at 8 pm.   sucralfate 1 GM/10ML suspension Commonly known as: CARAFATE Take 10 mLs (1 g total) by mouth 4 (four) times daily.        Follow-up Information    Pabon, [place] F, MD. Schedule an appointment as soon as possible for a visit in 2 week(s).   Specialty: General Surgery Why: s/p robotic hiatal hernia repair and nissen fundoplication Contact information: [address] [place] [address] [phone]            Time spent on discharge management including discussion of hospital course, clinical condition, outpatient instructions, prescriptions, and follow up with the patient and members of the medical team: >30 minutes  -- Edison Simon , PA-C [facility]  08/24/2018, 8:42 AM [phone] M-F: 7am - 4pm

## 2018-08-24 NOTE — Telephone Encounter (Signed)
Talked to patient about her FMLA paperwork faxed .

## 2018-08-27 ENCOUNTER — Ambulatory Visit: Payer: Self-pay | Admitting: Gastroenterology

## 2018-08-30 NOTE — Anesthesia Postprocedure Evaluation (Signed)
Anesthesia Post Note  Patient: Jasmine [name]  Procedure(s) Performed: XI ROBOTIC ASSISTED HIATAL HERNIA REPAIR  WITH FUNDOPLICATION (N/A Abdomen)  Patient location during evaluation: PACU Anesthesia Type: General Level of consciousness: awake and alert Pain management: pain level controlled Vital Signs Assessment: post-procedure vital signs reviewed and stable Respiratory status: spontaneous breathing, nonlabored ventilation, respiratory function stable and patient connected to nasal cannula oxygen Cardiovascular status: blood pressure returned to baseline and stable Postop Assessment: no apparent nausea or vomiting Anesthetic complications: no     Last Vitals:  Vitals:   08/23/18 2007 08/24/18 0429  BP: (!) 155/74 (!) 145/56  Pulse: (!) 52 (!) 44  Resp: 18 18  Temp: 36.5 C (!) 36.3 C  SpO2: 98% 99%    Last Pain:  Vitals:   08/24/18 0730  TempSrc:   PainSc: 4                  Purl Claytor Harvie Heck

## 2018-09-05 ENCOUNTER — Ambulatory Visit (INDEPENDENT_AMBULATORY_CARE_PROVIDER_SITE_OTHER): Payer: Self-pay | Admitting: Surgery

## 2018-09-05 ENCOUNTER — Encounter: Payer: Self-pay | Admitting: Surgery

## 2018-09-05 ENCOUNTER — Other Ambulatory Visit: Payer: Self-pay

## 2018-09-05 VITALS — BP 148/90 | HR 65 | Temp 98.1°F | Resp 16 | Ht 64.0 in | Wt 185.2 lb

## 2018-09-05 DIAGNOSIS — Z09 Encounter for follow-up examination after completed treatment for conditions other than malignant neoplasm: Secondary | ICD-10-CM

## 2018-09-05 NOTE — Progress Notes (Signed)
S/p robotic paraesophageal Type III Doing very well Some mild icisional pain No dysphagia Taking soft diet well No fevers or chills  PE NAD Abd: soft, nt, incisions c/d/i, no infection or peritonitis  A/p Doing very well No complications Continue post Nissen Diet F/U prn

## 2018-09-05 NOTE — Patient Instructions (Signed)

## 2018-09-06 ENCOUNTER — Telehealth: Payer: Self-pay

## 2018-09-06 NOTE — Telephone Encounter (Signed)
FMLA paperowrk completed and faxed to Retail services [phone]. Patient notified and will pick up copy at front desk.

## 2018-09-11 ENCOUNTER — Ambulatory Visit: Payer: Self-pay

## 2018-09-11 ENCOUNTER — Other Ambulatory Visit: Payer: Self-pay

## 2018-09-11 ENCOUNTER — Ambulatory Visit: Payer: Self-pay | Admitting: Oncology

## 2018-09-24 ENCOUNTER — Ambulatory Visit (INDEPENDENT_AMBULATORY_CARE_PROVIDER_SITE_OTHER): Payer: Self-pay | Admitting: Gastroenterology

## 2018-09-24 ENCOUNTER — Other Ambulatory Visit: Payer: Self-pay

## 2018-09-24 VITALS — BP 149/98 | HR 69 | Temp 98.1°F | Ht 64.0 in | Wt 180.4 lb

## 2018-09-24 DIAGNOSIS — K219 Gastro-esophageal reflux disease without esophagitis: Secondary | ICD-10-CM

## 2018-09-24 DIAGNOSIS — D509 Iron deficiency anemia, unspecified: Secondary | ICD-10-CM

## 2018-09-24 MED ORDER — OMEPRAZOLE 20 MG PO CPDR: 20.0000 mg | DELAYED_RELEASE_CAPSULE | Freq: Every day | ORAL | 3 refills | Status: DC

## 2018-09-24 NOTE — Progress Notes (Signed)
Wyline MoodKiran Katharine Rochefort MD, MRCP(U.K) [address]  [address]  [place], [place] [address]  Main: [phone]  Fax: [phone]   Primary Care Physician: Patient, No Pcp Per  Primary Gastroenterologist:  Dr. Wyline MoodKiran Glover Capano  Follow-up for iron deficiency anemia  HPI: Jasmine [name] is a 58 y.o. female   Summary of history : Last seen in 07/16/2018 as a hospital follow-up.  I was consulted to see her on 07/01/2018 when she presented with anemia.  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  Her prior records suggest that she has had microcytic anemia for over a year.  On admission hemoglobin 6.5 g with an MCV of 68.9.  She has had a normal hemoglobin in 2013 with a value of 14.3 g.    She had  a ferritin of 2, B12 100.  She underwent an upper endoscopy and colonoscopy on 07/02/2018 by Dr. Maximino Greenlandahiliani for evaluation of iron deficiency anemia.  EGD showed no gross abnormality except a large hiatal hernia.  Colonoscopy the prep was poor but no lesion contributing to the anemia was noted.  Capsule study performed at [facility] showed proximal small bowel AVM 1 minute after entering the duodenum  07/17/2018 push enteroscopy showed no abnormalities to be 150 cm mark well beyond the point at which an AVM was seen on capsule study of the small bowel.  Interval history 08/04/2018-09/24/2018  08/23/2018 underwent robotic assisted laparoscopic Nissen fundoplication with repair of paraesophageal hernia.  08/24/2018 hemoglobin 14 g.  Doing well after surgery.  Some issues with heartburn at times.  She is not on any PPI.  Current Outpatient Medications  Medication Sig Dispense Refill  . rivaroxaban (XARELTO) 20 MG TABS tablet Take 20 mg by mouth daily at 8 pm.     No current facility-administered medications for this visit.     Allergies as of 09/24/2018 - Review Complete 09/05/2018  Allergen Reaction Noted  . Food Other (See Comments) 07/06/2018  . Tape Itching, Dermatitis, Rash, and Other (See  Comments) 07/01/2018  . Povidone iodine  07/16/2018  . Codeine Other (See Comments) 10/26/2016  . Shellfish allergy Other (See Comments) 10/26/2016    ROS:  General: Negative for anorexia, weight loss, fever, chills, fatigue, weakness. ENT: Negative for hoarseness, difficulty swallowing , nasal congestion. CV: Negative for chest pain, angina, palpitations, dyspnea on exertion, peripheral edema.  Respiratory: Negative for dyspnea at rest, dyspnea on exertion, cough, sputum, wheezing.  GI: See history of present illness. GU:  Negative for dysuria, hematuria, urinary incontinence, urinary frequency, nocturnal urination.  Endo: Negative for unusual weight change.    Physical Examination:   There were no vitals taken for this visit.  General: Well-nourished, well-developed in no acute distress.  Eyes: No icterus. Conjunctivae pink. Mouth: Oropharyngeal mucosa moist and pink , no lesions erythema or exudate. Lungs: Clear to auscultation bilaterally. Non-labored. Heart: Regular rate and rhythm, no murmurs rubs or gallops.  Abdomen: Bowel sounds are normal, nontender, nondistended, no hepatosplenomegaly or masses, no abdominal bruits or hernia , no rebound or guarding.   Extremities: No lower extremity edema. No clubbing or deformities. Neuro: Alert and oriented x 3.  Grossly intact. Skin: Warm and dry, no jaundice.   Psych: Alert and cooperative, normal mood and affect.   Imaging Studies: No results found.  Assessment and Plan:   Jasmine [name] is a 58 y.o. y/o female  with iron/B12 deficiency anemia. EGD showed a large hiatal hernia which at times can cause  iron deficiency. Colonoscopy was a poor prep but no lesions seen. 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   Plan :   1. Screening colonoscopy in 6 months due to poor prep in November 2020. 2.        Follow-up in 3 months with repeat CBC and iron studies.  Continue B12 supplementation along with iron which she is  presently getting at the cancer center. 3.  Commence on Prilosec 20 mg once a day.     Dr Jonathon Bellows  MD,MRCP [facility]) Follow up in 4 months.

## 2018-10-08 ENCOUNTER — Other Ambulatory Visit: Payer: Self-pay

## 2018-10-09 ENCOUNTER — Inpatient Hospital Stay: Payer: Self-pay | Attending: Oncology

## 2018-10-09 ENCOUNTER — Inpatient Hospital Stay (HOSPITAL_BASED_OUTPATIENT_CLINIC_OR_DEPARTMENT_OTHER): Payer: Self-pay | Admitting: Oncology

## 2018-10-09 ENCOUNTER — Inpatient Hospital Stay: Payer: Self-pay

## 2018-10-09 ENCOUNTER — Other Ambulatory Visit: Payer: Self-pay

## 2018-10-09 ENCOUNTER — Encounter: Payer: Self-pay | Admitting: Oncology

## 2018-10-09 VITALS — BP 160/99 | HR 65 | Temp 97.2°F | Ht 64.0 in | Wt 180.0 lb

## 2018-10-09 DIAGNOSIS — Z86718 Personal history of other venous thrombosis and embolism: Secondary | ICD-10-CM

## 2018-10-09 DIAGNOSIS — D509 Iron deficiency anemia, unspecified: Secondary | ICD-10-CM

## 2018-10-09 DIAGNOSIS — E538 Deficiency of other specified B group vitamins: Secondary | ICD-10-CM | POA: Insufficient documentation

## 2018-10-09 LAB — CBC WITH DIFFERENTIAL/PLATELET
Abs Immature Granulocytes: 0.03 10*3/uL (ref 0.00–0.07)
Basophils Absolute: 0 10*3/uL (ref 0.0–0.1)
Basophils Relative: 1 %
Eosinophils Absolute: 0.3 10*3/uL (ref 0.0–0.5)
Eosinophils Relative: 5 %
HCT: 48.5 % — ABNORMAL HIGH (ref 36.0–46.0)
Hemoglobin: 15.7 g/dL — ABNORMAL HIGH (ref 12.0–15.0)
Immature Granulocytes: 0 %
Lymphocytes Relative: 18 %
Lymphs Abs: 1.3 10*3/uL (ref 0.7–4.0)
MCH: 30.5 pg (ref 26.0–34.0)
MCHC: 32.4 g/dL (ref 30.0–36.0)
MCV: 94.2 fL (ref 80.0–100.0)
Monocytes Absolute: 0.3 10*3/uL (ref 0.1–1.0)
Monocytes Relative: 4 %
Neutro Abs: 5.1 10*3/uL (ref 1.7–7.7)
Neutrophils Relative %: 72 %
Platelets: 191 10*3/uL (ref 150–400)
RBC: 5.15 MIL/uL — ABNORMAL HIGH (ref 3.87–5.11)
RDW: 17.8 % — ABNORMAL HIGH (ref 11.5–15.5)
WBC: 7.1 10*3/uL (ref 4.0–10.5)
nRBC: 0 % (ref 0.0–0.2)

## 2018-10-09 LAB — IRON AND TIBC
Iron: 83 ug/dL (ref 28–170)
Saturation Ratios: 20 % (ref 10.4–31.8)
TIBC: 417 ug/dL (ref 250–450)
UIBC: 335 ug/dL

## 2018-10-09 LAB — FERRITIN: Ferritin: 29 ng/mL (ref 11–307)

## 2018-10-09 MED ORDER — CYANOCOBALAMIN 1000 MCG/ML IJ SOLN
1000.0000 ug | INTRAMUSCULAR | Status: DC
  Administered 2018-10-09: 1000 ug via INTRAMUSCULAR
  Filled 2018-10-09: qty 1

## 2018-10-10 NOTE — Progress Notes (Signed)
Hematology/Oncology Consult note [facility]  Telephone:([phone] Fax:([phone]  Patient Care Team: Patient, No Pcp Per as PCP - General (General Practice)   Name of the patient: Jasmine [name]  [id number]  17-Jul-1960   Date of visit: 10/10/18  Diagnosis- 1. Recurrent cerebral venous sinus thrombosis 2. Iron deficiency anemia  Chief complaint/ Reason for visit- routine f/u of iron deficiency anemia  Heme/Onc history: patient is a 58 year old Caucasian female with a history of Extensive dural venous sinus thrombosis back in 2013.  She was started on Coumadin at that time.  She had hypercoagulable work-up done including prothrombin gene mutation, protein C, protein S activity, factor V activity and Antithrombin III levels which were unremarkable.  She also had work-up for antiphospholipid antibody syndrome which was negative.  No apparent cause for her dural venous sinus thrombosis was found at that time.  She continues to follow-up with neurology and was seen by them in 2016.  At that time her Coumadin was stopped as repeat MRI showed significant improvement in these findings and patient has not had any current strokes.  She was switched to low-dose aspirin.  She then presented with a second episode of dural venous sinus thrombosis in 2018.  At that time she was started on rivaraoxaban and she continues to be on that.  CBC in September 2018 showed a white count of 14.9, hemoglobin interval 9/30.4 with an MCV of 71.2 and a platelet count of 326 which was lower as compared to her baseline hemoglobin of 14 in 2013.  She now presents to the hospital with right upper quadrant abdominal pain and she was found to have New onset anemia with a hemoglobin of 6.5/25.3 with an MCV of 68.9 and was hence admitted.  B12 levels were low at 100.  Ferritin levels were low at 2 and iron studies showed elevated TIBC of 580 with a low iron saturation of 7%.  Folate levels were normal.   egd colonoscopy and capsule study were unremarkable except for large hiatal hernia which was likely the cause of her iron deficiency anemia. She underwent   Interval history- she reports feeling well. She has been having some indigestion since her surgery. She is back to taking xarelto without any bleeding issues  ECOG PS- 0 Pain scale- 0   Review of systems- Review of Systems  Constitutional: Negative for chills, fever, malaise/fatigue and weight loss.  HENT: Negative for congestion, ear discharge and nosebleeds.   Eyes: Negative for blurred vision.  Respiratory: Negative for cough, hemoptysis, sputum production, shortness of breath and wheezing.   Cardiovascular: Negative for chest pain, palpitations, orthopnea and claudication.  Gastrointestinal: Negative for abdominal pain, blood in stool, constipation, diarrhea, heartburn, melena, nausea and vomiting.  Genitourinary: Negative for dysuria, flank pain, frequency, hematuria and urgency.  Musculoskeletal: Negative for back pain, joint pain and myalgias.  Skin: Negative for rash.  Neurological: Negative for dizziness, tingling, focal weakness, seizures, weakness and headaches.  Endo/Heme/Allergies: Does not bruise/bleed easily.  Psychiatric/Behavioral: Negative for depression and suicidal ideas. The patient does not have insomnia.       Allergies  Allergen Reactions  . Food Other (See Comments)    Pumpkins/squash/zucchini (gourds foods) swelling of the face  . Tape Itching, Dermatitis, Rash and Other (See Comments)    Transpore tape  . Povidone Iodine   . Codeine Other (See Comments)    Crawling skin  . Shellfish Allergy Other (See Comments)    Crawling skin/stomach cramps.  Past Medical History:  Diagnosis Date  . Anemia   . Arthritis    LEFT SHOULDER  . H/O cerebral venous sinus thrombosis 2013  . History of hiatal hernia   . History of kidney stones      Past Surgical History:  Procedure Laterality Date  .  COLONOSCOPY WITH PROPOFOL N/A 07/02/2018   Procedure: COLONOSCOPY WITH PROPOFOL;  Surgeon: Pasty Spillers, MD;  Location: [facility] ENDOSCOPY;  Service: Endoscopy;  Laterality: N/A;  . ENTEROSCOPY N/A 07/17/2018   Procedure: ENTEROSCOPY;  Surgeon: Wyline Mood, MD;  Location: [facility] ENDOSCOPY;  Service: Gastroenterology;  Laterality: N/A;  . ESOPHAGOGASTRODUODENOSCOPY N/A 2001   approximately- for Reflux  . ESOPHAGOGASTRODUODENOSCOPY (EGD) WITH PROPOFOL N/A 07/02/2018   Procedure: ESOPHAGOGASTRODUODENOSCOPY (EGD) WITH PROPOFOL;  Surgeon: Pasty Spillers, MD;  Location: [facility] ENDOSCOPY;  Service: Endoscopy;  Laterality: N/A;  . ESOPHAGOGASTRODUODENOSCOPY (EGD) WITH PROPOFOL N/A 07/11/2018   Procedure: ESOPHAGOGASTRODUODENOSCOPY (EGD) WITH PROPOFOL;  Surgeon: Sherrilyn Rist, MD;  Location[facility]COPY;  Service: Gastroenterology;  Laterality: N/A;  PT TO HAVE CAPSULE PLACEMENT  . GIVENS CAPSULE STUDY N/A 07/11/2018   Procedure: GIVENS CAPSULE STUDY;  Surgeon: Sherrilyn Rist, MD;  Location: [facility] ENDOSCOPY;  Service: Gastroenterology;  Laterality: N/A;  . HIATAL HERNIA REPAIR  08/23/2018   XI ROBOTIC ASSISTED HIATAL HERNIA REPAIR  WITH FUNDOPLICATION    Social History   Socioeconomic History  . Marital status: Single    Spouse name: Not on file  . Number of children: Not on file  . Years of education: Not on file  . Highest education level: Not on file  Occupational History  . Not on file  Social Needs  . Financial resource strain: Not on file  . Food insecurity    Worry: Not on file    Inability: Not on file  . Transportation needs    Medical: Not on file    Non-medical: Not on file  Tobacco Use  . Smoking status: Never Smoker  . Smokeless tobacco: Never Used  Substance and Sexual Activity  . Alcohol use: Yes    Comment: occasional  . Drug use: Yes    Types: Marijuana  . Sexual activity: Not on file  Lifestyle  . Physical activity    Days per week: Not on file    Minutes  per session: Not on file  . Stress: Not on file  Relationships  . Social Musician on phone: Not on file    Gets together: Not on file    Attends religious service: Not on file    Active member of club or organization: Not on file    Attends meetings of clubs or organizations: Not on file    Relationship status: Not on file  . Intimate partner violence    Fear of current or ex partner: Not on file    Emotionally abused: Not on file    Physically abused: Not on file    Forced sexual activity: Not on file  Other Topics Concern  . Not on file  Social History Narrative   Lives at home with her parents.  Independent at baseline.    Family History  Problem Relation Age of Onset  . Breast cancer Mother   . Bone cancer Mother      Current Outpatient Medications:  .  rivaroxaban (XARELTO) 20 MG TABS tablet, Take 20 mg by mouth daily at 8 pm., Disp: , Rfl:  .  omeprazole (PRILOSEC) 20 MG  capsule, Take 1 capsule (20 mg total) by mouth daily. (Patient not taking: Reported on 10/09/2018), Disp: 90 capsule, Rfl: 3 No current facility-administered medications for this visit.   Facility-Administered Medications Ordered in Other Visits:  .  cyanocobalamin ((VITAMIN B-12)) injection 1,000 mcg, 1,000 mcg, Intramuscular, Q30 days, Creig Hinesao, Adekunle Rohrbach C, MD, 1,000 mcg at 10/09/18 1116  Physical exam:  Vitals:   10/09/18 1047  BP: (!) 160/99  Pulse: 65  Temp: (!) 97.2 F (36.2 C)  TempSrc: Tympanic  Weight: 180 lb (81.6 kg)  Height: 5\' 4"  (1.626 m)   Physical Exam Constitutional:      General: She is not in acute distress. HENT:     Head: Normocephalic and atraumatic.  Eyes:     Pupils: Pupils are equal, round, and reactive to light.  Neck:     Musculoskeletal: Normal range of motion.  Cardiovascular:     Rate and Rhythm: Normal rate and regular rhythm.     Heart sounds: Normal heart sounds.  Pulmonary:     Effort: Pulmonary effort is normal.     Breath sounds: Normal breath  sounds.  Abdominal:     General: Bowel sounds are normal.     Palpations: Abdomen is soft.  Skin:    General: Skin is warm and dry.  Neurological:     Mental Status: She is alert and oriented to person, place, and time.      CMP Latest Ref Rng & Units 08/24/2018  Glucose 70 - 99 mg/dL 161(W125(H)  BUN 6 - 20 mg/dL 9  Creatinine 9.600.44 - 4.541.00 mg/dL 0.980.61  Sodium 119135 - 147145 mmol/L 139  Potassium 3.5 - 5.1 mmol/L 4.4  Chloride 98 - 111 mmol/L 104  CO2 22 - 32 mmol/L 25  Calcium 8.9 - 10.3 mg/dL 9.3  Total Protein 6.5 - 8.1 g/dL -  Total Bilirubin 0.3 - 1.2 mg/dL -  Alkaline Phos 38 - 829126 U/L -  AST 15 - 41 U/L -  ALT 0 - 44 U/L -   CBC Latest Ref Rng & Units 10/09/2018  WBC 4.0 - 10.5 K/uL 7.1  Hemoglobin 12.0 - 15.0 g/dL 15.7(H)  Hematocrit 36.0 - 46.0 % 48.5(H)  Platelets 150 - 400 K/uL 191     Assessment and plan- Patient is a 58 y.o. female here for following issues:  1. Iron deficiency anemia: she is no longer anemic. Iron studies are normal. No need for iv iron at this time. Cbc ferritin and iron studies in 3 and 6 months and I will see her back in 6 months  2. H/o cerebral venous thrombosis: continue xarelto indefinitely   Visit Diagnosis 1. History of cerebral venous sinus thrombosis   2. Iron deficiency anemia, unspecified iron deficiency anemia type      Dr. Owens SharkArchana Reshunda Strider, MD, MPH [facility] at [facility] [phone] 10/10/2018 2:04 PM

## 2018-10-23 ENCOUNTER — Encounter: Payer: Self-pay | Admitting: Oncology

## 2018-11-08 ENCOUNTER — Other Ambulatory Visit: Payer: Self-pay

## 2018-11-09 ENCOUNTER — Inpatient Hospital Stay: Payer: Self-pay | Attending: Oncology

## 2018-11-09 ENCOUNTER — Other Ambulatory Visit: Payer: Self-pay

## 2018-11-09 DIAGNOSIS — D509 Iron deficiency anemia, unspecified: Secondary | ICD-10-CM

## 2018-11-09 DIAGNOSIS — E538 Deficiency of other specified B group vitamins: Secondary | ICD-10-CM | POA: Insufficient documentation

## 2018-11-09 MED ORDER — CYANOCOBALAMIN 1000 MCG/ML IJ SOLN
1000.0000 ug | INTRAMUSCULAR | Status: DC
  Administered 2018-11-09: 1000 ug via INTRAMUSCULAR

## 2018-11-12 ENCOUNTER — Encounter: Payer: Self-pay | Admitting: Oncology

## 2018-12-10 ENCOUNTER — Ambulatory Visit: Payer: Self-pay

## 2018-12-12 ENCOUNTER — Inpatient Hospital Stay: Payer: Self-pay | Attending: Oncology

## 2018-12-12 ENCOUNTER — Other Ambulatory Visit: Payer: Self-pay

## 2018-12-12 DIAGNOSIS — D509 Iron deficiency anemia, unspecified: Secondary | ICD-10-CM

## 2018-12-12 DIAGNOSIS — E538 Deficiency of other specified B group vitamins: Secondary | ICD-10-CM | POA: Insufficient documentation

## 2018-12-12 MED ORDER — CYANOCOBALAMIN 1000 MCG/ML IJ SOLN
1000.0000 ug | INTRAMUSCULAR | Status: DC
  Administered 2018-12-12: 14:00:00 1000 ug via INTRAMUSCULAR
  Filled 2018-12-12: qty 1

## 2018-12-18 ENCOUNTER — Encounter: Payer: Self-pay | Admitting: Gerontology

## 2018-12-18 ENCOUNTER — Ambulatory Visit: Payer: Self-pay | Admitting: Gerontology

## 2018-12-18 ENCOUNTER — Other Ambulatory Visit: Payer: Self-pay

## 2018-12-18 VITALS — BP 150/90 | HR 71 | Temp 97.5°F | Ht 64.0 in | Wt 177.0 lb

## 2018-12-18 DIAGNOSIS — R030 Elevated blood-pressure reading, without diagnosis of hypertension: Secondary | ICD-10-CM | POA: Insufficient documentation

## 2018-12-18 DIAGNOSIS — Z7689 Persons encountering health services in other specified circumstances: Secondary | ICD-10-CM | POA: Insufficient documentation

## 2018-12-18 DIAGNOSIS — Z86718 Personal history of other venous thrombosis and embolism: Secondary | ICD-10-CM | POA: Insufficient documentation

## 2018-12-18 DIAGNOSIS — G8929 Other chronic pain: Secondary | ICD-10-CM | POA: Insufficient documentation

## 2018-12-18 DIAGNOSIS — D509 Iron deficiency anemia, unspecified: Secondary | ICD-10-CM

## 2018-12-18 DIAGNOSIS — M79671 Pain in right foot: Secondary | ICD-10-CM

## 2018-12-18 NOTE — Patient Instructions (Signed)

## 2018-12-18 NOTE — Progress Notes (Signed)
Patient ID: Jasmine [name], female   DOB: [birth year], 58 y.o.   MRN: [id number]  Chief Complaint  Patient presents with  . Establish Care    HPI Jasmine [name] is a 58 y.o. female who presents to establish care and evaluation of her chronic conditions. She has a history of cerebral venous sinus thrombosis and has been on 20 mg Rivaroxaban daily for 7 years. She states that she bruised her left hip after she [place] the beach over the [place]. She states that the bruised site is improving, she denies hematuria, hematochezia and dark tarry stool.  She has robotic hiatal hernia repair and nissen fundoplication procedure on 08/23/2018. She also has a history of Iron/B12 deficiency anemia , she received iron infusion and Vitamin B 12 injections. She was seen by Dr Owens Shark C on 10/09/2018 and her iron studies were normal.  Her blood pressure was elevated during visit and she states that she has white coat syndrome. She admits to checking her blood pressure at home and her SBP is between 120-130 and DBP's were less than 90. Her blood pressure was rechecked during visit and it was 150/90. She denies chest pain, palpitation, light headedness and vision changes. Currently she states that her right foot is deformed, and she's being experiencing pain to soles of her feet and ankle after standing for 6 hours at work. She works at [employer]. She states that she has not taking any medication for the pain to her feet, and wearing Sketchers sneakers and using orthotic inserts doesn't relieve pain. She denies swelling, erythema and claudication. She states that staying off her feet offers relief. She declines Influenza vaccine and requests Mammogram and Pap smear screening. She states that she's doing well and offers no further complaint.   Past Medical History:  Diagnosis Date  . Anemia   . Arthritis    LEFT SHOULDER  . H/O cerebral venous sinus thrombosis 2013  . History of hiatal hernia   . History of  kidney stones     Past Surgical History:  Procedure Laterality Date  . COLONOSCOPY WITH PROPOFOL N/A 07/02/2018   Procedure: COLONOSCOPY WITH PROPOFOL;  Surgeon: Pasty Spillers, MD;  Location: [facility] ENDOSCOPY;  Service: Endoscopy;  Laterality: N/A;  . ENTEROSCOPY N/A 07/17/2018   Procedure: ENTEROSCOPY;  Surgeon: Wyline Mood, MD;  Location: [facility] ENDOSCOPY;  Service: Gastroenterology;  Laterality: N/A;  . ESOPHAGOGASTRODUODENOSCOPY N/A 2001   approximately- for Reflux  . ESOPHAGOGASTRODUODENOSCOPY (EGD) WITH PROPOFOL N/A 07/02/2018   Procedure: ESOPHAGOGASTRODUODENOSCOPY (EGD) WITH PROPOFOL;  Surgeon: Pasty Spillers, MD;  Location: [facility] ENDOSCOPY;  Service: Endoscopy;  Laterality: N/A;  . ESOPHAGOGASTRODUODENOSCOPY (EGD) WITH PROPOFOL N/A 07/11/2018   Procedure: ESOPHAGOGASTRODUODENOSCOPY (EGD) WITH PROPOFOL;  Surgeon: Sherrilyn Rist, MD;  Location: [facility] ENDOSCOPY;  Service: Gastroenterology;  Laterality: N/A;  PT TO HAVE CAPSULE PLACEMENT  . GIVENS CAPSULE STUDY N/A 07/11/2018   Procedure: GIVENS CAPSULE STUDY;  Surgeon: Sherrilyn Rist, MD;  Location: [facility] ENDOSCOPY;  Service: Gastroenterology;  Laterality: N/A;  . HIATAL HERNIA REPAIR  08/23/2018   XI ROBOTIC ASSISTED HIATAL HERNIA REPAIR  WITH FUNDOPLICATION    Family History  Problem Relation Age of Onset  . Breast cancer Mother   . Bone cancer Mother     Social History Social History   Tobacco Use  . Smoking status: Never Smoker  . Smokeless tobacco: Never Used  Substance Use Topics  . Alcohol use: Yes    Comment: occasional  .  Drug use: Yes    Types: Marijuana    Allergies  Allergen Reactions  . Food Other (See Comments)    Pumpkins/squash/zucchini (gourds foods) swelling of the face  . Tape Itching, Dermatitis, Rash and Other (See Comments)    Transpore tape  . Povidone Iodine   . Codeine Other (See Comments)    Crawling skin  . Shellfish Allergy Other (See Comments)    Crawling skin/stomach cramps.     Current Outpatient Medications  Medication Sig Dispense Refill  . rivaroxaban (XARELTO) 20 MG TABS tablet Take 20 mg by mouth daily at 8 pm.     No current facility-administered medications for this visit.    Facility-Administered Medications Ordered in Other Visits  Medication Dose Route Frequency Provider Last Rate Last Dose  . cyanocobalamin ((VITAMIN B-12)) injection 1,000 mcg  1,000 mcg Intramuscular Q30 days Creig Hinesao, Archana C, MD   1,000 mcg at 10/09/18 1116    Review of Systems Review of Systems  Constitutional: Negative.   HENT: Negative.   Respiratory: Negative.   Cardiovascular: Negative.   Gastrointestinal: Negative.   Endocrine: Negative.   Genitourinary: Negative.   Musculoskeletal: Positive for arthralgias (chronic pain to feet and ankle after standing at work for 6 hours).  Skin: Negative.   Neurological: Negative.   Hematological: Negative.   Psychiatric/Behavioral: Negative.     Blood pressure (!) 150/90, pulse 71, temperature (!) 97.5 F (36.4 C), height 5\' 4"  (1.626 m), weight 177 lb (80.3 kg), SpO2 100 %.  Physical Exam Physical Exam Constitutional:      Appearance: Normal appearance.  HENT:     Head: Normocephalic and atraumatic.     Nose: Nose normal.     Mouth/Throat:     Mouth: Mucous membranes are moist.  Eyes:     Extraocular Movements: Extraocular movements intact.     Pupils: Pupils are equal, round, and reactive to light.  Neck:     Musculoskeletal: Normal range of motion.  Cardiovascular:     Rate and Rhythm: Normal rate and regular rhythm.     Pulses: Normal pulses.     Heart sounds: Normal heart sounds.  Pulmonary:     Effort: Pulmonary effort is normal.     Breath sounds: Normal breath sounds.  Abdominal:     General: Abdomen is flat. Bowel sounds are normal.     Palpations: Abdomen is soft.  Genitourinary:    Comments: Deferred per patient. Musculoskeletal: Normal range of motion.  Skin:    General: Skin is warm and dry.   Neurological:     General: No focal deficit present.     Mental Status: She is alert and oriented to person, place, and time. Mental status is at baseline.  Psychiatric:        Mood and Affect: Mood normal.        Behavior: Behavior normal.        Thought Content: Thought content normal.        Judgment: Judgment normal.     Data Reviewed Lab and past medical and surgical histories was reviewed.  Assessment and Plan  1. Iron deficiency anemia, unspecified iron deficiency anemia type - Her Iron studies done on 10/09/2018 were normal and she will follow up with Dr Cordelia Penao A.C on 04/12/2019.  2. History of cerebral venous sinus thrombosis - She will continue on Rivaroxaban 20 mg, and was advised to go to the ED for active bleeding.  3. Encounter to establish care - Routine labs will  be checked. - Ambulatory referral to Hematology / Oncology for Mammogram and Pap smear screening. - Lipid panel; Future - HgB A1c; Future - Thyroid Panel With TSH; Future - Urinalysis; Future  4. Chronic pain of both feet - No feet deformity was observed, pulses present and skin is warm to touch. She was advised to wear support hose and proper fitting shoe . - She was encouraged to take frequent breaks and notify clinic for worsening symptoms.  5. Elevated blood pressure reading - Her blood pressure was 150/90 when rechecked and her goal should be <140/90. She was advised to check, record and bring blood pressure log to her follow up appointment. She was encouraged to continue on DASH diet, and exercise as tolerated.   Follow up: In 5 weeks ( 01/22/2019) of if symptom worsens or fail to improve.  Terissa Haffey E Trevionne Advani 12/18/2018, 9:00 PM

## 2019-01-09 ENCOUNTER — Inpatient Hospital Stay: Payer: Self-pay

## 2019-01-09 ENCOUNTER — Inpatient Hospital Stay: Payer: Self-pay | Attending: Oncology

## 2019-01-09 ENCOUNTER — Other Ambulatory Visit: Payer: Self-pay

## 2019-01-09 DIAGNOSIS — D509 Iron deficiency anemia, unspecified: Secondary | ICD-10-CM

## 2019-01-09 DIAGNOSIS — E538 Deficiency of other specified B group vitamins: Secondary | ICD-10-CM | POA: Insufficient documentation

## 2019-01-09 LAB — CBC WITH DIFFERENTIAL/PLATELET
Abs Immature Granulocytes: 0.02 10*3/uL (ref 0.00–0.07)
Basophils Absolute: 0.1 10*3/uL (ref 0.0–0.1)
Basophils Relative: 1 %
Eosinophils Absolute: 0.4 10*3/uL (ref 0.0–0.5)
Eosinophils Relative: 4 %
HCT: 42.6 % (ref 36.0–46.0)
Hemoglobin: 13.7 g/dL (ref 12.0–15.0)
Immature Granulocytes: 0 %
Lymphocytes Relative: 15 %
Lymphs Abs: 1.3 10*3/uL (ref 0.7–4.0)
MCH: 31.8 pg (ref 26.0–34.0)
MCHC: 32.2 g/dL (ref 30.0–36.0)
MCV: 98.8 fL (ref 80.0–100.0)
Monocytes Absolute: 0.3 10*3/uL (ref 0.1–1.0)
Monocytes Relative: 4 %
Neutro Abs: 6.8 10*3/uL (ref 1.7–7.7)
Neutrophils Relative %: 76 %
Platelets: 247 10*3/uL (ref 150–400)
RBC: 4.31 MIL/uL (ref 3.87–5.11)
RDW: 13.1 % (ref 11.5–15.5)
WBC: 8.9 10*3/uL (ref 4.0–10.5)
nRBC: 0 % (ref 0.0–0.2)

## 2019-01-09 LAB — IRON AND TIBC
Iron: 99 ug/dL (ref 28–170)
Saturation Ratios: 25 % (ref 10.4–31.8)
TIBC: 396 ug/dL (ref 250–450)
UIBC: 297 ug/dL

## 2019-01-09 LAB — FERRITIN: Ferritin: 30 ng/mL (ref 11–307)

## 2019-01-09 MED ORDER — CYANOCOBALAMIN 1000 MCG/ML IJ SOLN
1000.0000 ug | INTRAMUSCULAR | Status: DC
  Administered 2019-01-09: 1000 ug via INTRAMUSCULAR
  Filled 2019-01-09: qty 1

## 2019-01-22 ENCOUNTER — Ambulatory Visit: Payer: Self-pay | Admitting: Gerontology

## 2019-01-30 ENCOUNTER — Other Ambulatory Visit: Payer: Self-pay

## 2019-02-11 ENCOUNTER — Ambulatory Visit: Payer: Self-pay | Admitting: Gerontology

## 2019-02-19 ENCOUNTER — Encounter: Payer: Self-pay | Admitting: Oncology

## 2019-02-20 ENCOUNTER — Other Ambulatory Visit: Payer: Self-pay

## 2019-02-20 DIAGNOSIS — Z7689 Persons encountering health services in other specified circumstances: Secondary | ICD-10-CM

## 2019-02-20 DIAGNOSIS — D509 Iron deficiency anemia, unspecified: Secondary | ICD-10-CM

## 2019-02-20 NOTE — Progress Notes (Unsigned)
[facility] 

## 2019-02-21 LAB — URINALYSIS
Bilirubin, UA: NEGATIVE
Glucose, UA: NEGATIVE
Ketones, UA: NEGATIVE
Leukocytes,UA: NEGATIVE
Nitrite, UA: NEGATIVE
Protein,UA: NEGATIVE
Specific Gravity, UA: 1.018 (ref 1.005–1.030)
Urobilinogen, Ur: 0.2 mg/dL (ref 0.2–1.0)
pH, UA: 8 — ABNORMAL HIGH (ref 5.0–7.5)

## 2019-02-21 LAB — CBC WITH DIFFERENTIAL/PLATELET
Basophils Absolute: 0.1 10*3/uL (ref 0.0–0.2)
Basos: 1 %
EOS (ABSOLUTE): 0.3 10*3/uL (ref 0.0–0.4)
Eos: 4 %
Hematocrit: 46.3 % (ref 34.0–46.6)
Hemoglobin: 15.8 g/dL (ref 11.1–15.9)
Immature Grans (Abs): 0 10*3/uL (ref 0.0–0.1)
Immature Granulocytes: 1 %
Lymphocytes Absolute: 1.6 10*3/uL (ref 0.7–3.1)
Lymphs: 24 %
MCH: 31.5 pg (ref 26.6–33.0)
MCHC: 34.1 g/dL (ref 31.5–35.7)
MCV: 92 fL (ref 79–97)
Monocytes Absolute: 0.3 10*3/uL (ref 0.1–0.9)
Monocytes: 5 %
Neutrophils Absolute: 4.4 10*3/uL (ref 1.4–7.0)
Neutrophils: 65 %
Platelets: 249 10*3/uL (ref 150–450)
RBC: 5.01 x10E6/uL (ref 3.77–5.28)
RDW: 12.4 % (ref 11.7–15.4)
WBC: 6.6 10*3/uL (ref 3.4–10.8)

## 2019-02-21 LAB — THYROID PANEL WITH TSH
Free Thyroxine Index: 1.8 (ref 1.2–4.9)
T3 Uptake Ratio: 29 % (ref 24–39)
T4, Total: 6.2 ug/dL (ref 4.5–12.0)
TSH: 2.3 u[IU]/mL (ref 0.450–4.500)

## 2019-02-21 LAB — LIPID PANEL
Chol/HDL Ratio: 3.5 ratio (ref 0.0–4.4)
Cholesterol, Total: 241 mg/dL — ABNORMAL HIGH (ref 100–199)
HDL: 69 mg/dL (ref >39)
LDL Chol Calc (NIH): 160 mg/dL — ABNORMAL HIGH (ref 0–99)
Triglycerides: 69 mg/dL (ref 0–149)
VLDL Cholesterol Cal: 12 mg/dL (ref 5–40)

## 2019-02-21 LAB — HEMOGLOBIN A1C
Est. average glucose Bld gHb Est-mCnc: 100 mg/dL
Hgb A1c MFr Bld: 5.1 % (ref 4.8–5.6)

## 2019-02-21 LAB — IRON: Iron: 96 ug/dL (ref 27–159)

## 2019-02-26 ENCOUNTER — Other Ambulatory Visit: Payer: Self-pay

## 2019-02-26 ENCOUNTER — Ambulatory Visit: Payer: Self-pay | Admitting: Gerontology

## 2019-02-26 VITALS — BP 140/75

## 2019-02-26 DIAGNOSIS — E782 Mixed hyperlipidemia: Secondary | ICD-10-CM | POA: Insufficient documentation

## 2019-02-26 DIAGNOSIS — E785 Hyperlipidemia, unspecified: Secondary | ICD-10-CM

## 2019-02-26 DIAGNOSIS — R030 Elevated blood-pressure reading, without diagnosis of hypertension: Secondary | ICD-10-CM

## 2019-02-26 NOTE — Progress Notes (Signed)
Established Patient Office Visit  Subjective:  Patient ID: Jasmine [name], female    DOB: 1960-11-15  Age: 59 y.o. MRN: [id number]  CC: No chief complaint on file. Patient consents to telephone visit and 2 patient identifiers was used to identify patient.  HPI Corin Formisano presents for follow up of elevated blood pressure, and lab review.  She states that she checks her blood pressure at home and is usually less than 140/80.  She checked it during visit and it was 140/75.  She continues to take 20 mg Rivaroxaban for history of venous sinus thrombosis, She admits to experiencing occasional mild bruises to her arms, but she denies hematuria, hematochezia and active bleeding. Her Lipid panel done on 02/19/2018, total cholesterol was 241 mg/dl and LDL was 160 mg/dl.  She denies chest pain, palpitation, lightheadedness, fever and chills.  Overall she states that she is doing well and offers no further complaint.  Past Medical History:  Diagnosis Date  . Anemia   . Arthritis    LEFT SHOULDER  . H/O cerebral venous sinus thrombosis 2013  . History of hiatal hernia   . History of kidney stones     Past Surgical History:  Procedure Laterality Date  . COLONOSCOPY WITH PROPOFOL N/A 07/02/2018   Procedure: COLONOSCOPY WITH PROPOFOL;  Surgeon: Virgel Manifold, MD;  Location: [facility] ENDOSCOPY;  Service: Endoscopy;  Laterality: N/A;  . ENTEROSCOPY N/A 07/17/2018   Procedure: ENTEROSCOPY;  Surgeon: Jonathon Bellows, MD;  Location: [facility] ENDOSCOPY;  Service: Gastroenterology;  Laterality: N/A;  . ESOPHAGOGASTRODUODENOSCOPY N/A 2001   approximately- for Reflux  . ESOPHAGOGASTRODUODENOSCOPY (EGD) WITH PROPOFOL N/A 07/02/2018   Procedure: ESOPHAGOGASTRODUODENOSCOPY (EGD) WITH PROPOFOL;  Surgeon: Virgel Manifold, MD;  Location: [facility] ENDOSCOPY;  Service: Endoscopy;  Laterality: N/A;  . ESOPHAGOGASTRODUODENOSCOPY (EGD) WITH PROPOFOL N/A 07/11/2018   Procedure: ESOPHAGOGASTRODUODENOSCOPY (EGD) WITH PROPOFOL;   Surgeon: Doran Stabler, MD;  Location: [place];  Service: Gastroenterology;  Laterality: N/A;  PT TO HAVE CAPSULE PLACEMENT  . GIVENS CAPSULE STUDY N/A 07/11/2018   Procedure: GIVENS CAPSULE STUDY;  Surgeon: Doran Stabler, MD;  Location: [place];  Service: Gastroenterology;  Laterality: N/A;  . HIATAL HERNIA REPAIR  08/23/2018   XI ROBOTIC ASSISTED HIATAL HERNIA REPAIR  WITH FUNDOPLICATION    Family History  Problem Relation Age of Onset  . Breast cancer Mother   . Bone cancer Mother     Social History   Socioeconomic History  . Marital status: Single    Spouse name: Not on file  . Number of children: Not on file  . Years of education: Not on file  . Highest education level: Not on file  Occupational History  . Not on file  Tobacco Use  . Smoking status: Never Smoker  . Smokeless tobacco: Never Used  Substance and Sexual Activity  . Alcohol use: Yes    Comment: occasional  . Drug use: Yes    Types: Marijuana  . Sexual activity: Not on file  Other Topics Concern  . Not on file  Social History Narrative   Lives at home with her parents.  Independent at baseline.   Social Determinants of Health   Financial Resource Strain:   . Difficulty of Paying Living Expenses: Not on file  Food Insecurity:   . Worried About Charity fundraiser in the Last Year: Not on file  . Ran Out of Food in the Last Year: Not on file  Transportation Needs:   .  Lack of Transportation (Medical): Not on file  . Lack of Transportation (Non-Medical): Not on file  Physical Activity:   . Days of Exercise per Week: Not on file  . Minutes of Exercise per Session: Not on file  Stress:   . Feeling of Stress : Not on file  Social Connections:   . Frequency of Communication with Friends and Family: Not on file  . Frequency of Social Gatherings with Friends and Family: Not on file  . Attends Religious Services: Not on file  . Active Member of Clubs or Organizations: Not on file  .  Attends Banker Meetings: Not on file  . Marital Status: Not on file  Intimate Partner Violence:   . Fear of Current or Ex-Partner: Not on file  . Emotionally Abused: Not on file  . Physically Abused: Not on file  . Sexually Abused: Not on file    Outpatient Medications Prior to Visit  Medication Sig Dispense Refill  . rivaroxaban (XARELTO) 20 MG TABS tablet Take 20 mg by mouth daily at 8 pm.     Facility-Administered Medications Prior to Visit  Medication Dose Route Frequency Provider Last Rate Last Admin  . cyanocobalamin ((VITAMIN B-12)) injection 1,000 mcg  1,000 mcg Intramuscular Q30 days Creig Hines, MD   1,000 mcg at 10/09/18 1116    Allergies  Allergen Reactions  . Food Other (See Comments)    Pumpkins/squash/zucchini (gourds foods) swelling of the face  . Tape Itching, Dermatitis, Rash and Other (See Comments)    Transpore tape  . Povidone Iodine   . Codeine Other (See Comments)    Crawling skin  . Shellfish Allergy Other (See Comments)    Crawling skin/stomach cramps.    ROS Review of Systems  Constitutional: Negative.   Eyes: Negative.   Respiratory: Negative.   Cardiovascular: Negative.   Genitourinary: Negative.   Neurological: Negative.   Hematological: Bruises/bleeds easily.  Psychiatric/Behavioral: Negative.       Objective:    Physical Exam N physical exam was done BP 140/75  Wt Readings from Last 3 Encounters:  12/18/18 177 lb (80.3 kg)  10/09/18 180 lb (81.6 kg)  09/24/18 180 lb 6.4 oz (81.8 kg)     Health Maintenance Due  Topic Date Due  . Hepatitis C Screening  1960/10/16  . TETANUS/TDAP  12/22/1980  . PAP SMEAR-Modifier  12/23/1982  . MAMMOGRAM  12/23/2011    There are no preventive care reminders to display for this patient.  Lab Results  Component Value Date   TSH 2.300 02/20/2019   Lab Results  Component Value Date   WBC 6.6 02/20/2019   HGB 15.8 02/20/2019   HCT 46.3 02/20/2019   MCV 92 02/20/2019    PLT 249 02/20/2019   Lab Results  Component Value Date   NA 139 08/24/2018   K 4.4 08/24/2018   CO2 25 08/24/2018   GLUCOSE 125 (H) 08/24/2018   BUN 9 08/24/2018   CREATININE 0.61 08/24/2018   BILITOT 1.6 (H) 08/11/2018   ALKPHOS 79 08/11/2018   AST 35 08/11/2018   ALT 11 08/11/2018   PROT 8.2 (H) 08/11/2018   ALBUMIN 4.3 08/11/2018   CALCIUM 9.3 08/24/2018   ANIONGAP 10 08/24/2018   Lab Results  Component Value Date   CHOL 241 (H) 02/20/2019   Lab Results  Component Value Date   HDL 69 02/20/2019   Lab Results  Component Value Date   LDLCALC 160 (H) 02/20/2019   Lab Results  Component Value Date   TRIG 69 02/20/2019   Lab Results  Component Value Date   CHOLHDL 3.5 02/20/2019   Lab Results  Component Value Date   HGBA1C 5.1 02/20/2019      Assessment & Plan:   1. Elevated lipids -Her LDL was 160 mg per DL and she defers statin therapy.  Her ASCVD risk was 2.8% and she defers statin therapy and states that she will modify her diet.  She was advised to continue on -Low fat Diet, like low fat dairy products eg skimmed milk -Avoid any fried food -Regular exercise/walk -Goal for Total Cholesterol is less than 200 -Goal for bad cholesterol LDL is less than 70 -Goal for Good cholesterol HDL is more than 45 -Goal for Triglyceride is less than 150 -We will recheck lipid panel; Future  2. Elevated blood pressure reading -With her blood pressure of 140/75, she is at stage II hypertension.  She did says antihypertensive therapy, she states that she will modify her diet, check her blood pressure regularly. -She was advised to continue on low salt DASH diet -Take medications regularly on time -Exercise regularly as tolerated -Check blood pressure at least once a week at home or a nearby pharmacy and record -Goal is less than 140/90 and normal blood pressure is 120/80        Follow-up: Return in about 13 weeks (around 05/28/2019), or if symptoms worsen or fail  to improve.    Danis Pembleton Trellis Paganini, NP

## 2019-02-26 NOTE — Patient Instructions (Signed)
Fat and Cholesterol Restricted Eating Plan Getting too much fat and cholesterol in your diet may cause health problems. Choosing the right foods helps keep your fat and cholesterol at normal levels. This can keep you from getting certain diseases. Your doctor may recommend an eating plan that includes:  Total fat: ______% or less of total calories a day.  Saturated fat: ______% or less of total calories a day.  Cholesterol: less than _________mg a day.  Fiber: ______g a day. What are tips for following this plan? Meal planning  At meals, divide your plate into four equal parts: ? Fill one-half of your plate with vegetables and green salads. ? Fill one-fourth of your plate with whole grains. ? Fill one-fourth of your plate with low-fat (lean) protein foods.  Eat fish that is high in omega-3 fats at least two times a week. This includes mackerel, tuna, sardines, and salmon.  Eat foods that are high in fiber, such as whole grains, beans, apples, broccoli, carrots, peas, and barley. General tips   Work with your doctor to lose weight if you need to.  Avoid: ? Foods with added sugar. ? Fried foods. ? Foods with partially hydrogenated oils.  Limit alcohol intake to no more than 1 drink a day for nonpregnant women and 2 drinks a day for men. One drink equals 12 oz of beer, 5 oz of wine, or 1 oz of hard liquor. Reading food labels  Check food labels for: ? Trans fats. ? Partially hydrogenated oils. ? Saturated fat (g) in each serving. ? Cholesterol (mg) in each serving. ? Fiber (g) in each serving.  Choose foods with healthy fats, such as: ? Monounsaturated fats. ? Polyunsaturated fats. ? Omega-3 fats.  Choose grain products that have whole grains. Look for the word "whole" as the first word in the ingredient list. Cooking  Cook foods using low-fat methods. These include baking, boiling, grilling, and broiling.  Eat more home-cooked foods. Eat at restaurants and buffets  less often.  Avoid cooking using saturated fats, such as butter, cream, palm oil, palm kernel oil, and coconut oil. Recommended foods  Fruits  All fresh, canned (in natural juice), or frozen fruits. Vegetables  Fresh or frozen vegetables (raw, steamed, roasted, or grilled). Green salads. Grains  Whole grains, such as whole wheat or whole grain breads, crackers, cereals, and pasta. Unsweetened oatmeal, bulgur, barley, quinoa, or brown rice. Corn or whole wheat flour tortillas. Meats and other protein foods  Ground beef (85% or leaner), grass-fed beef, or beef trimmed of fat. Skinless chicken or turkey. Ground chicken or turkey. Pork trimmed of fat. All fish and seafood. Egg whites. Dried beans, peas, or lentils. Unsalted nuts or seeds. Unsalted canned beans. Nut butters without added sugar or oil. Dairy  Low-fat or nonfat dairy products, such as skim or 1% milk, 2% or reduced-fat cheeses, low-fat and fat-free ricotta or cottage cheese, or plain low-fat and nonfat yogurt. Fats and oils  Tub margarine without trans fats. Light or reduced-fat mayonnaise and salad dressings. Avocado. Olive, canola, sesame, or safflower oils. The items listed above may not be a complete list of foods and beverages you can eat. Contact a dietitian for more information. Foods to avoid Fruits  Canned fruit in heavy syrup. Fruit in cream or butter sauce. Fried fruit. Vegetables  Vegetables cooked in cheese, cream, or butter sauce. Fried vegetables. Grains  White bread. White pasta. White rice. Cornbread. Bagels, pastries, and croissants. Crackers and snack foods that contain trans fat   and hydrogenated oils. Meats and other protein foods  Fatty cuts of meat. Ribs, chicken wings, bacon, sausage, bologna, salami, chitterlings, fatback, hot dogs, bratwurst, and packaged lunch meats. Liver and organ meats. Whole eggs and egg yolks. Chicken and turkey with skin. Fried meat. Dairy  Whole or 2% milk, cream,  half-and-half, and cream cheese. Whole milk cheeses. Whole-fat or sweetened yogurt. Full-fat cheeses. Nondairy creamers and whipped toppings. Processed cheese, cheese spreads, and cheese curds. Beverages  Alcohol. Sugar-sweetened drinks such as sodas, lemonade, and fruit drinks. Fats and oils  Butter, stick margarine, lard, shortening, ghee, or bacon fat. Coconut, palm kernel, and palm oils. Sweets and desserts  Corn syrup, sugars, honey, and molasses. Candy. Jam and jelly. Syrup. Sweetened cereals. Cookies, pies, cakes, donuts, muffins, and ice cream. The items listed above may not be a complete list of foods and beverages you should avoid. Contact a dietitian for more information. Summary  Choosing the right foods helps keep your fat and cholesterol at normal levels. This can keep you from getting certain diseases.  At meals, fill one-half of your plate with vegetables and green salads.  Eat high-fiber foods, like whole grains, beans, apples, carrots, peas, and barley.  Limit added sugar, saturated fats, alcohol, and fried foods. This information is not intended to replace advice given to you by your health care provider. Make sure you discuss any questions you have with your health care provider. Document Revised: 10/04/2017 Document Reviewed: 10/18/2016 Elsevier Patient Education  2020 [employer] Inc[employer] DASH Eating Plan DASH stands for "Dietary Approaches to Stop Hypertension." The DASH eating plan is a healthy eating plan that has been shown to reduce high blood pressure (hypertension). It may also reduce your risk for type 2 diabetes, heart disease, and stroke. The DASH eating plan may also help with weight loss. What are tips for following this plan?  General guidelines  Avoid eating more than 2,300 mg (milligrams) of salt (sodium) a day. If you have hypertension, you may need to reduce your sodium intake to 1,500 mg a day.  Limit alcohol intake to no more than 1 drink a day for  nonpregnant women and 2 drinks a day for men. One drink equals 12 oz of beer, 5 oz of wine, or 1 oz of hard liquor.  Work with your health care provider to maintain a healthy body weight or to lose weight. Ask what an ideal weight is for you.  Get at least 30 minutes of exercise that causes your heart to beat faster (aerobic exercise) most days of the week. Activities may include walking, swimming, or biking.  Work with your health care provider or diet and nutrition specialist (dietitian) to adjust your eating plan to your individual calorie needs. Reading food labels   Check food labels for the amount of sodium per serving. Choose foods with less than 5 percent of the Daily Value of sodium. Generally, foods with less than 300 mg of sodium per serving fit into this eating plan.  To find whole grains, look for the word "whole" as the first word in the ingredient list. Shopping  Buy products labeled as "low-sodium" or "no salt added."  Buy fresh foods. Avoid canned foods and premade or frozen meals. Cooking  Avoid adding salt when cooking. Use salt-free seasonings or herbs instead of table salt or sea salt. Check with your health care provider or pharmacist before using salt substitutes.  Do not fry foods. Cook foods using healthy methods such as baking, boiling, grilling,   and broiling instead.  Cook with heart-healthy oils, such as olive, canola, soybean, or sunflower oil. Meal planning  Eat a balanced diet that includes: ? 5 or more servings of fruits and vegetables each day. At each meal, try to fill half of your plate with fruits and vegetables. ? Up to 6-8 servings of whole grains each day. ? Less than 6 oz of lean meat, poultry, or fish each day. A 3-oz serving of meat is about the same size as a deck of cards. One egg equals 1 oz. ? 2 servings of low-fat dairy each day. ? A serving of nuts, seeds, or beans 5 times each week. ? Heart-healthy fats. Healthy fats called Omega-3  fatty acids are found in foods such as flaxseeds and coldwater fish, like sardines, salmon, and mackerel.  Limit how much you eat of the following: ? Canned or prepackaged foods. ? Food that is high in trans fat, such as fried foods. ? Food that is high in saturated fat, such as fatty meat. ? Sweets, desserts, sugary drinks, and other foods with added sugar. ? Full-fat dairy products.  Do not salt foods before eating.  Try to eat at least 2 vegetarian meals each week.  Eat more home-cooked food and less restaurant, buffet, and fast food.  When eating at a restaurant, ask that your food be prepared with less salt or no salt, if possible. What foods are recommended? The items listed may not be a complete list. Talk with your dietitian about what dietary choices are best for you. Grains Whole-grain or whole-wheat bread. Whole-grain or whole-wheat pasta. Brown rice. Oatmeal. Quinoa. Bulgur. Whole-grain and low-sodium cereals. Pita bread. Low-fat, low-sodium crackers. Whole-wheat flour tortillas. Vegetables Fresh or frozen vegetables (raw, steamed, roasted, or grilled). Low-sodium or reduced-sodium tomato and vegetable juice. Low-sodium or reduced-sodium tomato sauce and tomato paste. Low-sodium or reduced-sodium canned vegetables. Fruits All fresh, dried, or frozen fruit. Canned fruit in natural juice (without added sugar). Meat and other protein foods Skinless chicken or turkey. Ground chicken or turkey. Pork with fat trimmed off. Fish and seafood. Egg whites. Dried beans, peas, or lentils. Unsalted nuts, nut butters, and seeds. Unsalted canned beans. Lean cuts of beef with fat trimmed off. Low-sodium, lean deli meat. Dairy Low-fat (1%) or fat-free (skim) milk. Fat-free, low-fat, or reduced-fat cheeses. Nonfat, low-sodium ricotta or cottage cheese. Low-fat or nonfat yogurt. Low-fat, low-sodium cheese. Fats and oils Soft margarine without trans fats. Vegetable oil. Low-fat, reduced-fat, or  light mayonnaise and salad dressings (reduced-sodium). Canola, safflower, olive, soybean, and sunflower oils. Avocado. Seasoning and other foods Herbs. Spices. Seasoning mixes without salt. Unsalted popcorn and pretzels. Fat-free sweets. What foods are not recommended? The items listed may not be a complete list. Talk with your dietitian about what dietary choices are best for you. Grains Baked goods made with fat, such as croissants, muffins, or some breads. Dry pasta or rice meal packs. Vegetables Creamed or fried vegetables. Vegetables in a cheese sauce. Regular canned vegetables (not low-sodium or reduced-sodium). Regular canned tomato sauce and paste (not low-sodium or reduced-sodium). Regular tomato and vegetable juice (not low-sodium or reduced-sodium). Pickles. Olives. Fruits Canned fruit in a light or heavy syrup. Fried fruit. Fruit in cream or butter sauce. Meat and other protein foods Fatty cuts of meat. Ribs. Fried meat. Bacon. Sausage. Bologna and other processed lunch meats. Salami. Fatback. Hotdogs. Bratwurst. Salted nuts and seeds. Canned beans with added salt. Canned or smoked fish. Whole eggs or egg yolks. Chicken or turkey   with skin. Dairy Whole or 2% milk, cream, and half-and-half. Whole or full-fat cream cheese. Whole-fat or sweetened yogurt. Full-fat cheese. Nondairy creamers. Whipped toppings. Processed cheese and cheese spreads. Fats and oils Butter. Stick margarine. Lard. Shortening. Ghee. Bacon fat. Tropical oils, such as coconut, palm kernel, or palm oil. Seasoning and other foods Salted popcorn and pretzels. Onion salt, garlic salt, seasoned salt, table salt, and sea salt. Worcestershire sauce. Tartar sauce. Barbecue sauce. Teriyaki sauce. Soy sauce, including reduced-sodium. Steak sauce. Canned and packaged gravies. Fish sauce. Oyster sauce. Cocktail sauce. Horseradish that you find on the shelf. Ketchup. Mustard. Meat flavorings and tenderizers. Bouillon cubes. Hot  sauce and Tabasco sauce. Premade or packaged marinades. Premade or packaged taco seasonings. Relishes. Regular salad dressings. Where to find more information:  National Heart, Lung, and Blood Institute: www.nhlbi.nih.gov  American Heart Association: www.heart.org Summary  The DASH eating plan is a healthy eating plan that has been shown to reduce high blood pressure (hypertension). It may also reduce your risk for type 2 diabetes, heart disease, and stroke.  With the DASH eating plan, you should limit salt (sodium) intake to 2,300 mg a day. If you have hypertension, you may need to reduce your sodium intake to 1,500 mg a day.  When on the DASH eating plan, aim to eat more fresh fruits and vegetables, whole grains, lean proteins, low-fat dairy, and heart-healthy fats.  Work with your health care provider or diet and nutrition specialist (dietitian) to adjust your eating plan to your individual calorie needs. This information is not intended to replace advice given to you by your health care provider. Make sure you discuss any questions you have with your health care provider. Document Revised: 01/13/2017 Document Reviewed: 01/25/2016 Elsevier Patient Education  2020 Elsevier Inc.  

## 2019-03-18 ENCOUNTER — Other Ambulatory Visit: Payer: Self-pay

## 2019-03-18 ENCOUNTER — Ambulatory Visit: Payer: Self-pay | Admitting: Pharmacy Technician

## 2019-03-18 DIAGNOSIS — Z79899 Other long term (current) drug therapy: Secondary | ICD-10-CM

## 2019-03-18 NOTE — Progress Notes (Signed)
Completed [facility] application and contract.  Patient agreed to all terms of the [facility] contract.    Patient approved to receive medication assistance at [facility] until re-certification in 5364, and as long as eligibility criteria continues to be met.    Provided patient with community resource material based on her particular needs.    [facility]

## 2019-03-21 ENCOUNTER — Other Ambulatory Visit: Payer: Self-pay

## 2019-03-21 MED ORDER — RIVAROXABAN 20 MG PO TABS: 20.0000 mg | ORAL_TABLET | Freq: Every day | ORAL | 0 refills | Status: DC

## 2019-04-08 ENCOUNTER — Encounter: Payer: Self-pay | Admitting: Pharmacist

## 2019-04-08 ENCOUNTER — Ambulatory Visit: Payer: Self-pay | Admitting: Pharmacist

## 2019-04-08 ENCOUNTER — Other Ambulatory Visit: Payer: Self-pay

## 2019-04-08 DIAGNOSIS — Z79899 Other long term (current) drug therapy: Secondary | ICD-10-CM

## 2019-04-08 NOTE — Progress Notes (Signed)
  Medication Management Clinic Visit Note  Patient: Jasmine [name] MRN: [id number] Date of Birth: Jul 28, 1960 PCP: Rolm Gala, NP   [name] 59 y.o. female presents for a telephone medication therapy management review.   There were no vitals taken for this visit.  Patient Information   Past Medical History:  Diagnosis Date  . Anemia   . Arthritis    LEFT SHOULDER  . H/O cerebral venous sinus thrombosis 2013  . History of hiatal hernia   . History of kidney stones       Past Surgical History:  Procedure Laterality Date  . COLONOSCOPY WITH PROPOFOL N/A 07/02/2018   Procedure: COLONOSCOPY WITH PROPOFOL;  Surgeon: Pasty Spillers, MD;  Location: [facility] ENDOSCOPY;  Service: Endoscopy;  Laterality: N/A;  . ENTEROSCOPY N/A 07/17/2018   Procedure: ENTEROSCOPY;  Surgeon: Wyline Mood, MD;  Location: [facility] ENDOSCOPY;  Service: Gastroenterology;  Laterality: N/A;  . ESOPHAGOGASTRODUODENOSCOPY N/A 2001   approximately- for Reflux  . ESOPHAGOGASTRODUODENOSCOPY (EGD) WITH PROPOFOL N/A 07/02/2018   Procedure: ESOPHAGOGASTRODUODENOSCOPY (EGD) WITH PROPOFOL;  Surgeon: Pasty Spillers, MD;  Location: [facility] ENDOSCOPY;  Service: Endoscopy;  Laterality: N/A;  . ESOPHAGOGASTRODUODENOSCOPY (EGD) WITH PROPOFOL N/A 07/11/2018   Procedure: ESOPHAGOGASTRODUODENOSCOPY (EGD) WITH PROPOFOL;  Surgeon: Sherrilyn Rist, MD;  Location: [facility] ENDOSCOPY;  Service: Gastroenterology;  Laterality: N/A;  PT TO HAVE CAPSULE PLACEMENT  . GIVENS CAPSULE STUDY N/A 07/11/2018   Procedure: GIVENS CAPSULE STUDY;  Surgeon: Sherrilyn Rist, MD;  Location: [facility] ENDOSCOPY;  Service: Gastroenterology;  Laterality: N/A;  . HIATAL HERNIA REPAIR  08/23/2018   XI ROBOTIC ASSISTED HIATAL HERNIA REPAIR  WITH FUNDOPLICATION     Family History  Problem Relation Age of Onset  . Breast cancer Mother   . Bone cancer Mother     New Diagnoses (since last visit):   Family Support: Good  Lifestyle Diet: Avoids take-out.  Watches salt intake. Uses fresh food at home. Avoids fried foods. Using olive oil.           Social History   Substance and Sexual Activity  Alcohol Use Yes   Comment: occasional      Social History   Tobacco Use  Smoking Status Never Smoker  Smokeless Tobacco Never Used      Health Maintenance  Topic Date Due  . Hepatitis C Screening  Aug 08, 1960  . TETANUS/TDAP  12/22/1980  . PAP SMEAR-Modifier  12/23/1982  . MAMMOGRAM  12/23/2011  . INFLUENZA VACCINE  05/15/2019 (Originally 09/15/2018)  . COLONOSCOPY  07/01/2028  . HIV Screening  Completed   Health Maintenance/Date Completed  Last ED visit: 06/2018 Right upper quadrant pain, anemia; 07/2018 epigastric pain Last Visit to PCP: 02/2019 Next Visit to PCP: 05/2019 Specialist Visit: 09/2018 GI, Dr. Tobi Bastos;  09/2018 [facility], Dr. Smith Robert Dental Exam: none recent Eye Exam: none recent (last exam 2008) Prostate Exam: n/a Pelvic/PAP Exam: none recent Mammogram: none recent Colonoscopy: 07/02/2018 Flu Vaccine: none recent Pneumonia Vaccine: no COVID-19 Vaccine: no Shingrix Vaccine: no  ASSESSMENT:  Adherence: Uses a pill box. States she takes her medication at supper and rarely misses a dose.  Venous Sinus Thrombosis: 2013, 2018 Xarelto since 2018, previously on Coumadin  Iron Deficiency Anemia: History of IV iron and B12 replacement. Follow up with Dr. Smith Robert 04/12/19 (labs and appointment)  Hiatal Hernia: s/p Nissen fundoplication with repair of hiatal hernia7/2020  Nyzaiah Kai K. Joelene Millin, PharmD [facility] Clinic-Pharmacy Operations Coordinator [phone]

## 2019-04-09 ENCOUNTER — Other Ambulatory Visit: Payer: Self-pay

## 2019-04-12 ENCOUNTER — Encounter: Payer: Self-pay | Admitting: Oncology

## 2019-04-12 ENCOUNTER — Inpatient Hospital Stay: Payer: Self-pay | Attending: Oncology | Admitting: Oncology

## 2019-04-12 ENCOUNTER — Other Ambulatory Visit: Payer: Self-pay

## 2019-04-12 ENCOUNTER — Inpatient Hospital Stay: Payer: Self-pay

## 2019-04-12 VITALS — BP 175/117 | HR 54 | Temp 96.5°F | Resp 16 | Ht 64.0 in | Wt 176.0 lb

## 2019-04-12 DIAGNOSIS — R5383 Other fatigue: Secondary | ICD-10-CM | POA: Insufficient documentation

## 2019-04-12 DIAGNOSIS — D509 Iron deficiency anemia, unspecified: Secondary | ICD-10-CM | POA: Insufficient documentation

## 2019-04-12 DIAGNOSIS — Z86718 Personal history of other venous thrombosis and embolism: Secondary | ICD-10-CM

## 2019-04-12 DIAGNOSIS — E538 Deficiency of other specified B group vitamins: Secondary | ICD-10-CM | POA: Insufficient documentation

## 2019-04-12 DIAGNOSIS — Z7901 Long term (current) use of anticoagulants: Secondary | ICD-10-CM | POA: Insufficient documentation

## 2019-04-12 LAB — CBC WITH DIFFERENTIAL/PLATELET
Abs Immature Granulocytes: 0.02 10*3/uL (ref 0.00–0.07)
Basophils Absolute: 0 10*3/uL (ref 0.0–0.1)
Basophils Relative: 1 %
Eosinophils Absolute: 0.3 10*3/uL (ref 0.0–0.5)
Eosinophils Relative: 4 %
HCT: 46.1 % — ABNORMAL HIGH (ref 36.0–46.0)
Hemoglobin: 14.8 g/dL (ref 12.0–15.0)
Immature Granulocytes: 0 %
Lymphocytes Relative: 24 %
Lymphs Abs: 1.6 10*3/uL (ref 0.7–4.0)
MCH: 30.8 pg (ref 26.0–34.0)
MCHC: 32.1 g/dL (ref 30.0–36.0)
MCV: 95.8 fL (ref 80.0–100.0)
Monocytes Absolute: 0.3 10*3/uL (ref 0.1–1.0)
Monocytes Relative: 5 %
Neutro Abs: 4.6 10*3/uL (ref 1.7–7.7)
Neutrophils Relative %: 66 %
Platelets: 144 10*3/uL — ABNORMAL LOW (ref 150–400)
RBC: 4.81 MIL/uL (ref 3.87–5.11)
RDW: 11.9 % (ref 11.5–15.5)
WBC: 6.8 10*3/uL (ref 4.0–10.5)
nRBC: 0 % (ref 0.0–0.2)

## 2019-04-12 LAB — IRON AND TIBC
Iron: 69 ug/dL (ref 28–170)
Saturation Ratios: 15 % (ref 10.4–31.8)
TIBC: 462 ug/dL — ABNORMAL HIGH (ref 250–450)
UIBC: 393 ug/dL

## 2019-04-12 LAB — FERRITIN: Ferritin: 21 ng/mL (ref 11–307)

## 2019-04-12 NOTE — Progress Notes (Signed)
Patient stated that she had been doing well with no complaints. 

## 2019-04-15 NOTE — Progress Notes (Signed)
Hematology/Oncology Consult note [facility]  [phone] [phone]  Patient Care Team: Rolm Gala, NP as PCP - General (Gerontology)   Name of the patient: Jasmine [name]  [id number]  [birth year]   Date of visit: 04/15/19  Diagnosis- 1. Recurrent cerebral venous sinus thrombosis 2. Iron deficiency anemia   Chief complaint/ Reason for visit-routine follow-up of iron deficiency anemia likely secondary to hiatal hernia s/p Nissen fundoplication  Heme/Onc history: patient is a 59 year old Caucasian female with a history ofExtensive dural venous sinus thrombosis back in 2013. She was started on Coumadin at that time. She had hypercoagulable work-up done including prothrombin gene mutation, protein C, protein S activity, factor V activity and Antithrombin III levels which were unremarkable. She also had work-up for antiphospholipid antibody syndrome which was negative. No apparent cause for her dural venous sinus thrombosis was found at that time. She continues to follow-up with neurology and was seen by them in 2016. At that time her Coumadin was stopped as repeat MRI showed significant improvement in these findings and patient has not had any current strokes. She was switched to low-dose aspirin. She then presented with a second episode of dural venous sinus thrombosis in 2018. At that time she was started on rivaraoxaban and she continues to be on that. CBC in September 2018 showed a white count of 14.9, hemoglobin interval 9/30.4 with an MCV of 71.2 and a platelet count of 326 which was lower as compared to her baseline hemoglobin of 14 in 2013.  She now presents to the hospital with right upper quadrant abdominal pain and she was found to haveNew onset anemia with a hemoglobin of 6.5/25.3 with an MCV of 68.9 and was hence admitted. B12 levels were low at 100. Ferritin levels were low at 2 and iron studies showed elevated TIBC  of 580 with a low iron saturation of 7%. Folate levels were normal.  egd colonoscopy and capsule study were unremarkable except for large hiatal hernia which was likely the cause of her iron deficiency anemia. She underwent  Nissen fundoplication in July 2020.   Interval history-patient has not had any recurrence of thrombotic events.  She remains on Eliquis.  Has mild fatigue but denies other complaints.  Denies any bleeding in her stool or urine  ECOG PS- 1 Pain scale- 0  Review of systems- Review of Systems  Constitutional: Positive for malaise/fatigue. Negative for chills, fever and weight loss.  HENT: Negative for congestion, ear discharge and nosebleeds.   Eyes: Negative for blurred vision.  Respiratory: Negative for cough, hemoptysis, sputum production, shortness of breath and wheezing.   Cardiovascular: Negative for chest pain, palpitations, orthopnea and claudication.  Gastrointestinal: Negative for abdominal pain, blood in stool, constipation, diarrhea, heartburn, melena, nausea and vomiting.  Genitourinary: Negative for dysuria, flank pain, frequency, hematuria and urgency.  Musculoskeletal: Negative for back pain, joint pain and myalgias.  Skin: Negative for rash.  Neurological: Negative for dizziness, tingling, focal weakness, seizures, weakness and headaches.  Endo/Heme/Allergies: Does not bruise/bleed easily.  Psychiatric/Behavioral: Negative for depression and suicidal ideas. The patient does not have insomnia.       Allergies  Allergen Reactions  . Food Other (See Comments)    Pumpkins/squash/zucchini (gourds foods) swelling of the face  . Tape Itching, Dermatitis, Rash and Other (See Comments)    Transpore tape  . Povidone Iodine   . Codeine Other (See Comments)    Crawling skin  . Shellfish Allergy Other (See Comments)  Crawling skin/stomach cramps.     Past Medical History:  Diagnosis Date  . Anemia   . Arthritis    LEFT SHOULDER  . H/O cerebral  venous sinus thrombosis 2013  . History of hiatal hernia   . History of kidney stones      Past Surgical History:  Procedure Laterality Date  . COLONOSCOPY WITH PROPOFOL N/A 07/02/2018   Procedure: COLONOSCOPY WITH PROPOFOL;  Surgeon: Pasty Spillers, MD;  Location: [facility] ENDOSCOPY;  Service: Endoscopy;  Laterality: N/A;  . ENTEROSCOPY N/A 07/17/2018   Procedure: ENTEROSCOPY;  Surgeon: Wyline Mood, MD;  Location: [facility] ENDOSCOPY;  Service: Gastroenterology;  Laterality: N/A;  . ESOPHAGOGASTRODUODENOSCOPY N/A 2001   approximately- for Reflux  . ESOPHAGOGASTRODUODENOSCOPY (EGD) WITH PROPOFOL N/A 07/02/2018   Procedure: ESOPHAGOGASTRODUODENOSCOPY (EGD) WITH PROPOFOL;  Surgeon: Pasty Spillers, MD;  Location: [facility] ENDOSCOPY;  Service: Endoscopy;  Laterality: N/A;  . ESOPHAGOGASTRODUODENOSCOPY (EGD) WITH PROPOFOL N/A 07/11/2018   Procedure: ESOPHAGOGASTRODUODENOSCOPY (EGD) WITH PROPOFOL;  Surgeon: Sherrilyn Rist, MD;  Location: [facility] ENDOSCOPY;  Service: Gastroenterology;  Laterality: N/A;  PT TO HAVE CAPSULE PLACEMENT  . GIVENS CAPSULE STUDY N/A 07/11/2018   Procedure: GIVENS CAPSULE STUDY;  Surgeon: Sherrilyn Rist, MD;  Location: [facility] ENDOSCOPY;  Service: Gastroenterology;  Laterality: N/A;  . HIATAL HERNIA REPAIR  08/23/2018   XI ROBOTIC ASSISTED HIATAL HERNIA REPAIR  WITH FUNDOPLICATION    Social History   Socioeconomic History  . Marital status: Single    Spouse name: Not on file  . Number of children: Not on file  . Years of education: Not on file  . Highest education level: Not on file  Occupational History  . Not on file  Tobacco Use  . Smoking status: Never Smoker  . Smokeless tobacco: Never Used  Substance and Sexual Activity  . Alcohol use: Yes    Comment: occasional  . Drug use: Yes    Types: Marijuana  . Sexual activity: Not on file  Other Topics Concern  . Not on file  Social History Narrative   Lives at home with her parents.  Independent at baseline.    Social Determinants of Health   Financial Resource Strain:   . Difficulty of Paying Living Expenses: Not on file  Food Insecurity:   . Worried About Programme researcher, broadcasting/film/video in the Last Year: Not on file  . Ran Out of Food in the Last Year: Not on file  Transportation Needs:   . Lack of Transportation (Medical): Not on file  . Lack of Transportation (Non-Medical): Not on file  Physical Activity:   . Days of Exercise per Week: Not on file  . Minutes of Exercise per Session: Not on file  Stress:   . Feeling of Stress : Not on file  Social Connections:   . Frequency of Communication with Friends and Family: Not on file  . Frequency of Social Gatherings with Friends and Family: Not on file  . Attends Religious Services: Not on file  . Active Member of Clubs or Organizations: Not on file  . Attends Banker Meetings: Not on file  . Marital Status: Not on file  Intimate Partner Violence:   . Fear of Current or Ex-Partner: Not on file  . Emotionally Abused: Not on file  . Physically Abused: Not on file  . Sexually Abused: Not on file    Family History  Problem Relation Age of Onset  . Breast cancer Mother   . Bone cancer  Mother   . Obesity Father      Current Outpatient Medications:  .  rivaroxaban (XARELTO) 20 MG TABS tablet, Take 1 tablet (20 mg total) by mouth daily at 8 pm., Disp: 90 tablet, Rfl: 0 No current facility-administered medications for this visit.  Facility-Administered Medications Ordered in Other Visits:  .  cyanocobalamin ((VITAMIN B-12)) injection 1,000 mcg, 1,000 mcg, Intramuscular, Q30 days, Sindy Guadeloupe, MD, 1,000 mcg at 10/09/18 1116  Physical exam:  Vitals:   04/12/19 1021  BP: (!) 175/117  Pulse: (!) 54  Resp: 16  Temp: (!) 96.5 F (35.8 C)  TempSrc: Tympanic  SpO2: 99%  Weight: 176 lb (79.8 kg)  Height: 5\' 4"  (1.626 m)   Physical Exam Constitutional:      General: She is not in acute distress. HENT:     Head: Normocephalic  and atraumatic.  Eyes:     Pupils: Pupils are equal, round, and reactive to light.  Cardiovascular:     Rate and Rhythm: Normal rate and regular rhythm.     Heart sounds: Normal heart sounds.  Pulmonary:     Effort: Pulmonary effort is normal.     Breath sounds: Normal breath sounds.  Abdominal:     General: Bowel sounds are normal.     Palpations: Abdomen is soft.  Musculoskeletal:     Cervical back: Normal range of motion.  Skin:    General: Skin is warm and dry.  Neurological:     Mental Status: She is alert and oriented to person, place, and time.      CMP Latest Ref Rng & Units 08/24/2018  Glucose 70 - 99 mg/dL 125(H)  BUN 6 - 20 mg/dL 9  Creatinine 0.44 - 1.00 mg/dL 0.61  Sodium 135 - 145 mmol/L 139  Potassium 3.5 - 5.1 mmol/L 4.4  Chloride 98 - 111 mmol/L 104  CO2 22 - 32 mmol/L 25  Calcium 8.9 - 10.3 mg/dL 9.3  Total Protein 6.5 - 8.1 g/dL -  Total Bilirubin 0.3 - 1.2 mg/dL -  Alkaline Phos 38 - 126 U/L -  AST 15 - 41 U/L -  ALT 0 - 44 U/L -   CBC Latest Ref Rng & Units 04/12/2019  WBC 4.0 - 10.5 K/uL 6.8  Hemoglobin 12.0 - 15.0 g/dL 14.8  Hematocrit 36.0 - 46.0 % 46.1(H)  Platelets 150 - 400 K/uL 144(L)     Assessment and plan- Patient is a 59 y.o. female with history of iron deficiency anemia secondary to hiatal hernia s/p Nissen fundoplication  Iron deficiency anemia: Patient does not currently anemic. Her iron studies show a low ferritin of 21 and elevated TIBC of 462.  We will call her and let her know about these iron study results and see if she would be interested in receiving IV iron at this time.  Otherwise repeat CBC ferritin and iron studies in 4 and 8 months and I will see her back back in 8 months for a video visit.  We will also check a B12 level at that time  History of cerebral venous thrombosis: He has had 2-3 recurrent events and will remain on lifelong Eliquis.  She follows up with [facility] for this but I will be happy to renew her Eliquis  prescriptions if she wants to see fewer doctors  Uncontrolled hypertension: She will need to follow-up with her PCP regarding this   Visit Diagnosis 1. Iron deficiency anemia, unspecified iron deficiency anemia type   2. History of  cerebral venous sinus thrombosis      Dr. Owens Shark, MD, MPH [facility] at [facility] [phone] 04/15/2019 11:01 AM

## 2019-05-22 ENCOUNTER — Other Ambulatory Visit: Payer: Self-pay

## 2019-05-22 DIAGNOSIS — E785 Hyperlipidemia, unspecified: Secondary | ICD-10-CM

## 2019-05-22 DIAGNOSIS — D509 Iron deficiency anemia, unspecified: Secondary | ICD-10-CM

## 2019-05-22 NOTE — Progress Notes (Signed)
b12

## 2019-05-23 ENCOUNTER — Telehealth: Payer: Self-pay

## 2019-05-23 LAB — CBC WITH DIFFERENTIAL/PLATELET
Basophils Absolute: 0.1 10*3/uL (ref 0.0–0.2)
Basos: 1 %
EOS (ABSOLUTE): 0.2 10*3/uL (ref 0.0–0.4)
Eos: 3 %
Hematocrit: 44 % (ref 34.0–46.6)
Hemoglobin: 14.8 g/dL (ref 11.1–15.9)
Immature Grans (Abs): 0 10*3/uL (ref 0.0–0.1)
Immature Granulocytes: 0 %
Lymphocytes Absolute: 1.6 10*3/uL (ref 0.7–3.1)
Lymphs: 27 %
MCH: 30.5 pg (ref 26.6–33.0)
MCHC: 33.6 g/dL (ref 31.5–35.7)
MCV: 91 fL (ref 79–97)
Monocytes Absolute: 0.3 10*3/uL (ref 0.1–0.9)
Monocytes: 5 %
Neutrophils Absolute: 3.8 10*3/uL (ref 1.4–7.0)
Neutrophils: 64 %
Platelets: 258 10*3/uL (ref 150–450)
RBC: 4.85 x10E6/uL (ref 3.77–5.28)
RDW: 12.8 % (ref 11.7–15.4)
WBC: 5.9 10*3/uL (ref 3.4–10.8)

## 2019-05-23 LAB — LIPID PANEL
Chol/HDL Ratio: 3.8 ratio (ref 0.0–4.4)
Cholesterol, Total: 220 mg/dL — ABNORMAL HIGH (ref 100–199)
HDL: 58 mg/dL (ref >39)
LDL Chol Calc (NIH): 147 mg/dL — ABNORMAL HIGH (ref 0–99)
Triglycerides: 84 mg/dL (ref 0–149)
VLDL Cholesterol Cal: 15 mg/dL (ref 5–40)

## 2019-05-23 LAB — FERRITIN: Ferritin: 36 ng/mL (ref 15–150)

## 2019-05-23 LAB — IRON: Iron: 83 ug/dL (ref 27–159)

## 2019-05-23 LAB — VITAMIN B12: Vitamin B-12: 537 pg/mL (ref 232–1245)

## 2019-05-23 NOTE — Telephone Encounter (Signed)
Called pt on 4/8 at 1:47pm and left reminder about appt on 4/13 at 10am

## 2019-05-28 ENCOUNTER — Other Ambulatory Visit: Payer: Self-pay

## 2019-05-28 ENCOUNTER — Ambulatory Visit: Payer: Self-pay | Admitting: Gerontology

## 2019-05-28 ENCOUNTER — Encounter: Payer: Self-pay | Admitting: Gerontology

## 2019-05-28 VITALS — BP 152/92 | HR 60 | Ht 62.0 in | Wt 177.0 lb

## 2019-05-28 DIAGNOSIS — Z86718 Personal history of other venous thrombosis and embolism: Secondary | ICD-10-CM

## 2019-05-28 DIAGNOSIS — E785 Hyperlipidemia, unspecified: Secondary | ICD-10-CM

## 2019-05-28 DIAGNOSIS — I10 Essential (primary) hypertension: Secondary | ICD-10-CM | POA: Insufficient documentation

## 2019-05-28 DIAGNOSIS — Z0000 Encounter for general adult medical examination without abnormal findings: Secondary | ICD-10-CM

## 2019-05-28 DIAGNOSIS — G8929 Other chronic pain: Secondary | ICD-10-CM | POA: Insufficient documentation

## 2019-05-28 DIAGNOSIS — M25521 Pain in right elbow: Secondary | ICD-10-CM

## 2019-05-28 MED ORDER — AMLODIPINE BESYLATE 5 MG PO TABS: 5.0000 mg | ORAL_TABLET | Freq: Every day | ORAL | 0 refills | Status: DC

## 2019-05-28 NOTE — Patient Instructions (Addendum)
Managing Your Hypertension Hypertension is commonly called high blood pressure. This is when the force of your blood pressing against the walls of your arteries is too strong. Arteries are blood vessels that carry blood from your heart throughout your body. Hypertension forces the heart to work harder to pump blood, and may cause the arteries to become narrow or stiff. Having untreated or uncontrolled hypertension can cause heart attack, stroke, kidney disease, and other problems. What are blood pressure readings? A blood pressure reading consists of a higher number over a lower number. Ideally, your blood pressure should be below 120/80. The first ("top") number is called the systolic pressure. It is a measure of the pressure in your arteries as your heart beats. The second ("bottom") number is called the diastolic pressure. It is a measure of the pressure in your arteries as the heart relaxes. What does my blood pressure reading mean? Blood pressure is classified into four stages. Based on your blood pressure reading, your health care provider may use the following stages to determine what type of treatment you need, if any. Systolic pressure and diastolic pressure are measured in a unit called mm Hg. Normal  Systolic pressure: below 120.  Diastolic pressure: below 80. Elevated  Systolic pressure: 120-129.  Diastolic pressure: below 80. Hypertension stage 1  Systolic pressure: 130-139.  Diastolic pressure: 80-89. Hypertension stage 2  Systolic pressure: 140 or above.  Diastolic pressure: 90 or above. What health risks are associated with hypertension? Managing your hypertension is an important responsibility. Uncontrolled hypertension can lead to:  A heart attack.  A stroke.  A weakened blood vessel (aneurysm).  Heart failure.  Kidney damage.  Eye damage.  Metabolic syndrome.  Memory and concentration problems. What changes can I make to manage my  hypertension? Hypertension can be managed by making lifestyle changes and possibly by taking medicines. Your health care provider will help you make a plan to bring your blood pressure within a normal range. Eating and drinking   Eat a diet that is high in fiber and potassium, and low in salt (sodium), added sugar, and fat. An example eating plan is called the DASH (Dietary Approaches to Stop Hypertension) diet. To eat this way: ? Eat plenty of fresh fruits and vegetables. Try to fill half of your plate at each meal with fruits and vegetables. ? Eat whole grains, such as whole wheat pasta, brown rice, or whole grain bread. Fill about one quarter of your plate with whole grains. ? Eat low-fat diary products. ? Avoid fatty cuts of meat, processed or cured meats, and poultry with skin. Fill about one quarter of your plate with lean proteins such as fish, chicken without skin, beans, eggs, and tofu. ? Avoid premade and processed foods. These tend to be higher in sodium, added sugar, and fat.  Reduce your daily sodium intake. Most people with hypertension should eat less than 1,500 mg of sodium a day.  Limit alcohol intake to no more than 1 drink a day for nonpregnant women and 2 drinks a day for men. One drink equals 12 oz of beer, 5 oz of wine, or 1 oz of hard liquor. Lifestyle  Work with your health care provider to maintain a healthy body weight, or to lose weight. Ask what an ideal weight is for you.  Get at least 30 minutes of exercise that causes your heart to beat faster (aerobic exercise) most days of the week. Activities may include walking, swimming, or biking.  Include exercise   to strengthen your muscles (resistance exercise), such as weight lifting, as part of your weekly exercise routine. Try to do these types of exercises for 30 minutes at least 3 days a week.  Do not use any products that contain nicotine or tobacco, such as cigarettes and e-cigarettes. If you need help quitting,  ask your health care provider.  Control any long-term (chronic) conditions you have, such as high cholesterol or diabetes. Monitoring  Monitor your blood pressure at home as told by your health care provider. Your personal target blood pressure may vary depending on your medical conditions, your age, and other factors.  Have your blood pressure checked regularly, as often as told by your health care provider. Working with your health care provider  Review all the medicines you take with your health care provider because there may be side effects or interactions.  Talk with your health care provider about your diet, exercise habits, and other lifestyle factors that may be contributing to hypertension.  Visit your health care provider regularly. Your health care provider can help you create and adjust your plan for managing hypertension. Will I need medicine to control my blood pressure? Your health care provider may prescribe medicine if lifestyle changes are not enough to get your blood pressure under control, and if:  Your systolic blood pressure is 130 or higher.  Your diastolic blood pressure is 80 or higher. Take medicines only as told by your health care provider. Follow the directions carefully. Blood pressure medicines must be taken as prescribed. The medicine does not work as well when you skip doses. Skipping doses also puts you at risk for problems. Contact a health care provider if:  You think you are having a reaction to medicines you have taken.  You have repeated (recurrent) headaches.  You feel dizzy.  You have swelling in your ankles.  You have trouble with your vision. Get help right away if:  You develop a severe headache or confusion.  You have unusual weakness or numbness, or you feel faint.  You have severe pain in your chest or abdomen.  You vomit repeatedly.  You have trouble breathing. Summary  Hypertension is when the force of blood pumping  through your arteries is too strong. If this condition is not controlled, it may put you at risk for serious complications.  Your personal target blood pressure may vary depending on your medical conditions, your age, and other factors. For most people, a normal blood pressure is less than 120/80.  Hypertension is managed by lifestyle changes, medicines, or both. Lifestyle changes include weight loss, eating a healthy, low-sodium diet, exercising more, and limiting alcohol. This information is not intended to replace advice given to you by your health care provider. Make sure you discuss any questions you have with your health care provider. Document Revised: 05/25/2018 Document Reviewed: 12/30/2015 Elsevier Patient Education  2020 [employer]  Fat and Cholesterol Restricted Eating Plan Getting too much fat and cholesterol in your diet may cause health problems. Choosing the right foods helps keep your fat and cholesterol at normal levels. This can keep you from getting certain diseases. Your doctor may recommend an eating plan that includes:  Total fat: ______% or less of total calories a day.  Saturated fat: ______% or less of total calories a day.  Cholesterol: less than _________mg a day.  Fiber: ______g a day. What are tips for following this plan? Meal planning  At meals, divide your plate into four equal parts: ?   Fill one-half of your plate with vegetables and green salads. ? Fill one-fourth of your plate with whole grains. ? Fill one-fourth of your plate with low-fat (lean) protein foods.  Eat fish that is high in omega-3 fats at least two times a week. This includes mackerel, tuna, sardines, and salmon.  Eat foods that are high in fiber, such as whole grains, beans, apples, broccoli, carrots, peas, and barley. General tips   Work with your doctor to lose weight if you need to.  Avoid: ? Foods with added sugar. ? Fried foods. ? Foods with partially hydrogenated  oils.  Limit alcohol intake to no more than 1 drink a day for nonpregnant women and 2 drinks a day for men. One drink equals 12 oz of beer, 5 oz of wine, or 1 oz of hard liquor. Reading food labels  Check food labels for: ? Trans fats. ? Partially hydrogenated oils. ? Saturated fat (g) in each serving. ? Cholesterol (mg) in each serving. ? Fiber (g) in each serving.  Choose foods with healthy fats, such as: ? Monounsaturated fats. ? Polyunsaturated fats. ? Omega-3 fats.  Choose grain products that have whole grains. Look for the word "whole" as the first word in the ingredient list. Cooking  Cook foods using low-fat methods. These include baking, boiling, grilling, and broiling.  Eat more home-cooked foods. Eat at restaurants and buffets less often.  Avoid cooking using saturated fats, such as butter, cream, palm oil, palm kernel oil, and coconut oil. Recommended foods  Fruits  All fresh, canned (in natural juice), or frozen fruits. Vegetables  Fresh or frozen vegetables (raw, steamed, roasted, or grilled). Green salads. Grains  Whole grains, such as whole wheat or whole grain breads, crackers, cereals, and pasta. Unsweetened oatmeal, bulgur, barley, quinoa, or brown rice. Corn or whole wheat flour tortillas. Meats and other protein foods  Ground beef (85% or leaner), grass-fed beef, or beef trimmed of fat. Skinless chicken or turkey. Ground chicken or turkey. Pork trimmed of fat. All fish and seafood. Egg whites. Dried beans, peas, or lentils. Unsalted nuts or seeds. Unsalted canned beans. Nut butters without added sugar or oil. Dairy  Low-fat or nonfat dairy products, such as skim or 1% milk, 2% or reduced-fat cheeses, low-fat and fat-free ricotta or cottage cheese, or plain low-fat and nonfat yogurt. Fats and oils  Tub margarine without trans fats. Light or reduced-fat mayonnaise and salad dressings. Avocado. Olive, canola, sesame, or safflower oils. The items listed  above may not be a complete list of foods and beverages you can eat. Contact a dietitian for more information. Foods to avoid Fruits  Canned fruit in heavy syrup. Fruit in cream or butter sauce. Fried fruit. Vegetables  Vegetables cooked in cheese, cream, or butter sauce. Fried vegetables. Grains  White bread. White pasta. White rice. Cornbread. Bagels, pastries, and croissants. Crackers and snack foods that contain trans fat and hydrogenated oils. Meats and other protein foods  Fatty cuts of meat. Ribs, chicken wings, bacon, sausage, bologna, salami, chitterlings, fatback, hot dogs, bratwurst, and packaged lunch meats. Liver and organ meats. Whole eggs and egg yolks. Chicken and turkey with skin. Fried meat. Dairy  Whole or 2% milk, cream, half-and-half, and cream cheese. Whole milk cheeses. Whole-fat or sweetened yogurt. Full-fat cheeses. Nondairy creamers and whipped toppings. Processed cheese, cheese spreads, and cheese curds. Beverages  Alcohol. Sugar-sweetened drinks such as sodas, lemonade, and fruit drinks. Fats and oils  Butter, stick margarine, lard, shortening, ghee, or bacon fat.   Coconut, palm kernel, and palm oils. Sweets and desserts  Corn syrup, sugars, honey, and molasses. Candy. Jam and jelly. Syrup. Sweetened cereals. Cookies, pies, cakes, donuts, muffins, and ice cream. The items listed above may not be a complete list of foods and beverages you should avoid. Contact a dietitian for more information. Summary  Choosing the right foods helps keep your fat and cholesterol at normal levels. This can keep you from getting certain diseases.  At meals, fill one-half of your plate with vegetables and green salads.  Eat high-fiber foods, like whole grains, beans, apples, carrots, peas, and barley.  Limit added sugar, saturated fats, alcohol, and fried foods. This information is not intended to replace advice given to you by your health care provider. Make sure you  discuss any questions you have with your health care provider. Document Revised: 10/04/2017 Document Reviewed: 10/18/2016 Elsevier Patient Education  2020 Elsevier Inc.  

## 2019-05-28 NOTE — Progress Notes (Signed)
Established Patient Office Visit  Subjective:  Patient ID: Jasmine [name], female    DOB: 1960-06-08  Age: 59 y.o. MRN: [id number]  CC: No chief complaint on file.   HPI Jasmine [name] presents for  follow up of elevated blood pressure, and lab review.  She checks her blood pressure at home and it's usually between 120- 150's/80's. She states that she continues on DASH diet and exercises as tolerated. She continues to take 20 mg Rivaroxaban for history of venous sinus thrombosis, She admits to experiencing occasional mild bruises to her arms, but she denies hematuria, hematochezia and active bleeding. Her Lipid panel done on 05/22/2019, total cholesterol decreased from 241 mg/dl to 287 and LDL decreased from 160 mg/dl to 867 mg/dl. Curretly she states that she's been exepriencing non radiating sharp pain to her right elbow and hands when the weather changes. She reports having a history of arthritis and taking extra strenght Tylenol as needed, moderately relieves symptoms. Overall, she states that she's doing well and offers no further complaint.  Past Medical History:  Diagnosis Date  . Anemia   . Arthritis    LEFT SHOULDER  . H/O cerebral venous sinus thrombosis 2013  . History of hiatal hernia   . History of kidney stones     Past Surgical History:  Procedure Laterality Date  . COLONOSCOPY WITH PROPOFOL N/A 07/02/2018   Procedure: COLONOSCOPY WITH PROPOFOL;  Surgeon: Pasty Spillers, MD;  Location: [facility] ENDOSCOPY;  Service: Endoscopy;  Laterality: N/A;  . ENTEROSCOPY N/A 07/17/2018   Procedure: ENTEROSCOPY;  Surgeon: Wyline Mood, MD;  Location: [facility] ENDOSCOPY;  Service: Gastroenterology;  Laterality: N/A;  . ESOPHAGOGASTRODUODENOSCOPY N/A 2001   approximately- for Reflux  . ESOPHAGOGASTRODUODENOSCOPY (EGD) WITH PROPOFOL N/A 07/02/2018   Procedure: ESOPHAGOGASTRODUODENOSCOPY (EGD) WITH PROPOFOL;  Surgeon: Pasty Spillers, MD;  Location: [facility] ENDOSCOPY;  Service: Endoscopy;   Laterality: N/A;  . ESOPHAGOGASTRODUODENOSCOPY (EGD) WITH PROPOFOL N/A 07/11/2018   Procedure: ESOPHAGOGASTRODUODENOSCOPY (EGD) WITH PROPOFOL;  Surgeon: Sherrilyn Rist, MD;  Location: [facility] ENDOSCOPY;  Service: Gastroenterology;  Laterality: N/A;  PT TO HAVE CAPSULE PLACEMENT  . GIVENS CAPSULE STUDY N/A 07/11/2018   Procedure: GIVENS CAPSULE STUDY;  Surgeon: Sherrilyn Rist, MD;  Location: [facility] ENDOSCOPY;  Service: Gastroenterology;  Laterality: N/A;  . HIATAL HERNIA REPAIR  08/23/2018   XI ROBOTIC ASSISTED HIATAL HERNIA REPAIR  WITH FUNDOPLICATION    Family History  Problem Relation Age of Onset  . Breast cancer Mother   . Bone cancer Mother   . Obesity Father     Social History   Socioeconomic History  . Marital status: Single    Spouse name: Not on file  . Number of children: Not on file  . Years of education: Not on file  . Highest education level: Not on file  Occupational History  . Not on file  Tobacco Use  . Smoking status: Never Smoker  . Smokeless tobacco: Never Used  Substance and Sexual Activity  . Alcohol use: Yes    Comment: occasional  . Drug use: Yes    Types: Marijuana  . Sexual activity: Not on file  Other Topics Concern  . Not on file  Social History Narrative   Lives at home with her parents.  Independent at baseline.   Social Determinants of Health   Financial Resource Strain:   . Difficulty of Paying Living Expenses:   Food Insecurity:   . Worried About Programme researcher, broadcasting/film/video in the Last Year:   .  Ran Out of Food in the Last Year:   Transportation Needs:   . Film/video editor (Medical):   Marland Kitchen Lack of Transportation (Non-Medical):   Physical Activity:   . Days of Exercise per Week:   . Minutes of Exercise per Session:   Stress:   . Feeling of Stress :   Social Connections:   . Frequency of Communication with Friends and Family:   . Frequency of Social Gatherings with Friends and Family:   . Attends Religious Services:   . Active Member  of Clubs or Organizations:   . Attends Archivist Meetings:   Marland Kitchen Marital Status:   Intimate Partner Violence:   . Fear of Current or Ex-Partner:   . Emotionally Abused:   Marland Kitchen Physically Abused:   . Sexually Abused:     Outpatient Medications Prior to Visit  Medication Sig Dispense Refill  . rivaroxaban (XARELTO) 20 MG TABS tablet Take 1 tablet (20 mg total) by mouth daily at 8 pm. 90 tablet 0  . vitamin B-12 (CYANOCOBALAMIN) 500 MCG tablet Take 500 mcg by mouth daily.     Facility-Administered Medications Prior to Visit  Medication Dose Route Frequency Provider Last Rate Last Admin  . cyanocobalamin ((VITAMIN B-12)) injection 1,000 mcg  1,000 mcg Intramuscular Q30 days Sindy Guadeloupe, MD   1,000 mcg at 10/09/18 1116    Allergies  Allergen Reactions  . Food Other (See Comments)    Pumpkins/squash/zucchini (gourds foods) swelling of the face  . Tape Itching, Dermatitis, Rash and Other (See Comments)    Transpore tape  . Povidone Iodine   . Codeine Other (See Comments)    Crawling skin  . Shellfish Allergy Other (See Comments)    Crawling skin/stomach cramps.    ROS Review of Systems  Constitutional: Negative.   Respiratory: Negative.   Cardiovascular: Negative.   Neurological: Negative.   Hematological: Negative.   Psychiatric/Behavioral: Negative.       Objective:    Physical Exam  Constitutional: She is oriented to person, place, and time. She appears well-developed.  HENT:  Head: Normocephalic and atraumatic.  Eyes: Pupils are equal, round, and reactive to light. EOM are normal.  Cardiovascular: Normal rate and regular rhythm.  Pulmonary/Chest: Effort normal and breath sounds normal.  Musculoskeletal:        General: Normal range of motion.  Neurological: She is alert and oriented to person, place, and time.  Psychiatric: She has a normal mood and affect. Her behavior is normal. Judgment and thought content normal.    BP (!) 152/92 (BP Location:  Right Arm, Patient Position: Sitting)   Pulse 60   Ht 5\' 2"  (1.575 m)   Wt 177 lb (80.3 kg)   BMI 32.37 kg/m  Wt Readings from Last 3 Encounters:  05/28/19 177 lb (80.3 kg)  05/22/19 177 lb 12.8 oz (80.6 kg)  04/12/19 176 lb (79.8 kg)   She was advised to continue on her weight loss regimen.  Health Maintenance Due  Topic Date Due  . Hepatitis C Screening  Never done  . TETANUS/TDAP  Never done  . PAP SMEAR-Modifier  Never done  . MAMMOGRAM  Never done    There are no preventive care reminders to display for this patient.  Lab Results  Component Value Date   TSH 2.300 02/20/2019   Lab Results  Component Value Date   WBC 5.9 05/22/2019   HGB 14.8 05/22/2019   HCT 44.0 05/22/2019   MCV 91 05/22/2019  PLT 258 05/22/2019   Lab Results  Component Value Date   NA 139 08/24/2018   K 4.4 08/24/2018   CO2 25 08/24/2018   GLUCOSE 125 (H) 08/24/2018   BUN 9 08/24/2018   CREATININE 0.61 08/24/2018   BILITOT 1.6 (H) 08/11/2018   ALKPHOS 79 08/11/2018   AST 35 08/11/2018   ALT 11 08/11/2018   PROT 8.2 (H) 08/11/2018   ALBUMIN 4.3 08/11/2018   CALCIUM 9.3 08/24/2018   ANIONGAP 10 08/24/2018   Lab Results  Component Value Date   CHOL 220 (H) 05/22/2019   Lab Results  Component Value Date   HDL 58 05/22/2019   Lab Results  Component Value Date   LDLCALC 147 (H) 05/22/2019   Lab Results  Component Value Date   TRIG 84 05/22/2019   Lab Results  Component Value Date   CHOLHDL 3.8 05/22/2019   Lab Results  Component Value Date   HGBA1C 5.1 02/20/2019      Assessment & Plan:    1. Essential hypertension - Her blood pressure is elevated and she will start Amlodipine, was educated on medication side effects and was advised to notify clinic. She will check blood pressure daily, record and bring log to follow up appointment. Her gaol blood pressure should be less than 140/90 and normal is less than 120/80. She was advised to continue on DASH diet and exercise  as tolerated. - amLODipine (NORVASC) 5 MG tablet; Take 1 tablet (5 mg total) by mouth daily.  Dispense: 30 tablet; Refill: 0  2. Elevated lipids - Her ASCVD risk was 5%, she was advised to continue on low fat/low cholesterol diet , exercise as tolerated and will recheck - Lipid panel in 3 months.  3. Elbow pain, chronic, right - She was advised to continue on Tylenol as needed and to notify clinic for worsening pain.  4. Health care maintenance  - Ambulatory referral to Hematology / Oncology for Mammogram and Pap smear.  5. History of cerebral venous sinus thrombosis - She will continue on Xarelto and follow up with Dr Smith Robert.    Follow-up: Return in about 4 weeks (around 06/25/2019).    Estephania Licciardi Trellis Paganini, NP

## 2019-06-25 ENCOUNTER — Telehealth: Payer: Self-pay

## 2019-06-25 ENCOUNTER — Ambulatory Visit: Payer: Self-pay | Admitting: Gerontology

## 2019-06-25 ENCOUNTER — Other Ambulatory Visit: Payer: Self-pay

## 2019-06-25 DIAGNOSIS — I10 Essential (primary) hypertension: Secondary | ICD-10-CM

## 2019-06-25 MED ORDER — AMLODIPINE BESYLATE 5 MG PO TABS: 5.0000 mg | ORAL_TABLET | Freq: Every day | ORAL | 3 refills | Status: DC

## 2019-06-25 NOTE — Telephone Encounter (Signed)
Called pt at 2:24 pm on 06/25/19 and LVM with appt details. Told pt to call back with any questions or concerns - NU

## 2019-06-25 NOTE — Patient Instructions (Addendum)
Managing Your Hypertension Hypertension is commonly called high blood pressure. This is when the force of your blood pressing against the walls of your arteries is too strong. Arteries are blood vessels that carry blood from your heart throughout your body. Hypertension forces the heart to work harder to pump blood, and may cause the arteries to become narrow or stiff. Having untreated or uncontrolled hypertension can cause heart attack, stroke, kidney disease, and other problems. What are blood pressure readings? A blood pressure reading consists of a higher number over a lower number. Ideally, your blood pressure should be below 120/80. The first ("top") number is called the systolic pressure. It is a measure of the pressure in your arteries as your heart beats. The second ("bottom") number is called the diastolic pressure. It is a measure of the pressure in your arteries as the heart relaxes. What does my blood pressure reading mean? Blood pressure is classified into four stages. Based on your blood pressure reading, your health care provider may use the following stages to determine what type of treatment you need, if any. Systolic pressure and diastolic pressure are measured in a unit called mm Hg. Normal  Systolic pressure: below 120.  Diastolic pressure: below 80. Elevated  Systolic pressure: 120-129.  Diastolic pressure: below 80. Hypertension stage 1  Systolic pressure: 130-139.  Diastolic pressure: 80-89. Hypertension stage 2  Systolic pressure: 140 or above.  Diastolic pressure: 90 or above. What health risks are associated with hypertension? Managing your hypertension is an important responsibility. Uncontrolled hypertension can lead to:  A heart attack.  A stroke.  A weakened blood vessel (aneurysm).  Heart failure.  Kidney damage.  Eye damage.  Metabolic syndrome.  Memory and concentration problems. What changes can I make to manage my  hypertension? Hypertension can be managed by making lifestyle changes and possibly by taking medicines. Your health care provider will help you make a plan to bring your blood pressure within a normal range. Eating and drinking   Eat a diet that is high in fiber and potassium, and low in salt (sodium), added sugar, and fat. An example eating plan is called the DASH (Dietary Approaches to Stop Hypertension) diet. To eat this way: ? Eat plenty of fresh fruits and vegetables. Try to fill half of your plate at each meal with fruits and vegetables. ? Eat whole grains, such as whole wheat pasta, brown rice, or whole grain bread. Fill about one quarter of your plate with whole grains. ? Eat low-fat diary products. ? Avoid fatty cuts of meat, processed or cured meats, and poultry with skin. Fill about one quarter of your plate with lean proteins such as fish, chicken without skin, beans, eggs, and tofu. ? Avoid premade and processed foods. These tend to be higher in sodium, added sugar, and fat.  Reduce your daily sodium intake. Most people with hypertension should eat less than 1,500 mg of sodium a day.  Limit alcohol intake to no more than 1 drink a day for nonpregnant women and 2 drinks a day for men. One drink equals 12 oz of beer, 5 oz of wine, or 1 oz of hard liquor. Lifestyle  Work with your health care provider to maintain a healthy body weight, or to lose weight. Ask what an ideal weight is for you.  Get at least 30 minutes of exercise that causes your heart to beat faster (aerobic exercise) most days of the week. Activities may include walking, swimming, or biking.  Include exercise   to strengthen your muscles (resistance exercise), such as weight lifting, as part of your weekly exercise routine. Try to do these types of exercises for 30 minutes at least 3 days a week.  Do not use any products that contain nicotine or tobacco, such as cigarettes and e-cigarettes. If you need help quitting,  ask your health care provider.  Control any long-term (chronic) conditions you have, such as high cholesterol or diabetes. Monitoring  Monitor your blood pressure at home as told by your health care provider. Your personal target blood pressure may vary depending on your medical conditions, your age, and other factors.  Have your blood pressure checked regularly, as often as told by your health care provider. Working with your health care provider  Review all the medicines you take with your health care provider because there may be side effects or interactions.  Talk with your health care provider about your diet, exercise habits, and other lifestyle factors that may be contributing to hypertension.  Visit your health care provider regularly. Your health care provider can help you create and adjust your plan for managing hypertension. Will I need medicine to control my blood pressure? Your health care provider may prescribe medicine if lifestyle changes are not enough to get your blood pressure under control, and if:  Your systolic blood pressure is 130 or higher.  Your diastolic blood pressure is 80 or higher. Take medicines only as told by your health care provider. Follow the directions carefully. Blood pressure medicines must be taken as prescribed. The medicine does not work as well when you skip doses. Skipping doses also puts you at risk for problems. Contact a health care provider if:  You think you are having a reaction to medicines you have taken.  You have repeated (recurrent) headaches.  You feel dizzy.  You have swelling in your ankles.  You have trouble with your vision. Get help right away if:  You develop a severe headache or confusion.  You have unusual weakness or numbness, or you feel faint.  You have severe pain in your chest or abdomen.  You vomit repeatedly.  You have trouble breathing. Summary  Hypertension is when the force of blood pumping  through your arteries is too strong. If this condition is not controlled, it may put you at risk for serious complications.  Your personal target blood pressure may vary depending on your medical conditions, your age, and other factors. For most people, a normal blood pressure is less than 120/80.  Hypertension is managed by lifestyle changes, medicines, or both. Lifestyle changes include weight loss, eating a healthy, low-sodium diet, exercising more, and limiting alcohol. This information is not intended to replace advice given to you by your health care provider. Make sure you discuss any questions you have with your health care provider. Document Revised: 05/25/2018 Document Reviewed: 12/30/2015 Elsevier Patient Education  [place].  Managing Your Hypertension Hypertension is commonly called high blood pressure. This is when the force of your blood pressing against the walls of your arteries is too strong. Arteries are blood vessels that carry blood from your heart throughout your body. Hypertension forces the heart to work harder to pump blood, and may cause the arteries to become narrow or stiff. Having untreated or uncontrolled hypertension can cause heart attack, stroke, kidney disease, and other problems. What are blood pressure readings? A blood pressure reading consists of a higher number over a lower number. Ideally, your blood pressure should be below 120/80.  The first ("top") number is called the systolic pressure. It is a measure of the pressure in your arteries as your heart beats. The second ("bottom") number is called the diastolic pressure. It is a measure of the pressure in your arteries as the heart relaxes. What does my blood pressure reading mean? Blood pressure is classified into four stages. Based on your blood pressure reading, your health care provider may use the following stages to determine what type of treatment you need, if any. Systolic pressure and diastolic  pressure are measured in a unit called mm Hg. Normal  Systolic pressure: below 123456.  Diastolic pressure: below 80. Elevated  Systolic pressure: [id number].  Diastolic pressure: below 80. Hypertension stage 1  Systolic pressure: 0000000.  Diastolic pressure: [id number]. Hypertension stage 2  Systolic pressure: [id number] or above.  Diastolic pressure: 90 or above. What health risks are associated with hypertension? Managing your hypertension is an important responsibility. Uncontrolled hypertension can lead to:  A heart attack.  A stroke.  A weakened blood vessel (aneurysm).  Heart failure.  Kidney damage.  Eye damage.  Metabolic syndrome.  Memory and concentration problems. What changes can I make to manage my hypertension? Hypertension can be managed by making lifestyle changes and possibly by taking medicines. Your health care provider will help you make a plan to bring your blood pressure within a normal range. Eating and drinking   Eat a diet that is high in fiber and potassium, and low in salt (sodium), added sugar, and fat. An example eating plan is called the DASH (Dietary Approaches to Stop Hypertension) diet. To eat this way: ? Eat plenty of fresh fruits and vegetables. Try to fill half of your plate at each meal with fruits and vegetables. ? Eat whole grains, such as whole wheat pasta, brown rice, or whole grain bread. Fill about one quarter of your plate with whole grains. ? Eat low-fat diary products. ? Avoid fatty cuts of meat, processed or cured meats, and poultry with skin. Fill about one quarter of your plate with lean proteins such as fish, chicken without skin, beans, eggs, and tofu. ? Avoid premade and processed foods. These tend to be higher in sodium, added sugar, and fat.  Reduce your daily sodium intake. Most people with hypertension should eat less than 1,500 mg of sodium a day.  Limit alcohol intake to no more than 1 drink a day for nonpregnant women and  2 drinks a day for men. One drink equals 12 oz of beer, 5 oz of wine, or 1 oz of hard liquor. Lifestyle  Work with your health care provider to maintain a healthy body weight, or to lose weight. Ask what an ideal weight is for you.  Get at least 30 minutes of exercise that causes your heart to beat faster (aerobic exercise) most days of the week. Activities may include walking, swimming, or biking.  Include exercise to strengthen your muscles (resistance exercise), such as weight lifting, as part of your weekly exercise routine. Try to do these types of exercises for 30 minutes at least 3 days a week.  Do not use any products that contain nicotine or tobacco, such as cigarettes and e-cigarettes. If you need help quitting, ask your health care provider.  Control any long-term (chronic) conditions you have, such as high cholesterol or diabetes. Monitoring  Monitor your blood pressure at home as told by your health care provider. Your personal target blood pressure may vary depending on your medical conditions, your  age, and other factors.  Have your blood pressure checked regularly, as often as told by your health care provider. Working with your health care provider  Review all the medicines you take with your health care provider because there may be side effects or interactions.  Talk with your health care provider about your diet, exercise habits, and other lifestyle factors that may be contributing to hypertension.  Visit your health care provider regularly. Your health care provider can help you create and adjust your plan for managing hypertension. Will I need medicine to control my blood pressure? Your health care provider may prescribe medicine if lifestyle changes are not enough to get your blood pressure under control, and if:  Your systolic blood pressure is 130 or higher.  Your diastolic blood pressure is 80 or higher. Take medicines only as told by your health care  provider. Follow the directions carefully. Blood pressure medicines must be taken as prescribed. The medicine does not work as well when you skip doses. Skipping doses also puts you at risk for problems. Contact a health care provider if:  You think you are having a reaction to medicines you have taken.  You have repeated (recurrent) headaches.  You feel dizzy.  You have swelling in your ankles.  You have trouble with your vision. Get help right away if:  You develop a severe headache or confusion.  You have unusual weakness or numbness, or you feel faint.  You have severe pain in your chest or abdomen.  You vomit repeatedly.  You have trouble breathing. Summary  Hypertension is when the force of blood pumping through your arteries is too strong. If this condition is not controlled, it may put you at risk for serious complications.  Your personal target blood pressure may vary depending on your medical conditions, your age, and other factors. For most people, a normal blood pressure is less than 120/80.  Hypertension is managed by lifestyle changes, medicines, or both. Lifestyle changes include weight loss, eating a healthy, low-sodium diet, exercising more, and limiting alcohol. This information is not intended to replace advice given to you by your health care provider. Make sure you discuss any questions you have with your health care provider. Document Revised: 05/25/2018 Document Reviewed: 12/30/2015 Elsevier Patient Education  2020 [employer].

## 2019-06-25 NOTE — Progress Notes (Signed)
Established Patient Office Visit  Subjective:  Patient ID: Jasmine [name], female    DOB: 02-15-1960  Age: 59 y.o. MRN: [id number]  CC:  Chief Complaint  Patient presents with  . Hypertension   Patient consents to telephone visit and 2 patient identifiers was used to identify patient.  HPI Jasmine [name] presents for follow up of hypertension. She was started on 5 mg Amlodipine daily during her last visit and she states that she's compliant with her medication. She states that she checks her blood pressure daily and it runs in the low 120's/70's, continues on DASH diet and exercises as tolerated. She denies peripheral edema,light headedness, chest pain and palpitation. Overall, she states that she's doing well and offers no further complaint.  Past Medical History:  Diagnosis Date  . Anemia   . Arthritis    LEFT SHOULDER  . H/O cerebral venous sinus thrombosis 2013  . History of hiatal hernia   . History of kidney stones     Past Surgical History:  Procedure Laterality Date  . COLONOSCOPY WITH PROPOFOL N/A 07/02/2018   Procedure: COLONOSCOPY WITH PROPOFOL;  Surgeon: Pasty Spillers, MD;  Location: [facility] ENDOSCOPY;  Service: Endoscopy;  Laterality: N/A;  . ENTEROSCOPY N/A 07/17/2018   Procedure: ENTEROSCOPY;  Surgeon: Wyline Mood, MD;  Location: [facility] ENDOSCOPY;  Service: Gastroenterology;  Laterality: N/A;  . ESOPHAGOGASTRODUODENOSCOPY N/A 2001   approximately- for Reflux  . ESOPHAGOGASTRODUODENOSCOPY (EGD) WITH PROPOFOL N/A 07/02/2018   Procedure: ESOPHAGOGASTRODUODENOSCOPY (EGD) WITH PROPOFOL;  Surgeon: Pasty Spillers, MD;  Location: [facility] ENDOSCOPY;  Service: Endoscopy;  Laterality: N/A;  . ESOPHAGOGASTRODUODENOSCOPY (EGD) WITH PROPOFOL N/A 07/11/2018   Procedure: ESOPHAGOGASTRODUODENOSCOPY (EGD) WITH PROPOFOL;  Surgeon: Sherrilyn Rist, MD;  Location: [facility] ENDOSCOPY;  Service: Gastroenterology;  Laterality: N/A;  PT TO HAVE CAPSULE PLACEMENT  . GIVENS CAPSULE STUDY N/A  07/11/2018   Procedure: GIVENS CAPSULE STUDY;  Surgeon: Sherrilyn Rist, MD;  Location: [facility] ENDOSCOPY;  Service: Gastroenterology;  Laterality: N/A;  . HIATAL HERNIA REPAIR  08/23/2018   XI ROBOTIC ASSISTED HIATAL HERNIA REPAIR  WITH FUNDOPLICATION    Family History  Problem Relation Age of Onset  . Breast cancer Mother   . Bone cancer Mother   . Obesity Father     Social History   Socioeconomic History  . Marital status: Single    Spouse name: Not on file  . Number of children: Not on file  . Years of education: Not on file  . Highest education level: Not on file  Occupational History  . Not on file  Tobacco Use  . Smoking status: Never Smoker  . Smokeless tobacco: Never Used  Substance and Sexual Activity  . Alcohol use: Yes    Comment: occasional  . Drug use: Yes    Types: Marijuana  . Sexual activity: Not on file  Other Topics Concern  . Not on file  Social History Narrative   Lives at home with her parents.  Independent at baseline.   Social Determinants of Health   Financial Resource Strain:   . Difficulty of Paying Living Expenses:   Food Insecurity:   . Worried About Programme researcher, broadcasting/film/video in the Last Year:   . Barista in the Last Year:   Transportation Needs:   . Freight forwarder (Medical):   Marland Kitchen Lack of Transportation (Non-Medical):   Physical Activity:   . Days of Exercise per Week:   . Minutes of Exercise per Session:  Stress:   . Feeling of Stress :   Social Connections:   . Frequency of Communication with Friends and Family:   . Frequency of Social Gatherings with Friends and Family:   . Attends Religious Services:   . Active Member of Clubs or Organizations:   . Attends Banker Meetings:   Marland Kitchen Marital Status:   Intimate Partner Violence:   . Fear of Current or Ex-Partner:   . Emotionally Abused:   Marland Kitchen Physically Abused:   . Sexually Abused:     Outpatient Medications Prior to Visit  Medication Sig Dispense Refill   . amLODipine (NORVASC) 5 MG tablet Take 1 tablet (5 mg total) by mouth daily. 30 tablet 0  . rivaroxaban (XARELTO) 20 MG TABS tablet Take 1 tablet (20 mg total) by mouth daily at 8 pm. 90 tablet 0  . vitamin B-12 (CYANOCOBALAMIN) 500 MCG tablet Take 500 mcg by mouth daily.     Facility-Administered Medications Prior to Visit  Medication Dose Route Frequency Provider Last Rate Last Admin  . cyanocobalamin ((VITAMIN B-12)) injection 1,000 mcg  1,000 mcg Intramuscular Q30 days Creig Hines, MD   1,000 mcg at 10/09/18 1116    Allergies  Allergen Reactions  . Food Other (See Comments)    Pumpkins/squash/zucchini (gourds foods) swelling of the face  . Tape Itching, Dermatitis, Rash and Other (See Comments)    Transpore tape  . Povidone Iodine   . Codeine Other (See Comments)    Crawling skin  . Shellfish Allergy Other (See Comments)    Crawling skin/stomach cramps.    ROS Review of Systems  Constitutional: Negative.   Respiratory: Negative.   Cardiovascular: Negative.   Neurological: Negative.   Hematological: Negative.       Objective:    Physical Exam No physical exam was done There were no vitals taken for this visit. Wt Readings from Last 3 Encounters:  05/28/19 177 lb (80.3 kg)  05/22/19 177 lb 12.8 oz (80.6 kg)  04/12/19 176 lb (79.8 kg)     Health Maintenance Due  Topic Date Due  . Hepatitis C Screening  Never done  . COVID-19 Vaccine (1) Never done  . TETANUS/TDAP  Never done  . PAP SMEAR-Modifier  Never done  . MAMMOGRAM  Never done    There are no preventive care reminders to display for this patient.  Lab Results  Component Value Date   TSH 2.300 02/20/2019   Lab Results  Component Value Date   WBC 5.9 05/22/2019   HGB 14.8 05/22/2019   HCT 44.0 05/22/2019   MCV 91 05/22/2019   PLT 258 05/22/2019   Lab Results  Component Value Date   NA 139 08/24/2018   K 4.4 08/24/2018   CO2 25 08/24/2018   GLUCOSE 125 (H) 08/24/2018   BUN 9  08/24/2018   CREATININE 0.61 08/24/2018   BILITOT 1.6 (H) 08/11/2018   ALKPHOS 79 08/11/2018   AST 35 08/11/2018   ALT 11 08/11/2018   PROT 8.2 (H) 08/11/2018   ALBUMIN 4.3 08/11/2018   CALCIUM 9.3 08/24/2018   ANIONGAP 10 08/24/2018   Lab Results  Component Value Date   CHOL 220 (H) 05/22/2019   Lab Results  Component Value Date   HDL 58 05/22/2019   Lab Results  Component Value Date   LDLCALC 147 (H) 05/22/2019   Lab Results  Component Value Date   TRIG 84 05/22/2019   Lab Results  Component Value Date   CHOLHDL 3.8  05/22/2019   Lab Results  Component Value Date   HGBA1C 5.1 02/20/2019      Assessment & Plan:    1. Essential hypertension - Per patient her blood is usually less than 130/80, she will continue on current treatment regimen. She was advised to check her blood pressure , record and bring log to follow up appointment. She will continue on DASH diet and exercise as tolerated. - amLODipine (NORVASC) 5 MG tablet; Take 1 tablet (5 mg total) by mouth daily.  Dispense: 30 tablet; Refill: 3   Follow-up: Return in about 9 weeks (around 08/27/2019), or if symptoms worsen or fail to improve.    Dashun Borre Jerold Coombe, NP

## 2019-08-09 ENCOUNTER — Other Ambulatory Visit: Payer: Self-pay | Admitting: *Deleted

## 2019-08-09 ENCOUNTER — Inpatient Hospital Stay: Payer: Self-pay | Attending: Oncology

## 2019-08-09 ENCOUNTER — Other Ambulatory Visit: Payer: Self-pay

## 2019-08-09 DIAGNOSIS — D509 Iron deficiency anemia, unspecified: Secondary | ICD-10-CM | POA: Insufficient documentation

## 2019-08-09 LAB — CBC WITH DIFFERENTIAL/PLATELET
Abs Immature Granulocytes: 0.04 10*3/uL (ref 0.00–0.07)
Basophils Absolute: 0.1 10*3/uL (ref 0.0–0.1)
Basophils Relative: 1 %
Eosinophils Absolute: 0.3 10*3/uL (ref 0.0–0.5)
Eosinophils Relative: 3 %
HCT: 45.9 % (ref 36.0–46.0)
Hemoglobin: 15.5 g/dL — ABNORMAL HIGH (ref 12.0–15.0)
Immature Granulocytes: 1 %
Lymphocytes Relative: 17 %
Lymphs Abs: 1.4 10*3/uL (ref 0.7–4.0)
MCH: 31.3 pg (ref 26.0–34.0)
MCHC: 33.8 g/dL (ref 30.0–36.0)
MCV: 92.7 fL (ref 80.0–100.0)
Monocytes Absolute: 0.3 10*3/uL (ref 0.1–1.0)
Monocytes Relative: 4 %
Neutro Abs: 5.9 10*3/uL (ref 1.7–7.7)
Neutrophils Relative %: 74 %
Platelets: 285 10*3/uL (ref 150–400)
RBC: 4.95 MIL/uL (ref 3.87–5.11)
RDW: 13.3 % (ref 11.5–15.5)
WBC: 7.9 10*3/uL (ref 4.0–10.5)
nRBC: 0 % (ref 0.0–0.2)

## 2019-08-09 LAB — IRON AND TIBC
Iron: 74 ug/dL (ref 28–170)
Saturation Ratios: 15 % (ref 10.4–31.8)
TIBC: 494 ug/dL — ABNORMAL HIGH (ref 250–450)
UIBC: 420 ug/dL

## 2019-08-09 LAB — FERRITIN: Ferritin: 16 ng/mL (ref 11–307)

## 2019-08-16 ENCOUNTER — Other Ambulatory Visit: Payer: Self-pay

## 2019-08-16 ENCOUNTER — Other Ambulatory Visit: Payer: Self-pay | Admitting: Oncology

## 2019-08-16 ENCOUNTER — Encounter: Payer: Self-pay | Admitting: Oncology

## 2019-08-16 MED ORDER — RIVAROXABAN 20 MG PO TABS: 20.0000 mg | ORAL_TABLET | Freq: Every day | ORAL | 3 refills | Status: DC

## 2019-08-16 MED ORDER — RIVAROXABAN 20 MG PO TABS: 20.0000 mg | ORAL_TABLET | Freq: Every day | ORAL | 0 refills | Status: DC

## 2019-08-21 ENCOUNTER — Other Ambulatory Visit: Payer: Self-pay

## 2019-08-27 ENCOUNTER — Ambulatory Visit: Payer: Self-pay | Admitting: Gerontology

## 2019-08-28 ENCOUNTER — Other Ambulatory Visit: Payer: Self-pay

## 2019-08-28 DIAGNOSIS — E785 Hyperlipidemia, unspecified: Secondary | ICD-10-CM

## 2019-08-31 LAB — LIPID PANEL
Chol/HDL Ratio: 3.2 ratio (ref 0.0–4.4)
Cholesterol, Total: 234 mg/dL — ABNORMAL HIGH (ref 100–199)
HDL: 74 mg/dL (ref >39)
LDL Chol Calc (NIH): 146 mg/dL — ABNORMAL HIGH (ref 0–99)
Triglycerides: 82 mg/dL (ref 0–149)
VLDL Cholesterol Cal: 14 mg/dL (ref 5–40)

## 2019-09-03 ENCOUNTER — Ambulatory Visit: Payer: Self-pay | Admitting: Gerontology

## 2019-09-10 ENCOUNTER — Ambulatory Visit: Payer: Self-pay | Admitting: Gerontology

## 2019-09-10 ENCOUNTER — Other Ambulatory Visit: Payer: Self-pay

## 2019-09-10 DIAGNOSIS — E785 Hyperlipidemia, unspecified: Secondary | ICD-10-CM

## 2019-09-10 DIAGNOSIS — I10 Essential (primary) hypertension: Secondary | ICD-10-CM

## 2019-09-10 NOTE — Progress Notes (Signed)
Established Patient Office Visit  Subjective:  Patient ID: Jasmine [name], female    DOB: [birth year]  Age: 59 y.o. MRN: [id number]  CC: No chief complaint on file.  Patient consents to telephone visit and 2 patient identifiers was used to identify patient.  HPI Tempest Frankland presents for follow up of hypertension and lab review. She states that she's compliant with her medication, adheres to DASH diet and exercises as tolerated. She states that she checks her blood pressure at home and her last reading was 118/67. She continues to take 20 mg Rivaroxaban for history of venous sinus thrombosis, denies bruises, hematuria, hematochezia and active bleeding. Her Lipid panel done on 08/28/2019, total cholesterol increased from 220 mg/dl to 563 andLDL decreased from 147 mg/dl to 893 mg/dl. She states that she has not scheduled her Mammogram and Pap smear. Overall, she states that she's doing well and offers no further complaint.  Past Medical History:  Diagnosis Date  . Anemia   . Arthritis    LEFT SHOULDER  . H/O cerebral venous sinus thrombosis 2013  . History of hiatal hernia   . History of kidney stones     Past Surgical History:  Procedure Laterality Date  . COLONOSCOPY WITH PROPOFOL N/A 07/02/2018   Procedure: COLONOSCOPY WITH PROPOFOL;  Surgeon: Pasty Spillers, MD;  Location: [facility] ENDOSCOPY;  Service: Endoscopy;  Laterality: N/A;  . ENTEROSCOPY N/A 07/17/2018   Procedure: ENTEROSCOPY;  Surgeon: Wyline Mood, MD;  Location: [facility] ENDOSCOPY;  Service: Gastroenterology;  Laterality: N/A;  . ESOPHAGOGASTRODUODENOSCOPY N/A 2001   approximately- for Reflux  . ESOPHAGOGASTRODUODENOSCOPY (EGD) WITH PROPOFOL N/A 07/02/2018   Procedure: ESOPHAGOGASTRODUODENOSCOPY (EGD) WITH PROPOFOL;  Surgeon: Pasty Spillers, MD;  Location: [facility] ENDOSCOPY;  Service: Endoscopy;  Laterality: N/A;  . ESOPHAGOGASTRODUODENOSCOPY (EGD) WITH PROPOFOL N/A 07/11/2018   Procedure: ESOPHAGOGASTRODUODENOSCOPY (EGD) WITH  PROPOFOL;  Surgeon: Sherrilyn Rist, MD;  Location: [facility] ENDOSCOPY;  Service: Gastroenterology;  Laterality: N/A;  PT TO HAVE CAPSULE PLACEMENT  . GIVENS CAPSULE STUDY N/A 07/11/2018   Procedure: GIVENS CAPSULE STUDY;  Surgeon: Sherrilyn Rist, MD;  Location: [facility] ENDOSCOPY;  Service: Gastroenterology;  Laterality: N/A;  . HIATAL HERNIA REPAIR  08/23/2018   XI ROBOTIC ASSISTED HIATAL HERNIA REPAIR  WITH FUNDOPLICATION    Family History  Problem Relation Age of Onset  . Breast cancer Mother   . Bone cancer Mother   . Obesity Father     Social History   Socioeconomic History  . Marital status: Single    Spouse name: Not on file  . Number of children: Not on file  . Years of education: Not on file  . Highest education level: Not on file  Occupational History  . Not on file  Tobacco Use  . Smoking status: Never Smoker  . Smokeless tobacco: Never Used  Vaping Use  . Vaping Use: Never used  Substance and Sexual Activity  . Alcohol use: Yes    Comment: occasional  . Drug use: Yes    Types: Marijuana  . Sexual activity: Not on file  Other Topics Concern  . Not on file  Social History Narrative   Lives at home with her parents.  Independent at baseline.   Social Determinants of Health   Financial Resource Strain:   . Difficulty of Paying Living Expenses:   Food Insecurity:   . Worried About Programme researcher, broadcasting/film/video in the Last Year:   . [employer] of Food in the Last Year:  Transportation Needs:   . Freight forwarder (Medical):   Marland Kitchen Lack of Transportation (Non-Medical):   Physical Activity:   . Days of Exercise per Week:   . Minutes of Exercise per Session:   Stress:   . Feeling of Stress :   Social Connections:   . Frequency of Communication with Friends and Family:   . Frequency of Social Gatherings with Friends and Family:   . Attends Religious Services:   . Active Member of Clubs or Organizations:   . Attends Banker Meetings:   Marland Kitchen Marital Status:    Intimate Partner Violence:   . Fear of Current or Ex-Partner:   . Emotionally Abused:   Marland Kitchen Physically Abused:   . Sexually Abused:     Outpatient Medications Prior to Visit  Medication Sig Dispense Refill  . amLODipine (NORVASC) 5 MG tablet Take 1 tablet (5 mg total) by mouth daily. 30 tablet 3  . rivaroxaban (XARELTO) 20 MG TABS tablet Take 1 tablet (20 mg total) by mouth daily at 8 pm. 90 tablet 3  . vitamin B-12 (CYANOCOBALAMIN) 500 MCG tablet Take 500 mcg by mouth daily.     Facility-Administered Medications Prior to Visit  Medication Dose Route Frequency Provider Last Rate Last Admin  . cyanocobalamin ((VITAMIN B-12)) injection 1,000 mcg  1,000 mcg Intramuscular Q30 days Creig Hines, MD   1,000 mcg at 10/09/18 1116    Allergies  Allergen Reactions  . Food Other (See Comments)    Pumpkins/squash/zucchini (gourds foods) swelling of the face  . Tape Itching, Dermatitis, Rash and Other (See Comments)    Transpore tape  . Povidone Iodine   . Codeine Other (See Comments)    Crawling skin  . Shellfish Allergy Other (See Comments)    Crawling skin/stomach cramps.    ROS Review of Systems  Constitutional: Negative.   Eyes: Negative.   Respiratory: Negative.   Cardiovascular: Negative.   Neurological: Negative.   Hematological: Negative.   Psychiatric/Behavioral: Negative.       Objective:    Physical Exam No physical exam was done There were no vitals taken for this visit. Wt Readings from Last 3 Encounters:  08/28/19 173 lb 14.4 oz (78.9 kg)  05/28/19 177 lb (80.3 kg)  05/22/19 177 lb 12.8 oz (80.6 kg)   She lost 4 pounds in 3 months and was encouraged to continue on her weight loss regimen.  Health Maintenance Due  Topic Date Due  . Hepatitis C Screening  Never done  . COVID-19 Vaccine (1) Never done  . TETANUS/TDAP  Never done  . PAP SMEAR-Modifier  Never done  . MAMMOGRAM  Never done    There are no preventive care reminders to display for this  patient.  Lab Results  Component Value Date   TSH 2.300 02/20/2019   Lab Results  Component Value Date   WBC 7.9 08/09/2019   HGB 15.5 (H) 08/09/2019   HCT 45.9 08/09/2019   MCV 92.7 08/09/2019   PLT 285 08/09/2019   Lab Results  Component Value Date   NA 139 08/24/2018   K 4.4 08/24/2018   CO2 25 08/24/2018   GLUCOSE 125 (H) 08/24/2018   BUN 9 08/24/2018   CREATININE 0.61 08/24/2018   BILITOT 1.6 (H) 08/11/2018   ALKPHOS 79 08/11/2018   AST 35 08/11/2018   ALT 11 08/11/2018   PROT 8.2 (H) 08/11/2018   ALBUMIN 4.3 08/11/2018   CALCIUM 9.3 08/24/2018   ANIONGAP 10 08/24/2018  Lab Results  Component Value Date   CHOL 234 (H) 08/28/2019   Lab Results  Component Value Date   HDL 74 08/28/2019   Lab Results  Component Value Date   LDLCALC 146 (H) 08/28/2019   Lab Results  Component Value Date   TRIG 82 08/28/2019   Lab Results  Component Value Date   CHOLHDL 3.2 08/28/2019   Lab Results  Component Value Date   HGBA1C 5.1 02/20/2019      Assessment & Plan:    1. Essential hypertension - Per patient her blood pressure is under control, she will continue on current treatment regimen. - She was advised to continue on DASH diet and exercise as tolerated.  2. Elevated lipids The 10-year ASCVD risk score Denman George DC Jr., et al., 2013) is: 4%   Values used to calculate the score:     Age: 75 years     Sex: Female     Is Non-Hispanic African American: No     Diabetic: No     Tobacco smoker: No     Systolic Blood Pressure: 149 mmHg     Is BP treated: Yes     HDL Cholesterol: 74 mg/dL     Total Cholesterol: 234 mg/dL  -She was advised to continue on Low fat Diet, like low fat dairy products eg skimmed milk -Avoid any fried food -Regular exercise/walk -Goal for Total Cholesterol is less than 200 -Goal for bad cholesterol LDL is less than 70 -Goal for Good cholesterol HDL is more than 45 -Goal for Triglyceride is less than 150     Follow-up:  01/23/2020 or if symptoms worsen or fail to improve.03/26/2019   Nehemie Casserly Trellis Paganini, NP

## 2019-09-10 NOTE — Patient Instructions (Addendum)
DASH Eating Plan DASH stands for "Dietary Approaches to Stop Hypertension." The DASH eating plan is a healthy eating plan that has been shown to reduce high blood pressure (hypertension). It may also reduce your risk for type 2 diabetes, heart disease, and stroke. The DASH eating plan may also help with weight loss. What are tips for following this plan?  General guidelines  Avoid eating more than 2,300 mg (milligrams) of salt (sodium) a day. If you have hypertension, you may need to reduce your sodium intake to 1,500 mg a day.  Limit alcohol intake to no more than 1 drink a day for nonpregnant women and 2 drinks a day for men. One drink equals 12 oz of beer, 5 oz of wine, or 1 oz of hard liquor.  Work with your health care provider to maintain a healthy body weight or to lose weight. Ask what an ideal weight is for you.  Get at least 30 minutes of exercise that causes your heart to beat faster (aerobic exercise) most days of the week. Activities may include walking, swimming, or biking.  Work with your health care provider or diet and nutrition specialist (dietitian) to adjust your eating plan to your individual calorie needs. Reading food labels   Check food labels for the amount of sodium per serving. Choose foods with less than 5 percent of the Daily Value of sodium. Generally, foods with less than 300 mg of sodium per serving fit into this eating plan.  To find whole grains, look for the word "whole" as the first word in the ingredient list. Shopping  Buy products labeled as "low-sodium" or "no salt added."  Buy fresh foods. Avoid canned foods and premade or frozen meals. Cooking  Avoid adding salt when cooking. Use salt-free seasonings or herbs instead of table salt or sea salt. Check with your health care provider or pharmacist before using salt substitutes.  Do not fry foods. Cook foods using healthy methods such as baking, boiling, grilling, and broiling instead.  Cook with  heart-healthy oils, such as olive, canola, soybean, or sunflower oil. Meal planning  Eat a balanced diet that includes: ? 5 or more servings of fruits and vegetables each day. At each meal, try to fill half of your plate with fruits and vegetables. ? Up to 6-8 servings of whole grains each day. ? Less than 6 oz of lean meat, poultry, or fish each day. A 3-oz serving of meat is about the same size as a deck of cards. One egg equals 1 oz. ? 2 servings of low-fat dairy each day. ? A serving of nuts, seeds, or beans 5 times each week. ? Heart-healthy fats. Healthy fats called Omega-3 fatty acids are found in foods such as flaxseeds and coldwater fish, like sardines, salmon, and mackerel.  Limit how much you eat of the following: ? Canned or prepackaged foods. ? Food that is high in trans fat, such as fried foods. ? Food that is high in saturated fat, such as fatty meat. ? Sweets, desserts, sugary drinks, and other foods with added sugar. ? Full-fat dairy products.  Do not salt foods before eating.  Try to eat at least 2 vegetarian meals each week.  Eat more home-cooked food and less restaurant, buffet, and fast food.  When eating at a restaurant, ask that your food be prepared with less salt or no salt, if possible. What foods are recommended? The items listed may not be a complete list. Talk with your dietitian about   what dietary choices are best for you. Grains Whole-grain or whole-wheat bread. Whole-grain or whole-wheat pasta. Brown rice. Oatmeal. Quinoa. Bulgur. Whole-grain and low-sodium cereals. Pita bread. Low-fat, low-sodium crackers. Whole-wheat flour tortillas. Vegetables Fresh or frozen vegetables (raw, steamed, roasted, or grilled). Low-sodium or reduced-sodium tomato and vegetable juice. Low-sodium or reduced-sodium tomato sauce and tomato paste. Low-sodium or reduced-sodium canned vegetables. Fruits All fresh, dried, or frozen fruit. Canned fruit in natural juice (without  added sugar). Meat and other protein foods Skinless chicken or turkey. Ground chicken or turkey. Pork with fat trimmed off. Fish and seafood. Egg whites. Dried beans, peas, or lentils. Unsalted nuts, nut butters, and seeds. Unsalted canned beans. Lean cuts of beef with fat trimmed off. Low-sodium, lean deli meat. Dairy Low-fat (1%) or fat-free (skim) milk. Fat-free, low-fat, or reduced-fat cheeses. Nonfat, low-sodium ricotta or cottage cheese. Low-fat or nonfat yogurt. Low-fat, low-sodium cheese. Fats and oils Soft margarine without trans fats. Vegetable oil. Low-fat, reduced-fat, or light mayonnaise and salad dressings (reduced-sodium). Canola, safflower, olive, soybean, and sunflower oils. Avocado. Seasoning and other foods Herbs. Spices. Seasoning mixes without salt. Unsalted popcorn and pretzels. Fat-free sweets. What foods are not recommended? The items listed may not be a complete list. Talk with your dietitian about what dietary choices are best for you. Grains Baked goods made with fat, such as croissants, muffins, or some breads. Dry pasta or rice meal packs. Vegetables Creamed or fried vegetables. Vegetables in a cheese sauce. Regular canned vegetables (not low-sodium or reduced-sodium). Regular canned tomato sauce and paste (not low-sodium or reduced-sodium). Regular tomato and vegetable juice (not low-sodium or reduced-sodium). Pickles. Olives. Fruits Canned fruit in a light or heavy syrup. Fried fruit. Fruit in cream or butter sauce. Meat and other protein foods Fatty cuts of meat. Ribs. Fried meat. Bacon. Sausage. Bologna and other processed lunch meats. Salami. Fatback. Hotdogs. Bratwurst. Salted nuts and seeds. Canned beans with added salt. Canned or smoked fish. Whole eggs or egg yolks. Chicken or turkey with skin. Dairy Whole or 2% milk, cream, and half-and-half. Whole or full-fat cream cheese. Whole-fat or sweetened yogurt. Full-fat cheese. Nondairy creamers. Whipped toppings.  Processed cheese and cheese spreads. Fats and oils Butter. Stick margarine. Lard. Shortening. Ghee. Bacon fat. Tropical oils, such as coconut, palm kernel, or palm oil. Seasoning and other foods Salted popcorn and pretzels. Onion salt, garlic salt, seasoned salt, table salt, and sea salt. Worcestershire sauce. Tartar sauce. Barbecue sauce. Teriyaki sauce. Soy sauce, including reduced-sodium. Steak sauce. Canned and packaged gravies. Fish sauce. Oyster sauce. Cocktail sauce. Horseradish that you find on the shelf. Ketchup. Mustard. Meat flavorings and tenderizers. Bouillon cubes. Hot sauce and Tabasco sauce. Premade or packaged marinades. Premade or packaged taco seasonings. Relishes. Regular salad dressings. Where to find more information:  National Heart, Lung, and Blood Institute: www.nhlbi.nih.gov  American Heart Association: www.heart.org Summary  The DASH eating plan is a healthy eating plan that has been shown to reduce high blood pressure (hypertension). It may also reduce your risk for type 2 diabetes, heart disease, and stroke.  With the DASH eating plan, you should limit salt (sodium) intake to 2,300 mg a day. If you have hypertension, you may need to reduce your sodium intake to 1,500 mg a day.  When on the DASH eating plan, aim to eat more fresh fruits and vegetables, whole grains, lean proteins, low-fat dairy, and heart-healthy fats.  Work with your health care provider or diet and nutrition specialist (dietitian) to adjust your eating plan to your   individual calorie needs. This information is not intended to replace advice given to you by your health care provider. Make sure you discuss any questions you have with your health care provider. Document Revised: 01/13/2017 Document Reviewed: 01/25/2016 Elsevier Patient Education  2020 Elsevier Inc. Fat and Cholesterol Restricted Eating Plan Getting too much fat and cholesterol in your diet may cause health problems. Choosing the  right foods helps keep your fat and cholesterol at normal levels. This can keep you from getting certain diseases. Your doctor may recommend an eating plan that includes:  Total fat: ______% or less of total calories a day.  Saturated fat: ______% or less of total calories a day.  Cholesterol: less than _________mg a day.  Fiber: ______g a day. What are tips for following this plan? Meal planning  At meals, divide your plate into four equal parts: ? Fill one-half of your plate with vegetables and green salads. ? Fill one-fourth of your plate with whole grains. ? Fill one-fourth of your plate with low-fat (lean) protein foods.  Eat fish that is high in omega-3 fats at least two times a week. This includes mackerel, tuna, sardines, and salmon.  Eat foods that are high in fiber, such as whole grains, beans, apples, broccoli, carrots, peas, and barley. General tips   Work with your doctor to lose weight if you need to.  Avoid: ? Foods with added sugar. ? Fried foods. ? Foods with partially hydrogenated oils.  Limit alcohol intake to no more than 1 drink a day for nonpregnant women and 2 drinks a day for men. One drink equals 12 oz of beer, 5 oz of wine, or 1 oz of hard liquor. Reading food labels  Check food labels for: ? Trans fats. ? Partially hydrogenated oils. ? Saturated fat (g) in each serving. ? Cholesterol (mg) in each serving. ? Fiber (g) in each serving.  Choose foods with healthy fats, such as: ? Monounsaturated fats. ? Polyunsaturated fats. ? Omega-3 fats.  Choose grain products that have whole grains. Look for the word "whole" as the first word in the ingredient list. Cooking  Cook foods using low-fat methods. These include baking, boiling, grilling, and broiling.  Eat more home-cooked foods. Eat at restaurants and buffets less often.  Avoid cooking using saturated fats, such as butter, cream, palm oil, palm kernel oil, and coconut oil. Recommended  foods  Fruits  All fresh, canned (in natural juice), or frozen fruits. Vegetables  Fresh or frozen vegetables (raw, steamed, roasted, or grilled). Green salads. Grains  Whole grains, such as whole wheat or whole grain breads, crackers, cereals, and pasta. Unsweetened oatmeal, bulgur, barley, quinoa, or brown rice. Corn or whole wheat flour tortillas. Meats and other protein foods  Ground beef (85% or leaner), grass-fed beef, or beef trimmed of fat. Skinless chicken or turkey. Ground chicken or turkey. Pork trimmed of fat. All fish and seafood. Egg whites. Dried beans, peas, or lentils. Unsalted nuts or seeds. Unsalted canned beans. Nut butters without added sugar or oil. Dairy  Low-fat or nonfat dairy products, such as skim or 1% milk, 2% or reduced-fat cheeses, low-fat and fat-free ricotta or cottage cheese, or plain low-fat and nonfat yogurt. Fats and oils  Tub margarine without trans fats. Light or reduced-fat mayonnaise and salad dressings. Avocado. Olive, canola, sesame, or safflower oils. The items listed above may not be a complete list of foods and beverages you can eat. Contact a dietitian for more information. Foods to avoid Fruits    Canned fruit in heavy syrup. Fruit in cream or butter sauce. Fried fruit. Vegetables  Vegetables cooked in cheese, cream, or butter sauce. Fried vegetables. Grains  White bread. White pasta. White rice. Cornbread. Bagels, pastries, and croissants. Crackers and snack foods that contain trans fat and hydrogenated oils. Meats and other protein foods  Fatty cuts of meat. Ribs, chicken wings, bacon, sausage, bologna, salami, chitterlings, fatback, hot dogs, bratwurst, and packaged lunch meats. Liver and organ meats. Whole eggs and egg yolks. Chicken and turkey with skin. Fried meat. Dairy  Whole or 2% milk, cream, half-and-half, and cream cheese. Whole milk cheeses. Whole-fat or sweetened yogurt. Full-fat cheeses. Nondairy creamers and whipped  toppings. Processed cheese, cheese spreads, and cheese curds. Beverages  Alcohol. Sugar-sweetened drinks such as sodas, lemonade, and fruit drinks. Fats and oils  Butter, stick margarine, lard, shortening, ghee, or bacon fat. Coconut, palm kernel, and palm oils. Sweets and desserts  Corn syrup, sugars, honey, and molasses. Candy. Jam and jelly. Syrup. Sweetened cereals. Cookies, pies, cakes, donuts, muffins, and ice cream. The items listed above may not be a complete list of foods and beverages you should avoid. Contact a dietitian for more information. Summary  Choosing the right foods helps keep your fat and cholesterol at normal levels. This can keep you from getting certain diseases.  At meals, fill one-half of your plate with vegetables and green salads.  Eat high-fiber foods, like whole grains, beans, apples, carrots, peas, and barley.  Limit added sugar, saturated fats, alcohol, and fried foods. This information is not intended to replace advice given to you by your health care provider. Make sure you discuss any questions you have with your health care provider. Document Revised: 10/04/2017 Document Reviewed: 10/18/2016 Elsevier Patient Education  2020 Elsevier Inc.  

## 2019-09-17 ENCOUNTER — Telehealth: Payer: Self-pay | Admitting: Gerontology

## 2019-09-17 NOTE — Telephone Encounter (Addendum)
Left message w patient  ----- Message from Rolm Gala, NP sent at 09/10/2019  2:23 PM EDT ----- Pls schedule an in clinic F/U appointment for Jasmine [name] on 01/23/2020. Pls make a telephone note. Thank you.

## 2019-10-17 ENCOUNTER — Other Ambulatory Visit: Payer: Self-pay | Admitting: Gerontology

## 2019-10-17 DIAGNOSIS — I10 Essential (primary) hypertension: Secondary | ICD-10-CM

## 2019-12-09 ENCOUNTER — Other Ambulatory Visit: Payer: Self-pay

## 2019-12-11 ENCOUNTER — Other Ambulatory Visit: Payer: Self-pay | Admitting: *Deleted

## 2019-12-11 ENCOUNTER — Other Ambulatory Visit: Payer: Self-pay

## 2019-12-11 ENCOUNTER — Inpatient Hospital Stay: Payer: Self-pay | Attending: Oncology

## 2019-12-11 DIAGNOSIS — D509 Iron deficiency anemia, unspecified: Secondary | ICD-10-CM

## 2019-12-11 LAB — CBC
HCT: 47.3 % — ABNORMAL HIGH (ref 36.0–46.0)
Hemoglobin: 16.1 g/dL — ABNORMAL HIGH (ref 12.0–15.0)
MCH: 31.3 pg (ref 26.0–34.0)
MCHC: 34 g/dL (ref 30.0–36.0)
MCV: 92 fL (ref 80.0–100.0)
Platelets: 270 10*3/uL (ref 150–400)
RBC: 5.14 MIL/uL — ABNORMAL HIGH (ref 3.87–5.11)
RDW: 12.8 % (ref 11.5–15.5)
WBC: 8.6 10*3/uL (ref 4.0–10.5)
nRBC: 0 % (ref 0.0–0.2)

## 2019-12-11 LAB — IRON AND TIBC
Iron: 81 ug/dL (ref 28–170)
Saturation Ratios: 16 % (ref 10.4–31.8)
TIBC: 512 ug/dL — ABNORMAL HIGH (ref 250–450)
UIBC: 431 ug/dL

## 2019-12-11 LAB — FERRITIN: Ferritin: 19 ng/mL (ref 11–307)

## 2019-12-11 LAB — VITAMIN B12: Vitamin B-12: 1195 pg/mL — ABNORMAL HIGH (ref 180–914)

## 2019-12-12 ENCOUNTER — Inpatient Hospital Stay (HOSPITAL_BASED_OUTPATIENT_CLINIC_OR_DEPARTMENT_OTHER): Payer: Self-pay | Admitting: Oncology

## 2019-12-12 ENCOUNTER — Encounter: Payer: Self-pay | Admitting: Oncology

## 2019-12-12 DIAGNOSIS — D509 Iron deficiency anemia, unspecified: Secondary | ICD-10-CM

## 2019-12-12 DIAGNOSIS — Z86718 Personal history of other venous thrombosis and embolism: Secondary | ICD-10-CM

## 2019-12-12 NOTE — Progress Notes (Signed)
Pt has 2 jobs and 1 of them requires physical work. She does feel a difference from iron pills vs IV iron. She has discovered that when she eats eggs it makes her stomach hurt and have bowel movements. She is drinking and eating good.

## 2019-12-14 NOTE — Progress Notes (Signed)
I connected with Jasmine [name] on 12/14/19 at  2:00 PM EDT by video enabled telemedicine visit and verified that I am speaking with the correct person using two identifiers.   I discussed the limitations, risks, security and privacy concerns of performing an evaluation and management service by telemedicine and the availability of in-person appointments. I also discussed with the patient that there may be a patient responsible charge related to this service. The patient expressed understanding and agreed to proceed.  Problems with video visit and was switched to a telephone call  Other persons participating in the visit and their role in the encounter:  none  [place] Provider's location:  work  Stage manager Complaint: Routine follow-up of iron deficiency anemia and history of cerebral venous thrombosis  History of present illness: patient is a 59 year old Caucasian female with a history ofExtensive dural venous sinus thrombosis back in 2013. She was started on Coumadin at that time. She had hypercoagulable work-up done including prothrombin gene mutation, protein C, protein S activity, factor V activity and Antithrombin III levels which were unremarkable. She also had work-up for antiphospholipid antibody syndrome which was negative. No apparent cause for her dural venous sinus thrombosis was found at that time. She continues to follow-up with neurology and was seen by them in 2016. At that time her Coumadin was stopped as repeat MRI showed significant improvement in these findings and patient has not had any current strokes. She was switched to low-dose aspirin. She then presented with a second episode of dural venous sinus thrombosis in 2018. At that time she was started on rivaraoxaban and she continues to be on that. CBC in September 2018 showed a white count of 14.9, hemoglobin interval 9/30.4 with an MCV of 71.2 and a platelet count of 326 which was lower as compared to her  baseline hemoglobin of 14 in 2013.  She now presents to the hospital with right upper quadrant abdominal pain and she was found to haveNew onset anemia with a hemoglobin of 6.5/25.3 with an MCV of 68.9 and was hence admitted. B12 levels were low at 100. Ferritin levels were low at 2 and iron studies showed elevated TIBC of 580 with a low iron saturation of 7%. Folate levels were normal.  egd colonoscopy and capsule study were unremarkable except for large hiatal hernia which was likely the cause of her iron deficiency anemia. She underwent Nissen fundoplication in July 2020  Interval history patient reports feeling well and has not had any worsening headaches loss of vision fever or neck pain.  She has been taking Eliquis consistently without missing any doses   Review of Systems  Constitutional: Negative for chills, fever, malaise/fatigue and weight loss.  HENT: Negative for congestion, ear discharge and nosebleeds.   Eyes: Negative for blurred vision.  Respiratory: Negative for cough, hemoptysis, sputum production, shortness of breath and wheezing.   Cardiovascular: Negative for chest pain, palpitations, orthopnea and claudication.  Gastrointestinal: Negative for abdominal pain, blood in stool, constipation, diarrhea, heartburn, melena, nausea and vomiting.  Genitourinary: Negative for dysuria, flank pain, frequency, hematuria and urgency.  Musculoskeletal: Negative for back pain, joint pain and myalgias.  Skin: Negative for rash.  Neurological: Negative for dizziness, tingling, focal weakness, seizures, weakness and headaches.  Endo/Heme/Allergies: Does not bruise/bleed easily.  Psychiatric/Behavioral: Negative for depression and suicidal ideas. The patient does not have insomnia.     Allergies  Allergen Reactions  . Food Other (See Comments)    Pumpkins/squash/zucchini (gourds foods) swelling  of the face  . Tape Itching, Dermatitis, Rash and Other (See Comments)    Transpore  tape  . Povidone Iodine   . Codeine Other (See Comments)    Crawling skin  . Shellfish Allergy Other (See Comments)    Crawling skin/stomach cramps.    Past Medical History:  Diagnosis Date  . Anemia   . Arthritis    LEFT SHOULDER  . H/O cerebral venous sinus thrombosis 2013  . History of hiatal hernia   . History of kidney stones     Past Surgical History:  Procedure Laterality Date  . COLONOSCOPY WITH PROPOFOL N/A 07/02/2018   Procedure: COLONOSCOPY WITH PROPOFOL;  Surgeon: Pasty Spillers, MD;  Location: [facility] ENDOSCOPY;  Service: Endoscopy;  Laterality: N/A;  . ENTEROSCOPY N/A 07/17/2018   Procedure: ENTEROSCOPY;  Surgeon: Wyline Mood, MD;  Location[facility] ENDOSCOPY;  Service: Gastroenterology;  Laterality: N/A;  . ESOPHAGOGASTRODUODENOSCOPY N/A 2001   approximately- for Reflux  . ESOPHAGOGASTRODUODENOSCOPY (EGD) WITH PROPOFOL N/A 07/02/2018   Procedure: ESOPHAGOGASTRODUODENOSCOPY (EGD) WITH PROPOFOL;  Surgeon: Pasty Spillers, MD;  Location: [facility] ENDOSCOPY;  Service: Endoscopy;  Laterality: N/A;  . ESOPHAGOGASTRODUODENOSCOPY (EGD) WITH PROPOFOL N/A 07/11/2018   Procedure: ESOPHAGOGASTRODUODENOSCOPY (EGD) WITH PROPOFOL;  Surgeon: Sherrilyn Rist, MD;  Location: [facility] ENDOSCOPY;  Service: Gastroenterology;  Laterality: N/A;  PT TO HAVE CAPSULE PLACEMENT  . GIVENS CAPSULE STUDY N/A 07/11/2018   Procedure: GIVENS CAPSULE STUDY;  Surgeon: Sherrilyn Rist, MD;  Location: [facility] ENDOSCOPY;  Service: Gastroenterology;  Laterality: N/A;  . HIATAL HERNIA REPAIR  08/23/2018   XI ROBOTIC ASSISTED HIATAL HERNIA REPAIR  WITH FUNDOPLICATION    Social History   Socioeconomic History  . Marital status: Single    Spouse name: Not on file  . Number of children: Not on file  . Years of education: Not on file  . Highest education level: Not on file  Occupational History  . Not on file  Tobacco Use  . Smoking status: Never Smoker  . Smokeless tobacco: Never Used  Vaping Use  .  Vaping Use: Never used  Substance and Sexual Activity  . Alcohol use: Yes    Comment: occasional  . Drug use: Yes    Types: Marijuana    Comment: occasional  . Sexual activity: Not Currently  Other Topics Concern  . Not on file  Social History Narrative   Lives at home with her parents.  Independent at baseline.   Social Determinants of Health   Financial Resource Strain:   . Difficulty of Paying Living Expenses: Not on file  Food Insecurity:   . Worried About Programme researcher, broadcasting/film/video in the Last Year: Not on file  . Ran Out of Food in the Last Year: Not on file  Transportation Needs:   . Lack of Transportation (Medical): Not on file  . Lack of Transportation (Non-Medical): Not on file  Physical Activity:   . Days of Exercise per Week: Not on file  . Minutes of Exercise per Session: Not on file  Stress:   . Feeling of Stress : Not on file  Social Connections:   . Frequency of Communication with Friends and Family: Not on file  . Frequency of Social Gatherings with Friends and Family: Not on file  . Attends Religious Services: Not on file  . Active Member of Clubs or Organizations: Not on file  . Attends Banker Meetings: Not on file  . Marital Status: Not on file  Intimate  Partner Violence:   . Fear of Current or Ex-Partner: Not on file  . Emotionally Abused: Not on file  . Physically Abused: Not on file  . Sexually Abused: Not on file    Family History  Problem Relation Age of Onset  . Breast cancer Mother   . Bone cancer Mother   . Obesity Father      Current Outpatient Medications:  .  amLODipine (NORVASC) 5 MG tablet, TAKE ONE TABLET BY MOUTH EVERY DAY, Disp: 30 tablet, Rfl: 0 .  rivaroxaban (XARELTO) 20 MG TABS tablet, Take 1 tablet (20 mg total) by mouth daily at 8 pm., Disp: 90 tablet, Rfl: 3 .  vitamin B-12 (CYANOCOBALAMIN) 500 MCG tablet, Take 500 mcg by mouth daily., Disp: , Rfl:  No current facility-administered medications for this  visit.  Facility-Administered Medications Ordered in Other Visits:  .  cyanocobalamin ((VITAMIN B-12)) injection 1,000 mcg, 1,000 mcg, Intramuscular, Q30 days, Creig Hines, MD, 1,000 mcg at 10/09/18 1116  No results found.  No images are attached to the encounter.   CMP Latest Ref Rng & Units 08/24/2018  Glucose 70 - 99 mg/dL 619(J)  BUN 6 - 20 mg/dL 9  Creatinine 0.93 - 2.67 mg/dL 1.24  Sodium 580 - 998 mmol/L 139  Potassium 3.5 - 5.1 mmol/L 4.4  Chloride 98 - 111 mmol/L 104  CO2 22 - 32 mmol/L 25  Calcium 8.9 - 10.3 mg/dL 9.3  Total Protein 6.5 - 8.1 g/dL -  Total Bilirubin 0.3 - 1.2 mg/dL -  Alkaline Phos 38 - 338 U/L -  AST 15 - 41 U/L -  ALT 0 - 44 U/L -   CBC Latest Ref Rng & Units 12/11/2019  WBC 4.0 - 10.5 K/uL 8.6  Hemoglobin 12.0 - 15.0 g/dL 16.1(H)  Hematocrit 36 - 46 % 47.3(H)  Platelets 150 - 400 K/uL 270     Assessment and plan:Patient is a 59 yr old female with h/o cavernous sinus thrombosis and h/o iron deficiency anemia and this is a routine f/u visit.  1. Cavernous sinus thrombosis- she has had 2 episodes and will continue anticoagulation indefinitely  2. Iron deficiency anemia- although ferritin is low, hb is 16. Will not need IV or PO iron  Follow-up instructions:cbc ferritin and iron studies in 6 months and see me in person  I discussed the assessment and treatment plan with the patient. The patient was provided an opportunity to ask questions and all were answered. The patient agreed with the plan and demonstrated an understanding of the instructions.   The patient was advised to call back or seek an in-person evaluation if the symptoms worsen or if the condition fails to improve as anticipated.    Visit Diagnosis: 1. Iron deficiency anemia, unspecified iron deficiency anemia type   2. History of cerebral venous sinus thrombosis     Dr. Owens Shark, MD, MPH [facility] Tel- [phone] 12/14/2019 4:33 PM

## 2019-12-18 ENCOUNTER — Other Ambulatory Visit: Payer: Self-pay | Admitting: Gerontology

## 2019-12-18 DIAGNOSIS — I10 Essential (primary) hypertension: Secondary | ICD-10-CM

## 2019-12-18 MED ORDER — AMLODIPINE BESYLATE 5 MG PO TABS: 5.0000 mg | ORAL_TABLET | Freq: Every day | ORAL | 3 refills | Status: DC

## 2020-03-26 ENCOUNTER — Telehealth: Payer: Self-pay | Admitting: Pharmacist

## 2020-03-26 NOTE — Telephone Encounter (Signed)
Patient still needs to provide 2021 federal Tax Return. If patient fails to provide tax return by 06/14/20, medication assistance at [facility] will stop.   Jasmine [name] [facility] Environmental health practitioner

## 2020-03-31 ENCOUNTER — Other Ambulatory Visit: Payer: Self-pay | Admitting: Gerontology

## 2020-03-31 ENCOUNTER — Telehealth: Payer: Self-pay | Admitting: Gerontology

## 2020-03-31 DIAGNOSIS — I10 Essential (primary) hypertension: Secondary | ICD-10-CM

## 2020-03-31 MED ORDER — AMLODIPINE BESYLATE 5 MG PO TABS: 5.0000 mg | ORAL_TABLET | Freq: Every day | ORAL | 0 refills | Status: DC

## 2020-03-31 NOTE — Telephone Encounter (Signed)
Scheduled Pt to have a lab appointment on 3/2 at 11:45.

## 2020-04-06 ENCOUNTER — Ambulatory Visit: Payer: Self-pay | Admitting: Pharmacist

## 2020-04-06 ENCOUNTER — Encounter: Payer: Self-pay | Admitting: Pharmacist

## 2020-04-06 ENCOUNTER — Other Ambulatory Visit: Payer: Self-pay

## 2020-04-06 DIAGNOSIS — Z79899 Other long term (current) drug therapy: Secondary | ICD-10-CM

## 2020-04-06 NOTE — Progress Notes (Signed)
[facility]ication Management [facility] Visit Note  Patient: Jasmine [name] MRN: [id number] Date of Birth: [birth year] PCP: Rolm Gala, NP   [name] 60 y.o. female, contacted via telephone today for her annual medication therapy management review with the pharmacist. Patient was identified by name and date of birth.  There were no vitals taken for this visit.  Patient Information   Past Medical History:  Diagnosis Date  . Anemia   . Arthritis    LEFT SHOULDER  . H/O cerebral venous sinus thrombosis 2013  . History of hiatal hernia   . History of kidney stones       Past Surgical History:  Procedure Laterality Date  . COLONOSCOPY WITH PROPOFOL N/A 07/02/2018   Procedure: COLONOSCOPY WITH PROPOFOL;  Surgeon: Pasty Spillers, MD;  Location: [facility] ENDOSCOPY;  Service: Endoscopy;  Laterality: N/A;  . ENTEROSCOPY N/A 07/17/2018   Procedure: ENTEROSCOPY;  Surgeon: Wyline Mood, MD;  Location: [facility] ENDOSCOPY;  Service: Gastroenterology;  Laterality: N/A;  . ESOPHAGOGASTRODUODENOSCOPY N/A 2001   approximately- for Reflux  . ESOPHAGOGASTRODUODENOSCOPY (EGD) WITH PROPOFOL N/A 07/02/2018   Procedure: ESOPHAGOGASTRODUODENOSCOPY (EGD) WITH PROPOFOL;  Surgeon: Pasty Spillers, MD;  Location: [facility] ENDOSCOPY;  Service: Endoscopy;  Laterality: N/A;  . ESOPHAGOGASTRODUODENOSCOPY (EGD) WITH PROPOFOL N/A 07/11/2018   Procedure: ESOPHAGOGASTRODUODENOSCOPY (EGD) WITH PROPOFOL;  Surgeon: Sherrilyn Rist, MD;  Location: [facility] ENDOSCOPY;  Service: Gastroenterology;  Laterality: N/A;  PT TO HAVE CAPSULE PLACEMENT  . GIVENS CAPSULE STUDY N/A 07/11/2018   Procedure: GIVENS CAPSULE STUDY;  Surgeon: Sherrilyn Rist, MD;  Location: [facility] ENDOSCOPY;  Service: Gastroenterology;  Laterality: N/A;  . HIATAL HERNIA REPAIR  08/23/2018   XI ROBOTIC ASSISTED HIATAL HERNIA REPAIR  WITH FUNDOPLICATION     Family History  Problem Relation Age of Onset  . Breast cancer Mother   . Bone cancer Mother   . Obesity  Father     New Diagnoses (since last visit): n/a  Family Support: Good  Lifestyle Diet: Eats 2 meals per day, usually skips breakfast. Trying to limit salt and fried foods, however, this is difficult with her work schedule. Typically snacks on fruit in the evening after work.           Social History   Substance and Sexual Activity  Alcohol Use Yes   Comment: occasional      Social History   Tobacco Use  Smoking Status Never Smoker  Smokeless Tobacco Never Used      Health Maintenance  Topic Date Due  . Hepatitis C Screening  Never done  . COVID-19 Vaccine (1) Never done  . TETANUS/TDAP  Never done  . PAP SMEAR-Modifier  Never done  . MAMMOGRAM  Never done  . INFLUENZA VACCINE  Never done  . COLONOSCOPY (Pts 45-21yrs Insurance coverage will need to be confirmed)  07/01/2028  . HIV Screening  Completed   Outpatient Encounter Medications as of 04/06/2020  Medication Sig  . acetaminophen (TYLENOL) 500 MG tablet Take 500 mg by mouth as needed.  Marland Kitchen amLODipine (NORVASC) 5 MG tablet Take 1 tablet (5 mg total) by mouth daily.  . rivaroxaban (XARELTO) 20 MG TABS tablet Take 1 tablet (20 mg total) by mouth daily at 8 pm.  . vitamin B-12 (CYANOCOBALAMIN) 500 MCG tablet Take 500 mcg by mouth daily.   Facility-Administered Encounter Medications as of 04/06/2020  Medication  . cyanocobalamin ((VITAMIN B-12)) injection 1,000 mcg    Health Maintenance/Date Completed  Last ED visit: 07/2018 Last Visit to  PCP: 08/2019 Next Visit to PCP: 04/2020 (labs) Specialist Visit: 06/2020 Dr. Hurley Cisco Exam: none recent Mammogram: none recent Colonoscopy: 06/2018 Flu Vaccine: none recent  Pneumonia Vaccine: none COVID-19 Vaccine: none   Assessment and Plan:   Adherence: Takes medications as prescribed. Uses a pill box. Takes the amlodipine in the morning and the Xarelto in the evening. Rarely misses a dose of her medications.  Cerebral Venous Sinus Thrombosis: 2013, 2018 Has  been taking Xarelto since 2018, previously on Coumadin. Reviewed the importance of taking Xarelto with food. Followed by Dr. Smith Robert.  Iron Deficiency Anemia: Followed by Dr. Smith Robert, last visit was 12/14/19, next visit scheduled for 06/2020. Currently taking oral B12 daily. Patient states her energy was so much better when she received the B12 injections. Labs from 12/11/19: B12 = 1195, Iron = 81, TIBC = 512, Ferritin = 19, Hg = 16.1 Repeat labs scheduled for 06/16/20.  Hiatal Hernia: S/p Nissen fundoplication with repair of hiatal hernia 08/2018.  Health Prevention: Discussed importance of vaccinations. Discussed options for mammogram/PAP smear with [facility] program; will send her the brochure.   RTC 1 year for Outreach MTM  Edina Winningham K. Joelene Millin, PharmD [facility] Clinic-Pharmacy Operations Coordinator [phone]

## 2020-04-15 ENCOUNTER — Other Ambulatory Visit: Payer: Self-pay

## 2020-04-15 DIAGNOSIS — D509 Iron deficiency anemia, unspecified: Secondary | ICD-10-CM

## 2020-04-15 NOTE — Progress Notes (Signed)
cbc

## 2020-04-16 LAB — CBC WITH DIFFERENTIAL/PLATELET
Basophils Absolute: 0.1 10*3/uL (ref 0.0–0.2)
Basos: 1 %
EOS (ABSOLUTE): 0.2 10*3/uL (ref 0.0–0.4)
Eos: 3 %
Hematocrit: 46.4 % (ref 34.0–46.6)
Hemoglobin: 15.9 g/dL (ref 11.1–15.9)
Immature Grans (Abs): 0 10*3/uL (ref 0.0–0.1)
Immature Granulocytes: 0 %
Lymphocytes Absolute: 1.4 10*3/uL (ref 0.7–3.1)
Lymphs: 17 %
MCH: 30.8 pg (ref 26.6–33.0)
MCHC: 34.3 g/dL (ref 31.5–35.7)
MCV: 90 fL (ref 79–97)
Monocytes Absolute: 0.4 10*3/uL (ref 0.1–0.9)
Monocytes: 5 %
Neutrophils Absolute: 5.8 10*3/uL (ref 1.4–7.0)
Neutrophils: 74 %
Platelets: 270 10*3/uL (ref 150–450)
RBC: 5.16 x10E6/uL (ref 3.77–5.28)
RDW: 12.4 % (ref 11.7–15.4)
WBC: 7.8 10*3/uL (ref 3.4–10.8)

## 2020-04-16 LAB — IRON,TIBC AND FERRITIN PANEL
Ferritin: 34 ng/mL (ref 15–150)
Iron Saturation: 13 % — ABNORMAL LOW (ref 15–55)
Iron: 56 ug/dL (ref 27–159)
Total Iron Binding Capacity: 416 ug/dL (ref 250–450)
UIBC: 360 ug/dL (ref 131–425)

## 2020-04-22 ENCOUNTER — Other Ambulatory Visit: Payer: Self-pay

## 2020-04-22 ENCOUNTER — Telehealth: Payer: Self-pay | Admitting: Gerontology

## 2020-04-22 ENCOUNTER — Other Ambulatory Visit: Payer: Self-pay | Admitting: Gerontology

## 2020-04-22 DIAGNOSIS — Z0000 Encounter for general adult medical examination without abnormal findings: Secondary | ICD-10-CM

## 2020-04-22 DIAGNOSIS — I10 Essential (primary) hypertension: Secondary | ICD-10-CM

## 2020-04-22 MED ORDER — AMLODIPINE BESYLATE 5 MG PO TABS: 5.0000 mg | ORAL_TABLET | Freq: Every day | ORAL | 1 refills | Status: DC

## 2020-04-22 NOTE — Progress Notes (Signed)
Established Patient Office Visit  Subjective:  Patient ID: Jasmine [name], female    DOB: 08-Nov-1960  Age: 60 y.o. MRN: [id number]  CC: No chief complaint on file.  Patient consents to telephone visit and 2 patient identifiers was used to identify patient.  HPI Jasmine [name] presents for follow up of hypertension and lab review. She states that she's compliant with her medications and continues to make healthy lifestyle changes. Her Iron panel was normal except iron saturation of13% and she will continue to follow up with Hematologist Dr Janese Banks. She checks her blood pressure at home and states that it's usually less than 140/90. She c/o blurry vision and requests an Ophthalmology referral. She denies chest pain, palpitation, dizziness, peripheral edema, hematuria, hematochezia and active bleeding. Overall, she states that she's doing well and offers no further complaint.    Past Medical History:  Diagnosis Date  . Anemia   . Arthritis    LEFT SHOULDER  . H/O cerebral venous sinus thrombosis 2013  . History of hiatal hernia   . History of kidney stones     Past Surgical History:  Procedure Laterality Date  . COLONOSCOPY WITH PROPOFOL N/A 07/02/2018   Procedure: COLONOSCOPY WITH PROPOFOL;  Surgeon: Virgel Manifold, MD;  Location: [facility] ENDOSCOPY;  Service: Endoscopy;  Laterality: N/A;  . ENTEROSCOPY N/A 07/17/2018   Procedure: ENTEROSCOPY;  Surgeon: Jonathon Bellows, MD;  Location: [facility] ENDOSCOPY;  Service: Gastroenterology;  Laterality: N/A;  . ESOPHAGOGASTRODUODENOSCOPY N/A 2001   approximately- for Reflux  . ESOPHAGOGASTRODUODENOSCOPY (EGD) WITH PROPOFOL N/A 07/02/2018   Procedure: ESOPHAGOGASTRODUODENOSCOPY (EGD) WITH PROPOFOL;  Surgeon: Virgel Manifold, MD;  Location: [facility] ENDOSCOPY;  Service: Endoscopy;  Laterality: N/A;  . ESOPHAGOGASTRODUODENOSCOPY (EGD) WITH PROPOFOL N/A 07/11/2018   Procedure: ESOPHAGOGASTRODUODENOSCOPY (EGD) WITH PROPOFOL;  Surgeon: Doran Stabler, MD;   Location: [place];  Service: Gastroenterology;  Laterality: N/A;  PT TO HAVE CAPSULE PLACEMENT  . GIVENS CAPSULE STUDY N/A 07/11/2018   Procedure: GIVENS CAPSULE STUDY;  Surgeon: Doran Stabler, MD;  Location: [place];  Service: Gastroenterology;  Laterality: N/A;  . HIATAL HERNIA REPAIR  08/23/2018   XI ROBOTIC ASSISTED HIATAL HERNIA REPAIR  WITH FUNDOPLICATION    Family History  Problem Relation Age of Onset  . Breast cancer Mother   . Bone cancer Mother   . Obesity Father     Social History   Socioeconomic History  . Marital status: Single    Spouse name: Not on file  . Number of children: Not on file  . Years of education: Not on file  . Highest education level: Not on file  Occupational History  . Not on file  Tobacco Use  . Smoking status: Never Smoker  . Smokeless tobacco: Never Used  Vaping Use  . Vaping Use: Never used  Substance and Sexual Activity  . Alcohol use: Yes    Comment: occasional  . Drug use: Yes    Types: Marijuana    Comment: occasional  . Sexual activity: Not Currently  Other Topics Concern  . Not on file  Social History Narrative   Lives at home with her parents.  Independent at baseline.   Social Determinants of Health   Financial Resource Strain: Not on file  Food Insecurity: Not on file  Transportation Needs: Not on file  Physical Activity: Not on file  Stress: Not on file  Social Connections: Not on file  Intimate Partner Violence: Not on file    Outpatient Medications Prior  to Visit  Medication Sig Dispense Refill  . acetaminophen (TYLENOL) 500 MG tablet Take 500 mg by mouth as needed.    . rivaroxaban (XARELTO) 20 MG TABS tablet Take 1 tablet (20 mg total) by mouth daily at 8 pm. 90 tablet 3  . vitamin B-12 (CYANOCOBALAMIN) 500 MCG tablet Take 500 mcg by mouth daily.    Marland Kitchen amLODipine (NORVASC) 5 MG tablet Take 1 tablet (5 mg total) by mouth daily. 30 tablet 0   Facility-Administered Medications Prior to Visit   Medication Dose Route Frequency Provider Last Rate Last Admin  . cyanocobalamin ((VITAMIN B-12)) injection 1,000 mcg  1,000 mcg Intramuscular Q30 days Sindy Guadeloupe, MD   1,000 mcg at 10/09/18 1116    Allergies  Allergen Reactions  . Food Other (See Comments)    Pumpkins/squash/zucchini (gourds foods) swelling of the face  . Tape Itching, Dermatitis, Rash and Other (See Comments)    Transpore tape  . Povidone Iodine   . Codeine Other (See Comments)    Crawling skin  . Shellfish Allergy Other (See Comments)    Crawling skin/stomach cramps.    ROS Review of Systems  Constitutional: Negative.   Eyes: Negative.   Respiratory: Negative.   Cardiovascular: Negative.   Genitourinary: Negative.   Skin: Negative.   Neurological: Negative.   Hematological: Negative.   Psychiatric/Behavioral: Negative.       Objective:    Physical Exam  There were no vitals taken for this visit. Wt Readings from Last 3 Encounters:  04/15/20 187 lb 3.2 oz (84.9 kg)  08/28/19 173 lb 14.4 oz (78.9 kg)  05/28/19 177 lb (80.3 kg)     Health Maintenance Due  Topic Date Due  . Hepatitis C Screening  Never done  . COVID-19 Vaccine (1) Never done  . TETANUS/TDAP  Never done  . PAP SMEAR-Modifier  Never done  . MAMMOGRAM  Never done  . INFLUENZA VACCINE  Never done    There are no preventive care reminders to display for this patient.  Lab Results  Component Value Date   TSH 2.300 02/20/2019   Lab Results  Component Value Date   WBC 7.8 04/15/2020   HGB 15.9 04/15/2020   HCT 46.4 04/15/2020   MCV 90 04/15/2020   PLT 270 04/15/2020   Lab Results  Component Value Date   NA 139 08/24/2018   K 4.4 08/24/2018   CO2 25 08/24/2018   GLUCOSE 125 (H) 08/24/2018   BUN 9 08/24/2018   CREATININE 0.61 08/24/2018   BILITOT 1.6 (H) 08/11/2018   ALKPHOS 79 08/11/2018   AST 35 08/11/2018   ALT 11 08/11/2018   PROT 8.2 (H) 08/11/2018   ALBUMIN 4.3 08/11/2018   CALCIUM 9.3 08/24/2018    ANIONGAP 10 08/24/2018   Lab Results  Component Value Date   CHOL 234 (H) 08/28/2019   Lab Results  Component Value Date   HDL 74 08/28/2019   Lab Results  Component Value Date   LDLCALC 146 (H) 08/28/2019   Lab Results  Component Value Date   TRIG 82 08/28/2019   Lab Results  Component Value Date   CHOLHDL 3.2 08/28/2019   Lab Results  Component Value Date   HGBA1C 5.1 02/20/2019      Assessment & Plan:    1. Essential hypertension - She will continue on current treatment regimen, DASH diet and exercise as tolerated. - amLODipine (NORVASC) 5 MG tablet; Take 1 tablet (5 mg total) by mouth daily.  Dispense:  90 tablet; Refill: 1  2. Health care maintenance - Routine labs will be collected and referral for [facility] exam with [facility] Dr Waldemar Dickens. - Ambulatory referral to Ophthalmology - Lipid panel; Future - Comp Met (CMET); Future - Urinalysis; Future - HgB A1c; Future    Follow-up: Return in about 18 weeks (around 08/26/2020), or if symptoms worsen or fail to improve.    Aanchal Cope Jerold Coombe, NP

## 2020-04-22 NOTE — Patient Instructions (Signed)

## 2020-06-14 ENCOUNTER — Other Ambulatory Visit: Payer: Self-pay | Admitting: *Deleted

## 2020-06-14 DIAGNOSIS — D509 Iron deficiency anemia, unspecified: Secondary | ICD-10-CM

## 2020-06-16 ENCOUNTER — Inpatient Hospital Stay (HOSPITAL_BASED_OUTPATIENT_CLINIC_OR_DEPARTMENT_OTHER): Payer: Self-pay | Admitting: Oncology

## 2020-06-16 ENCOUNTER — Encounter: Payer: Self-pay | Admitting: Oncology

## 2020-06-16 ENCOUNTER — Inpatient Hospital Stay: Payer: Self-pay | Attending: Oncology

## 2020-06-16 VITALS — BP 153/82 | HR 65 | Temp 97.5°F | Resp 20 | Wt 185.7 lb

## 2020-06-16 DIAGNOSIS — D509 Iron deficiency anemia, unspecified: Secondary | ICD-10-CM

## 2020-06-16 DIAGNOSIS — Z79899 Other long term (current) drug therapy: Secondary | ICD-10-CM | POA: Insufficient documentation

## 2020-06-16 DIAGNOSIS — Z86718 Personal history of other venous thrombosis and embolism: Secondary | ICD-10-CM | POA: Insufficient documentation

## 2020-06-16 DIAGNOSIS — D751 Secondary polycythemia: Secondary | ICD-10-CM | POA: Insufficient documentation

## 2020-06-16 DIAGNOSIS — Z7901 Long term (current) use of anticoagulants: Secondary | ICD-10-CM | POA: Insufficient documentation

## 2020-06-16 LAB — CBC
HCT: 46.2 % — ABNORMAL HIGH (ref 36.0–46.0)
Hemoglobin: 15.3 g/dL — ABNORMAL HIGH (ref 12.0–15.0)
MCH: 30.7 pg (ref 26.0–34.0)
MCHC: 33.1 g/dL (ref 30.0–36.0)
MCV: 92.6 fL (ref 80.0–100.0)
Platelets: 263 10*3/uL (ref 150–400)
RBC: 4.99 MIL/uL (ref 3.87–5.11)
RDW: 13 % (ref 11.5–15.5)
WBC: 10.6 10*3/uL — ABNORMAL HIGH (ref 4.0–10.5)
nRBC: 0 % (ref 0.0–0.2)

## 2020-06-16 LAB — IRON AND TIBC
Iron: 71 ug/dL (ref 28–170)
Saturation Ratios: 16 % (ref 10.4–31.8)
TIBC: 458 ug/dL — ABNORMAL HIGH (ref 250–450)
UIBC: 387 ug/dL

## 2020-06-16 LAB — FERRITIN: Ferritin: 19 ng/mL (ref 11–307)

## 2020-06-17 ENCOUNTER — Other Ambulatory Visit: Payer: Self-pay | Admitting: Gerontology

## 2020-06-17 NOTE — Telephone Encounter (Signed)
Patient had eye exam today (06/17/20). She will be coming back to [facility] to order glasses.

## 2020-06-18 MED FILL — Rivaroxaban Tab 20 MG: ORAL | 90 days supply | Qty: 90 | Fill #0 | Status: AC

## 2020-06-19 ENCOUNTER — Other Ambulatory Visit: Payer: Self-pay

## 2020-06-20 NOTE — Progress Notes (Signed)
Hematology/Oncology Consult note [facility]  [phone] [phone]  Patient Care Team: Rolm Gala, NP as PCP - General (Gerontology)   Name of the patient: Jasmine [name]  [id number]  08/31/1960   Date of visit: 06/20/20  Diagnosis-history of iron deficiency anemia and cerebral venous sinus thrombosis  Chief complaint/ Reason for visit-routine follow-up of iron deficiency anemia and cerebral venous sinus thrombosis  Heme/Onc history: patient is a 60 year old Caucasian female with a history ofExtensive dural venous sinus thrombosis back in 2013. She was started on Coumadin at that time. She had hypercoagulable work-up done including prothrombin gene mutation, protein C, protein S activity, factor V activity and Antithrombin III levels which were unremarkable. She also had work-up for antiphospholipid antibody syndrome which was negative. No apparent cause for her dural venous sinus thrombosis was found at that time. She continues to follow-up with neurology and was seen by them in 2016. At that time her Coumadin was stopped as repeat MRI showed significant improvement in these findings and patient has not had any current strokes. She was switched to low-dose aspirin. She then presented with a second episode of dural venous sinus thrombosis in 2018. At that time she was started on rivaraoxaban and she continues to be on that. CBC in September 2018 showed a white count of 14.9, hemoglobin interval 9/30.4 with an MCV of 71.2 and a platelet count of 326 which was lower as compared to her baseline hemoglobin of 14 in 2013.  She now presents to the hospital with right upper quadrant abdominal pain and she was found to haveNew onset anemia with a hemoglobin of 6.5/25.3 with an MCV of 68.9 and was hence admitted. B12 levels were low at 100. Ferritin levels were low at 2 and iron studies showed elevated TIBC of 580 with a low iron  saturation of 7%. Folate levels were normal.  egd colonoscopy and capsule study were unremarkable except for large hiatal hernia which was likely the cause of her iron deficiency anemia. She underwentNissen fundoplication in July 2020  Interval history-patient is tolerating Eliquis well without any significant side effects.  She has not missed any doses.  Denies any blood loss in her stool or urine.  ECOG PS- 1 Pain scale- 0   Review of systems- Review of Systems  Constitutional: Negative for chills, fever, malaise/fatigue and weight loss.  HENT: Negative for congestion, ear discharge and nosebleeds.   Eyes: Negative for blurred vision.  Respiratory: Negative for cough, hemoptysis, sputum production, shortness of breath and wheezing.   Cardiovascular: Negative for chest pain, palpitations, orthopnea and claudication.  Gastrointestinal: Negative for abdominal pain, blood in stool, constipation, diarrhea, heartburn, melena, nausea and vomiting.  Genitourinary: Negative for dysuria, flank pain, frequency, hematuria and urgency.  Musculoskeletal: Negative for back pain, joint pain and myalgias.  Skin: Negative for rash.  Neurological: Negative for dizziness, tingling, focal weakness, seizures, weakness and headaches.  Endo/Heme/Allergies: Does not bruise/bleed easily.  Psychiatric/Behavioral: Negative for depression and suicidal ideas. The patient does not have insomnia.       Allergies  Allergen Reactions  . Food Other (See Comments)    Pumpkins/squash/zucchini (gourds foods) swelling of the face  . Tape Itching, Dermatitis, Rash and Other (See Comments)    Transpore tape  . Povidone Iodine   . Codeine Other (See Comments)    Crawling skin  . Shellfish Allergy Other (See Comments)    Crawling skin/stomach cramps.     Past Medical History:  Diagnosis Date  . Anemia   . Arthritis    LEFT SHOULDER  . H/O cerebral venous sinus thrombosis 2013  . History of hiatal hernia    . History of kidney stones      Past Surgical History:  Procedure Laterality Date  . COLONOSCOPY WITH PROPOFOL N/A 07/02/2018   Procedure: COLONOSCOPY WITH PROPOFOL;  Surgeon: Pasty Spillers, MD;  Location: [facility] ENDOSCOPY;  Service: Endoscopy;  Laterality: N/A;  . ENTEROSCOPY N/A 07/17/2018   Procedure: ENTEROSCOPY;  Surgeon: Wyline Mood, MD;  Location: [facility]COPY;  Service: Gastroenterology;  Laterality: N/A;  . ESOPHAGOGASTRODUODENOSCOPY N/A 2001   approximately- for Reflux  . ESOPHAGOGASTRODUODENOSCOPY (EGD) WITH PROPOFOL N/A 07/02/2018   Procedure: ESOPHAGOGASTRODUODENOSCOPY (EGD) WITH PROPOFOL;  Surgeon: Pasty Spillers, MD;  Location: [facility] ENDOSCOPY;  Service: Endoscopy;  Laterality: N/A;  . ESOPHAGOGASTRODUODENOSCOPY (EGD) WITH PROPOFOL N/A 07/11/2018   Procedure: ESOPHAGOGASTRODUODENOSCOPY (EGD) WITH PROPOFOL;  Surgeon: Sherrilyn Rist, MD;  Location: [facility] ENDOSCOPY;  Service: Gastroenterology;  Laterality: N/A;  PT TO HAVE CAPSULE PLACEMENT  . GIVENS CAPSULE STUDY N/A 07/11/2018   Procedure: GIVENS CAPSULE STUDY;  Surgeon: Sherrilyn Rist, MD;  Location: [facility] ENDOSCOPY;  Service: Gastroenterology;  Laterality: N/A;  . HIATAL HERNIA REPAIR  08/23/2018   XI ROBOTIC ASSISTED HIATAL HERNIA REPAIR  WITH FUNDOPLICATION    Social History   Socioeconomic History  . Marital status: Single    Spouse name: Not on file  . Number of children: Not on file  . Years of education: Not on file  . Highest education level: Not on file  Occupational History  . Not on file  Tobacco Use  . Smoking status: Never Smoker  . Smokeless tobacco: Never Used  Vaping Use  . Vaping Use: Never used  Substance and Sexual Activity  . Alcohol use: Yes    Comment: occasional  . Drug use: Yes    Types: Marijuana    Comment: occasional  . Sexual activity: Not Currently  Other Topics Concern  . Not on file  Social History Narrative   Lives at home with her parents.  Independent at  baseline.   Social Determinants of Health   Financial Resource Strain: Not on file  Food Insecurity: Not on file  Transportation Needs: Not on file  Physical Activity: Not on file  Stress: Not on file  Social Connections: Not on file  Intimate Partner Violence: Not on file    Family History  Problem Relation Age of Onset  . Breast cancer Mother   . Bone cancer Mother   . Obesity Father      Current Outpatient Medications:  .  acetaminophen (TYLENOL) 500 MG tablet, Take 500 mg by mouth as needed., Disp: , Rfl:  .  amLODipine (NORVASC) 5 MG tablet, TAKE ONE TABLET BY MOUTH EVERY DAY, Disp: 90 tablet, Rfl: 1 .  rivaroxaban (XARELTO) 20 MG TABS tablet, TAKE ONE TABLET BY MOUTH EVERY DAY AT 8:00PM, Disp: 90 tablet, Rfl: 3 .  vitamin B-12 (CYANOCOBALAMIN) 500 MCG tablet, Take 500 mcg by mouth daily., Disp: , Rfl:  No current facility-administered medications for this visit.  Facility-Administered Medications Ordered in Other Visits:  .  cyanocobalamin ((VITAMIN B-12)) injection 1,000 mcg, 1,000 mcg, Intramuscular, Q30 days, Creig Hines, MD, 1,000 mcg at 10/09/18 1116  Physical exam:  Vitals:   06/16/20 1433  BP: (!) 153/82  Pulse: 65  Resp: 20  Temp: (!) 97.5 F (36.4 C)  TempSrc: Tympanic  SpO2: 99%  Weight: 185 lb 11.2 oz (84.2 kg)   Physical Exam Constitutional:      General: She is not in acute distress. Cardiovascular:     Rate and Rhythm: Normal rate and regular rhythm.     Heart sounds: Normal heart sounds.  Pulmonary:     Effort: Pulmonary effort is normal.     Breath sounds: Normal breath sounds.  Skin:    General: Skin is warm and dry.  Neurological:     Mental Status: She is alert and oriented to person, place, and time.      CMP Latest Ref Rng & Units 08/24/2018  Glucose 70 - 99 mg/dL 191(Y)  BUN 6 - 20 mg/dL 9  Creatinine 7.82 - 9.56 mg/dL 2.13  Sodium 086 - 578 mmol/L 139  Potassium 3.5 - 5.1 mmol/L 4.4  Chloride 98 - 111 mmol/L 104  CO2 22  - 32 mmol/L 25  Calcium 8.9 - 10.3 mg/dL 9.3  Total Protein 6.5 - 8.1 g/dL -  Total Bilirubin 0.3 - 1.2 mg/dL -  Alkaline Phos 38 - 469 U/L -  AST 15 - 41 U/L -  ALT 0 - 44 U/L -   CBC Latest Ref Rng & Units 06/16/2020  WBC 4.0 - 10.5 K/uL 10.6(H)  Hemoglobin 12.0 - 15.0 g/dL 15.3(H)  Hematocrit 36.0 - 46.0 % 46.2(H)  Platelets 150 - 400 K/uL 263     Assessment and plan- Patient is a 60 y.o. female with history of iron deficiency anemia and cerebral venous sinus thrombosis here for routine follow-up  Iron deficiency anemia:Patient is not presently anemic in fact her hemoglobin is borderline high at 15.3/46.2 despite a low ferritin level of 19.  Iron studies are indicative of iron deficiency given her elevated TIBC.  She does not require any IV iron at this time given that her hemoglobin is at the upper limit of normal.  History of cerebral venous sinus thrombosis: Currently patient is on Eliquis.  Given that she has mild polycythemia in the setting of iron deficiency I would like to check a JAK2 mutation at her next Lab in 6 months.  I will see her back in 6 months   Visit Diagnosis 1. Iron deficiency anemia, unspecified iron deficiency anemia type   2. Chronic anticoagulation      Dr. Owens Shark, MD, MPH [facility] [phone] 06/20/2020 6:54 PM

## 2020-07-03 ENCOUNTER — Other Ambulatory Visit: Payer: Self-pay

## 2020-07-19 MED FILL — Amlodipine Besylate Tab 5 MG (Base Equivalent): ORAL | 90 days supply | Qty: 90 | Fill #0 | Status: AC

## 2020-07-20 ENCOUNTER — Other Ambulatory Visit: Payer: Self-pay

## 2020-07-20 ENCOUNTER — Encounter: Payer: Self-pay | Admitting: Oncology

## 2020-08-19 ENCOUNTER — Other Ambulatory Visit: Payer: Self-pay

## 2020-08-25 ENCOUNTER — Ambulatory Visit: Payer: Self-pay | Admitting: Gerontology

## 2020-08-26 ENCOUNTER — Ambulatory Visit: Payer: Self-pay | Admitting: Gerontology

## 2020-09-02 ENCOUNTER — Other Ambulatory Visit: Payer: Self-pay

## 2020-09-02 DIAGNOSIS — Z0000 Encounter for general adult medical examination without abnormal findings: Secondary | ICD-10-CM

## 2020-09-02 DIAGNOSIS — D509 Iron deficiency anemia, unspecified: Secondary | ICD-10-CM

## 2020-09-02 NOTE — Progress Notes (Unsigned)
iron

## 2020-09-03 LAB — CBC WITH DIFFERENTIAL/PLATELET
Basophils Absolute: 0.1 10*3/uL (ref 0.0–0.2)
Basos: 1 %
EOS (ABSOLUTE): 0.2 10*3/uL (ref 0.0–0.4)
Eos: 3 %
Hematocrit: 45.9 % (ref 34.0–46.6)
Hemoglobin: 16 g/dL — ABNORMAL HIGH (ref 11.1–15.9)
Immature Grans (Abs): 0 10*3/uL (ref 0.0–0.1)
Immature Granulocytes: 0 %
Lymphocytes Absolute: 1.7 10*3/uL (ref 0.7–3.1)
Lymphs: 22 %
MCH: 31.3 pg (ref 26.6–33.0)
MCHC: 34.9 g/dL (ref 31.5–35.7)
MCV: 90 fL (ref 79–97)
Monocytes Absolute: 0.4 10*3/uL (ref 0.1–0.9)
Monocytes: 5 %
Neutrophils Absolute: 5.2 10*3/uL (ref 1.4–7.0)
Neutrophils: 69 %
Platelets: 281 10*3/uL (ref 150–450)
RBC: 5.12 x10E6/uL (ref 3.77–5.28)
RDW: 12.3 % (ref 11.7–15.4)
WBC: 7.6 10*3/uL (ref 3.4–10.8)

## 2020-09-03 LAB — LIPID PANEL
Chol/HDL Ratio: 3.9 ratio (ref 0.0–4.4)
Cholesterol, Total: 263 mg/dL — ABNORMAL HIGH (ref 100–199)
HDL: 67 mg/dL (ref >39)
LDL Chol Calc (NIH): 179 mg/dL — ABNORMAL HIGH (ref 0–99)
Triglycerides: 98 mg/dL (ref 0–149)
VLDL Cholesterol Cal: 17 mg/dL (ref 5–40)

## 2020-09-03 LAB — URINALYSIS
Bilirubin, UA: NEGATIVE
Glucose, UA: NEGATIVE
Ketones, UA: NEGATIVE
Leukocytes,UA: NEGATIVE
Nitrite, UA: NEGATIVE
Protein,UA: NEGATIVE
Specific Gravity, UA: 1.014 (ref 1.005–1.030)
Urobilinogen, Ur: 0.2 mg/dL (ref 0.2–1.0)
pH, UA: 6.5 (ref 5.0–7.5)

## 2020-09-03 LAB — COMPREHENSIVE METABOLIC PANEL
ALT: 11 IU/L (ref 0–32)
AST: 15 IU/L (ref 0–40)
Albumin/Globulin Ratio: 1.6 (ref 1.2–2.2)
Albumin: 4.6 g/dL (ref 3.8–4.9)
Alkaline Phosphatase: 103 IU/L (ref 44–121)
BUN/Creatinine Ratio: 17 (ref 9–23)
BUN: 10 mg/dL (ref 6–24)
Bilirubin Total: 0.5 mg/dL (ref 0.0–1.2)
CO2: 24 mmol/L (ref 20–29)
Calcium: 9.5 mg/dL (ref 8.7–10.2)
Chloride: 100 mmol/L (ref 96–106)
Creatinine, Ser: 0.58 mg/dL (ref 0.57–1.00)
Globulin, Total: 2.8 g/dL (ref 1.5–4.5)
Glucose: 84 mg/dL (ref 65–99)
Potassium: 4 mmol/L (ref 3.5–5.2)
Sodium: 141 mmol/L (ref 134–144)
Total Protein: 7.4 g/dL (ref 6.0–8.5)
eGFR: 105 mL/min/{1.73_m2} (ref >59)

## 2020-09-03 LAB — HEMOGLOBIN A1C
Est. average glucose Bld gHb Est-mCnc: 108 mg/dL
Hgb A1c MFr Bld: 5.4 % (ref 4.8–5.6)

## 2020-09-03 LAB — IRON,TIBC AND FERRITIN PANEL
Ferritin: 34 ng/mL (ref 15–150)
Iron Saturation: 26 % (ref 15–55)
Iron: 99 ug/dL (ref 27–159)
Total Iron Binding Capacity: 386 ug/dL (ref 250–450)
UIBC: 287 ug/dL (ref 131–425)

## 2020-09-06 ENCOUNTER — Other Ambulatory Visit: Payer: Self-pay | Admitting: Oncology

## 2020-09-07 ENCOUNTER — Other Ambulatory Visit: Payer: Self-pay

## 2020-09-07 ENCOUNTER — Encounter: Payer: Self-pay | Admitting: Oncology

## 2020-09-07 MED ORDER — RIVAROXABAN 20 MG PO TABS
ORAL_TABLET | ORAL | 3 refills | Status: DC
  Filled 2020-09-07: qty 90, fill #0
  Filled 2020-09-16: qty 90, 90d supply, fill #0
  Filled 2020-12-17: qty 90, 90d supply, fill #1
  Filled 2021-03-15: qty 90, 90d supply, fill #2
  Filled 2021-06-14: qty 90, 90d supply, fill #3

## 2020-09-08 ENCOUNTER — Other Ambulatory Visit: Payer: Self-pay

## 2020-09-08 ENCOUNTER — Encounter: Payer: Self-pay | Admitting: Gerontology

## 2020-09-08 ENCOUNTER — Ambulatory Visit: Payer: Self-pay | Admitting: Gerontology

## 2020-09-08 VITALS — BP 124/84 | HR 65 | Temp 99.1°F | Resp 16 | Ht 63.0 in | Wt 187.7 lb

## 2020-09-08 DIAGNOSIS — E785 Hyperlipidemia, unspecified: Secondary | ICD-10-CM

## 2020-09-08 DIAGNOSIS — Z0000 Encounter for general adult medical examination without abnormal findings: Secondary | ICD-10-CM | POA: Insufficient documentation

## 2020-09-08 DIAGNOSIS — I10 Essential (primary) hypertension: Secondary | ICD-10-CM

## 2020-09-08 MED ORDER — AMLODIPINE BESYLATE 5 MG PO TABS
ORAL_TABLET | Freq: Every day | ORAL | 1 refills | Status: DC
Start: 2020-09-08 — End: 2021-03-10
  Filled 2020-09-08: qty 90, fill #0
  Filled 2020-10-20: qty 90, 90d supply, fill #0
  Filled 2021-01-18: qty 90, 90d supply, fill #1

## 2020-09-08 NOTE — Progress Notes (Signed)
Established Patient Office Visit  Subjective:  Patient ID: Jasmine [name], female    DOB: 04-12-1960  Age: 60 y.o. MRN: [id number]  CC:  Chief Complaint  Patient presents with   Follow-up    Labs done 09/02/20   Hypertension    Patient has been taking blood pressures at home and have been running 130s/70s. Patient is taking HTN meds as prescribed.    HPI Jasmine [name] is a 60 year old female who has history of anemia, arthritis, cerebral venous sinus thrombosis, kidney stones , hypertension, hyperlipidemia presents for routine follow-up visit, lab review and medication refill.  She states that she is compliant with her medications, denies any side effects and continues to make healthy lifestyle changes.  She checks her blood pressure at home and she states that it runs in the low 130s/70s.  Her lipid panel done on 09/02/2020, her total cholesterol was 263 mg per DL and her LDL increased from 146 to 179 mg per DL.  She states that she will make dietary changes.  Her urinalysis had 1+ RBC, she continues on Xarelto, denies hematuria, hematochezia and active bleeding.  Her other Labs were unremarkable.  Overall, she states that she is doing well and offers no further complaint.  Past Medical History:  Diagnosis Date   Anemia    Arthritis    LEFT SHOULDER   H/O cerebral venous sinus thrombosis 2013   History of hiatal hernia    History of kidney stones     Past Surgical History:  Procedure Laterality Date   COLONOSCOPY WITH PROPOFOL N/A 07/02/2018   Procedure: COLONOSCOPY WITH PROPOFOL;  Surgeon: Virgel Manifold, MD;  Location: [facility] ENDOSCOPY;  Service: Endoscopy;  Laterality: N/A;   ENTEROSCOPY N/A 07/17/2018   Procedure: ENTEROSCOPY;  Surgeon: Jonathon Bellows, MD;  [facility] ENDOSCOPY;  Service: Gastroenterology;  Laterality: N/A;   ESOPHAGOGASTRODUODENOSCOPY N/A 2001   approximately- for Reflux   ESOPHAGOGASTRODUODENOSCOPY (EGD) WITH PROPOFOL N/A 07/02/2018   Procedure:  ESOPHAGOGASTRODUODENOSCOPY (EGD) WITH PROPOFOL;  Surgeon: Virgel Manifold, MD;  Location: [facility] ENDOSCOPY;  Service: Endoscopy;  Laterality: N/A;   ESOPHAGOGASTRODUODENOSCOPY (EGD) WITH PROPOFOL N/A 07/11/2018   Procedure: ESOPHAGOGASTRODUODENOSCOPY (EGD) WITH PROPOFOL;  Surgeon: Doran Stabler, MD;  Location: [place];  Service: Gastroenterology;  Laterality: N/A;  PT TO HAVE CAPSULE PLACEMENT   GIVENS CAPSULE STUDY N/A 07/11/2018   Procedure: GIVENS CAPSULE STUDY;  Surgeon: Doran Stabler, MD;  Location: [place];  Service: Gastroenterology;  Laterality: N/A;   HIATAL HERNIA REPAIR  08/23/2018   XI ROBOTIC ASSISTED HIATAL HERNIA REPAIR  WITH FUNDOPLICATION    Family History  Problem Relation Age of Onset   Breast cancer Mother    Bone cancer Mother    Obesity Father    Diabetes Brother    CAD Brother 45       CABG x 4   Cancer Paternal Grandmother    Alcohol abuse Paternal Grandfather     Social History   Socioeconomic History   Marital status: Single    Spouse name: Not on file   Number of children: Not on file   Years of education: Not on file   Highest education level: Not on file  Occupational History   Not on file  Tobacco Use   Smoking status: Never   Smokeless tobacco: Never  Vaping Use   Vaping Use: Never used  Substance and Sexual Activity   Alcohol use: Yes    Comment: occasional  Drug use: Yes    Types: Marijuana    Comment: occasional   Sexual activity: Not Currently  Other Topics Concern   Not on file  Social History Narrative   Lives at home with her parents.  Independent at baseline.   Social Determinants of Health   Financial Resource Strain: Not on file  Food Insecurity: No Food Insecurity   Worried About Charity fundraiser in the Last Year: Never true   Ran Out of Food in the Last Year: Never true  Transportation Needs: No Transportation Needs   Lack of Transportation (Medical): No   Lack of Transportation (Non-Medical):  No  Physical Activity: Not on file  Stress: Not on file  Social Connections: Not on file  Intimate Partner Violence: Not on file    Outpatient Medications Prior to Visit  Medication Sig Dispense Refill   acetaminophen (TYLENOL) 500 MG tablet Take 500 mg by mouth as needed.     rivaroxaban (XARELTO) 20 MG TABS tablet TAKE ONE TABLET BY MOUTH EVERY DAY AT 8:00PM 90 tablet 3   vitamin B-12 (CYANOCOBALAMIN) 500 MCG tablet Take 500 mcg by mouth daily.     amLODipine (NORVASC) 5 MG tablet TAKE ONE TABLET BY MOUTH EVERY DAY 90 tablet 1   Facility-Administered Medications Prior to Visit  Medication Dose Route Frequency Provider Last Rate Last Admin   cyanocobalamin ((VITAMIN B-12)) injection 1,000 mcg  1,000 mcg Intramuscular Q30 days Sindy Guadeloupe, MD   1,000 mcg at 10/09/18 1116    Allergies  Allergen Reactions   Food Other (See Comments)    Pumpkins/squash/zucchini (gourds foods) swelling of the face   Tape Itching, Dermatitis, Rash and Other (See Comments)    Transpore tape   Povidone Iodine    Codeine Other (See Comments)    Crawling skin   Shellfish Allergy Other (See Comments)    Crawling skin/stomach cramps.    ROS Review of Systems  Constitutional: Negative.   Eyes: Negative.   Respiratory: Negative.    Cardiovascular: Negative.   Gastrointestinal: Negative.   Skin: Negative.   Neurological: Negative.   Hematological: Negative.   Psychiatric/Behavioral: Negative.       Objective:    Physical Exam HENT:     Head: Normocephalic and atraumatic.     Mouth/Throat:     Mouth: Mucous membranes are moist.  Eyes:     Extraocular Movements: Extraocular movements intact.     Conjunctiva/sclera: Conjunctivae normal.     Pupils: Pupils are equal, round, and reactive to light.  Cardiovascular:     Rate and Rhythm: Normal rate and regular rhythm.     Pulses: Normal pulses.     Heart sounds: Normal heart sounds.  Pulmonary:     Effort: Pulmonary effort is normal.      Breath sounds: Normal breath sounds.  Skin:    General: Skin is warm.  Neurological:     General: No focal deficit present.     Mental Status: She is alert and oriented to person, place, and time. Mental status is at baseline.  Psychiatric:        Mood and Affect: Mood normal.        Behavior: Behavior normal.        Thought Content: Thought content normal.        Judgment: Judgment normal.    BP 124/84 (BP Location: Left Arm, Patient Position: Sitting, Cuff Size: Normal)   Pulse 65   Temp 99.1 F (37.3 C)  Resp 16   Ht '5\' 3"'  (1.6 m)   Wt 187 lb 11.2 oz (85.1 kg)   SpO2 97%   BMI 33.25 kg/m  Wt Readings from Last 3 Encounters:  09/08/20 187 lb 11.2 oz (85.1 kg)  09/02/20 187 lb 4.8 oz (85 kg)  06/16/20 185 lb 11.2 oz (84.2 kg)   Encouraged weight loss  Health Maintenance Due  Topic Date Due   COVID-19 Vaccine (1) Never done   Pneumococcal Vaccine 93-14 Years old (1 - PCV) Never done   Hepatitis C Screening  Never done   TETANUS/TDAP  Never done   Zoster Vaccines- Shingrix (1 of 2) Never done   PAP SMEAR-Modifier  Never done   MAMMOGRAM  Never done    There are no preventive care reminders to display for this patient.  Lab Results  Component Value Date   TSH 2.300 02/20/2019   Lab Results  Component Value Date   WBC 7.6 09/02/2020   HGB 16.0 (H) 09/02/2020   HCT 45.9 09/02/2020   MCV 90 09/02/2020   PLT 281 09/02/2020   Lab Results  Component Value Date   NA 141 09/02/2020   K 4.0 09/02/2020   CO2 24 09/02/2020   GLUCOSE 84 09/02/2020   BUN 10 09/02/2020   CREATININE 0.58 09/02/2020   BILITOT 0.5 09/02/2020   ALKPHOS 103 09/02/2020   AST 15 09/02/2020   ALT 11 09/02/2020   PROT 7.4 09/02/2020   ALBUMIN 4.6 09/02/2020   CALCIUM 9.5 09/02/2020   ANIONGAP 10 08/24/2018   EGFR 105 09/02/2020   Lab Results  Component Value Date   CHOL 263 (H) 09/02/2020   Lab Results  Component Value Date   HDL 67 09/02/2020   Lab Results  Component Value  Date   LDLCALC 179 (H) 09/02/2020   Lab Results  Component Value Date   TRIG 98 09/02/2020   Lab Results  Component Value Date   CHOLHDL 3.9 09/02/2020   Lab Results  Component Value Date   HGBA1C 5.4 09/02/2020      Assessment & Plan:    1. Essential hypertension -Her blood pressure is under control and she will continue on current medication regimen, DASH diet and exercise as tolerated. - amLODipine (NORVASC) 5 MG tablet; TAKE ONE TABLET BY MOUTH EVERY DAY  Dispense: 90 tablet; Refill: 1  2. Elevated lipids -The 10-year ASCVD risk score Mikey Bussing DC Jr., et al., 2013) is: 3.7%   Values used to calculate the score:     Age: 75 years     Sex: Female     Is Non-Hispanic African American: No     Diabetic: No     Tobacco smoker: No     Systolic Blood Pressure: 850 mmHg     Is BP treated: Yes     HDL Cholesterol: 67 mg/dL     Total Cholesterol: 263 mg/dL -Her ASCVD risk was 3.7%, she was advised to continue on low-fat/cholesterol diet and exercise as tolerated - Lipid panel; Future  3. Health care maintenance -She was advised to notify the clinic for hematuria, hematochezia and active bleeding or go to the emergency room.  We will recheck urinalysis in future. -She was provided with the [facility] flyer to schedule for mammogram and Pap smear.     Follow-up: Return in about 6 months (around 03/10/2021), or if symptoms worsen or fail to improve.    Chike Farrington Jerold Coombe, NP

## 2020-09-08 NOTE — Patient Instructions (Signed)
Heart-Healthy Eating Plan Many factors influence your heart (coronary) health, including eating and exercise habits. Coronary risk increases with abnormal blood fat (lipid) levels. Heart-healthy meal planning includes limiting unhealthy fats,increasing healthy fats, and making other diet and lifestyle changes. What is my plan? Your health care provider may recommend that you: Limit your fat intake to _________% or less of your total calories each day. Limit your saturated fat intake to _________% or less of your total calories each day. Limit the amount of cholesterol in your diet to less than _________ mg per day. What are tips for following this plan? Cooking Cook foods using methods other than frying. Baking, boiling, grilling, and broiling are all good options. Other ways to reduce fat include: Removing the skin from poultry. Removing all visible fats from meats. Steaming vegetables in water or broth. Meal planning  At meals, imagine dividing your plate into fourths: Fill one-half of your plate with vegetables and green salads. Fill one-fourth of your plate with whole grains. Fill one-fourth of your plate with lean protein foods. Eat 4-5 servings of vegetables per day. One serving equals 1 cup raw or cooked vegetable, or 2 cups raw leafy greens. Eat 4-5 servings of fruit per day. One serving equals 1 medium whole fruit,  cup dried fruit,  cup fresh, frozen, or canned fruit, or  cup 100% fruit juice. Eat more foods that contain soluble fiber. Examples include apples, broccoli, carrots, beans, peas, and barley. Aim to get 25-30 g of fiber per day. Increase your consumption of legumes, nuts, and seeds to 4-5 servings per week. One serving of dried beans or legumes equals  cup cooked, 1 serving of nuts is  cup, and 1 serving of seeds equals 1 tablespoon.  Fats Choose healthy fats more often. Choose monounsaturated and polyunsaturated fats, such as olive and canola oils, flaxseeds,  walnuts, almonds, and seeds. Eat more omega-3 fats. Choose salmon, mackerel, sardines, tuna, flaxseed oil, and ground flaxseeds. Aim to eat fish at least 2 times each week. Check food labels carefully to identify foods with trans fats or high amounts of saturated fat. Limit saturated fats. These are found in animal products, such as meats, butter, and cream. Plant sources of saturated fats include palm oil, palm kernel oil, and coconut oil. Avoid foods with partially hydrogenated oils in them. These contain trans fats. Examples are stick margarine, some tub margarines, cookies, crackers, and other baked goods. Avoid fried foods. General information Eat more home-cooked food and less restaurant, buffet, and fast food. Limit or avoid alcohol. Limit foods that are high in starch and sugar. Lose weight if you are overweight. Losing just 5-10% of your body weight can help your overall health and prevent diseases such as diabetes and heart disease. Monitor your salt (sodium) intake, especially if you have high blood pressure. Talk with your health care provider about your sodium intake. Try to incorporate more vegetarian meals weekly. What foods can I eat? Fruits All fresh, canned (in natural juice), or frozen fruits. Vegetables Fresh or frozen vegetables (raw, steamed, roasted, or grilled). Green salads. Grains Most grains. Choose whole wheat and whole grains most of the time. Rice andpasta, including brown rice and pastas made with whole wheat. Meats and other proteins Lean, well-trimmed beef, veal, pork, and lamb. Chicken and turkey without skin. All fish and shellfish. Wild duck, rabbit, pheasant, and venison. Egg whites or low-cholesterol egg substitutes. Dried beans, peas, lentils, and tofu. Seedsand most nuts. Dairy Low-fat or nonfat cheeses, including ricotta   and mozzarella. Skim or 1% milk (liquid, powdered, or evaporated). Buttermilk made with low-fat milk. Nonfat orlow-fat yogurt. Fats  and oils Non-hydrogenated (trans-free) margarines. Vegetable oils, including soybean, sesame, sunflower, olive, peanut, safflower, corn, canola, and cottonseed. Salad dressings or mayonnaisemade with a vegetable oil. Beverages Water (mineral or sparkling). Coffee and tea. Diet carbonated beverages. Sweets and desserts Sherbet, gelatin, and fruit ice. Small amounts of dark chocolate. Limit all sweets and desserts. Seasonings and condiments All seasonings and condiments. The items listed above may not be a complete list of foods and beverages you can eat. Contact a dietitian for more options. What foods are not recommended? Fruits Canned fruit in heavy syrup. Fruit in cream or butter sauce. Fried fruit. Limitcoconut. Vegetables Vegetables cooked in cheese, cream, or butter sauce. Fried vegetables. Grains Breads made with saturated or trans fats, oils, or whole milk. Croissants. Sweet rolls. Donuts. High-fat crackers,such as cheese crackers. Meats and other proteins Fatty meats, such as hot dogs, ribs, sausage, bacon, rib-eye roast or steak. High-fat deli meats, such as salami and bologna. Caviar. Domestic duck andgoose. Organ meats, such as liver. Dairy Cream, sour cream, cream cheese, and creamed cottage cheese. Whole milk cheeses. Whole or 2% milk (liquid, evaporated, or condensed). Whole buttermilk.Cream sauce or high-fat cheese sauce. Whole-milk yogurt. Fats and oils Meat fat, or shortening. Cocoa butter, hydrogenated oils, palm oil, coconut oil, palm kernel oil. Solid fats and shortenings, including bacon fat, salt pork, lard, and butter. Nondairy cream substitutes. Salad dressings with cheeseor sour cream. Beverages Regular sodas and any drinks with added sugar. Sweets and desserts Frosting. Pudding. Cookies. Cakes. Pies. Milk chocolate or white chocolate.Buttered syrups. Full-fat ice cream or ice cream drinks. The items listed above may not be a complete list of foods and beverages to  avoid. Contact a dietitian for more information. Summary Heart-healthy meal planning includes limiting unhealthy fats, increasing healthy fats, and making other diet and lifestyle changes. Lose weight if you are overweight. Losing just 5-10% of your body weight can help your overall health and prevent diseases such as diabetes and heart disease. Focus on eating a balance of foods, including fruits and vegetables, low-fat or nonfat dairy, lean protein, nuts and legumes, whole grains, and heart-healthy oils and fats. This information is not intended to replace advice given to you by your health care provider. Make sure you discuss any questions you have with your healthcare provider. Document Revised: 03/10/2017 Document Reviewed: 03/10/2017 Elsevier Patient Education  2022 [employer].  

## 2020-09-16 ENCOUNTER — Encounter: Payer: Self-pay | Admitting: Oncology

## 2020-09-16 ENCOUNTER — Other Ambulatory Visit: Payer: Self-pay

## 2020-10-15 ENCOUNTER — Other Ambulatory Visit: Payer: Self-pay

## 2020-10-20 ENCOUNTER — Encounter: Payer: Self-pay | Admitting: Oncology

## 2020-10-20 ENCOUNTER — Other Ambulatory Visit: Payer: Self-pay

## 2020-10-27 ENCOUNTER — Encounter: Payer: Self-pay | Admitting: Oncology

## 2020-10-27 ENCOUNTER — Other Ambulatory Visit: Payer: Self-pay

## 2020-10-27 MED ORDER — ONDANSETRON 4 MG PO TBDP
ORAL_TABLET | ORAL | 0 refills | Status: DC
  Filled 2020-10-27: qty 14, 7d supply, fill #0

## 2020-10-27 MED ORDER — MECLIZINE HCL 25 MG PO TABS
25.0000 mg | ORAL_TABLET | Freq: Three times a day (TID) | ORAL | 0 refills | Status: DC | PRN
  Filled 2020-10-27: qty 30, 10d supply, fill #0

## 2020-12-09 ENCOUNTER — Telehealth: Payer: Self-pay | Admitting: Nurse Practitioner

## 2020-12-09 ENCOUNTER — Telehealth: Payer: Self-pay | Admitting: Oncology

## 2020-12-09 NOTE — Telephone Encounter (Signed)
Patient called in to cancel her appt..Needs to reschedule. Please call at [phone]

## 2020-12-09 NOTE — Telephone Encounter (Signed)
Patient wants to reschedule her appt with lab . Please give her a call at [phone]. Only provider is Consuello Masse

## 2020-12-17 ENCOUNTER — Encounter: Payer: Self-pay | Admitting: Oncology

## 2020-12-17 ENCOUNTER — Other Ambulatory Visit: Payer: Self-pay

## 2020-12-18 ENCOUNTER — Ambulatory Visit: Payer: Self-pay | Admitting: Oncology

## 2020-12-18 ENCOUNTER — Other Ambulatory Visit: Payer: Self-pay

## 2020-12-21 ENCOUNTER — Inpatient Hospital Stay: Payer: Self-pay | Attending: Nurse Practitioner

## 2020-12-21 ENCOUNTER — Other Ambulatory Visit: Payer: Self-pay

## 2020-12-21 ENCOUNTER — Inpatient Hospital Stay (HOSPITAL_BASED_OUTPATIENT_CLINIC_OR_DEPARTMENT_OTHER): Payer: Self-pay | Admitting: Nurse Practitioner

## 2020-12-21 VITALS — BP 163/87 | HR 67 | Temp 97.4°F | Resp 16 | Wt 186.4 lb

## 2020-12-21 DIAGNOSIS — Z7901 Long term (current) use of anticoagulants: Secondary | ICD-10-CM | POA: Insufficient documentation

## 2020-12-21 DIAGNOSIS — D751 Secondary polycythemia: Secondary | ICD-10-CM

## 2020-12-21 DIAGNOSIS — D509 Iron deficiency anemia, unspecified: Secondary | ICD-10-CM | POA: Insufficient documentation

## 2020-12-21 DIAGNOSIS — E538 Deficiency of other specified B group vitamins: Secondary | ICD-10-CM | POA: Insufficient documentation

## 2020-12-21 DIAGNOSIS — Z86718 Personal history of other venous thrombosis and embolism: Secondary | ICD-10-CM | POA: Insufficient documentation

## 2020-12-21 LAB — CBC WITH DIFFERENTIAL/PLATELET
Abs Immature Granulocytes: 0.04 10*3/uL (ref 0.00–0.07)
Basophils Absolute: 0 10*3/uL (ref 0.0–0.1)
Basophils Relative: 0 %
Eosinophils Absolute: 0.2 10*3/uL (ref 0.0–0.5)
Eosinophils Relative: 2 %
HCT: 47.2 % — ABNORMAL HIGH (ref 36.0–46.0)
Hemoglobin: 15.6 g/dL — ABNORMAL HIGH (ref 12.0–15.0)
Immature Granulocytes: 0 %
Lymphocytes Relative: 16 %
Lymphs Abs: 1.6 10*3/uL (ref 0.7–4.0)
MCH: 30.8 pg (ref 26.0–34.0)
MCHC: 33.1 g/dL (ref 30.0–36.0)
MCV: 93.1 fL (ref 80.0–100.0)
Monocytes Absolute: 0.3 10*3/uL (ref 0.1–1.0)
Monocytes Relative: 3 %
Neutro Abs: 7.9 10*3/uL — ABNORMAL HIGH (ref 1.7–7.7)
Neutrophils Relative %: 79 %
Platelets: 250 10*3/uL (ref 150–400)
RBC: 5.07 MIL/uL (ref 3.87–5.11)
RDW: 13.2 % (ref 11.5–15.5)
WBC: 10.1 10*3/uL (ref 4.0–10.5)
nRBC: 0 % (ref 0.0–0.2)

## 2020-12-21 LAB — FERRITIN: Ferritin: 28 ng/mL (ref 11–307)

## 2020-12-21 LAB — IRON AND TIBC
Iron: 65 ug/dL (ref 28–170)
Saturation Ratios: 15 % (ref 10.4–31.8)
TIBC: 426 ug/dL (ref 250–450)
UIBC: 361 ug/dL

## 2020-12-21 NOTE — Progress Notes (Signed)
Hematology/Oncology Consult Note [facility]  [phone] [phone]  Patient Care Team: Langston Reusing, NP as PCP - General (Gerontology)   Name of the patient: Jasmine [name]  OP:[id number]  1960/07/28   Date of visit: 12/21/20  Diagnosis-history of iron deficiency anemia and cerebral venous sinus thrombosis  Chief complaint/ Reason for visit-routine follow-up of iron deficiency anemia and cerebral venous sinus thrombosis  Heme/Onc history: patient is a 60 year old Caucasian female with a history of Extensive dural venous sinus thrombosis back in 2013.  She was started on Coumadin at that time.  She had hypercoagulable work-up done including prothrombin gene mutation, protein C, protein S activity, factor V activity and Antithrombin III levels which were unremarkable.  She also had work-up for antiphospholipid antibody syndrome which was negative.  No apparent cause for her dural venous sinus thrombosis was found at that time.  She continues to follow-up with neurology and was seen by them in 2016.  At that time her Coumadin was stopped as repeat MRI showed significant improvement in these findings and patient has not had any current strokes.  She was switched to low-dose aspirin.  She then presented with a second episode of dural venous sinus thrombosis in 2018.  At that time she was started on rivaraoxaban and she continues to be on that.  CBC in September 2018 showed a white count of 14.9, hemoglobin interval 9/30.4 with an MCV of 71.2 and a platelet count of 326 which was lower as compared to her baseline hemoglobin of 14 in 2013.   She now presents to the hospital with right upper quadrant abdominal pain and she was found to have New onset anemia with a hemoglobin of 6.5/25.3 with an MCV of 68.9 and was hence admitted.  B12 levels were low at 100.  Ferritin levels were low at 2 and iron studies showed elevated TIBC of 580 with a low iron  saturation of 7%.  Folate levels were normal.   egd colonoscopy and capsule study were unremarkable except for large hiatal hernia which was likely the cause of her iron deficiency anemia. She underwent  Nissen fundoplication in July [id number]  Interval history-patient is a 60 year old female currently on Eliquis for history of DVT who returns to clinic for labs and follow-up.  Continues to tolerate Eliquis without significant side effects.  No abnormal bleeding. Some easy bruising. Has not noticed blood in her urine or stool.  No gum bleeding or nosebleeds.  ECOG PS- 0 Pain scale- 0   Review of systems- Review of Systems  Constitutional:  Negative for chills, fever, malaise/fatigue and weight loss.  HENT:  Negative for congestion, ear discharge and nosebleeds.   Eyes:  Negative for blurred vision.  Respiratory:  Negative for cough, hemoptysis, sputum production, shortness of breath and wheezing.   Cardiovascular:  Negative for chest pain, palpitations, orthopnea and claudication.  Gastrointestinal:  Negative for abdominal pain, blood in stool, constipation, diarrhea, heartburn, melena, nausea and vomiting.  Genitourinary:  Negative for dysuria, flank pain, frequency, hematuria and urgency.  Musculoskeletal:  Negative for back pain, joint pain and myalgias.  Skin:  Negative for rash.  Neurological:  Negative for dizziness, tingling, focal weakness, seizures, weakness and headaches.  Endo/Heme/Allergies:  Does not bruise/bleed easily.  Psychiatric/Behavioral:  Negative for depression and suicidal ideas. The patient does not have insomnia.      Allergies  Allergen Reactions   Food Other (See Comments)    Pumpkins/squash/zucchini (gourds foods) swelling  of the face   Tape Itching, Dermatitis, Rash and Other (See Comments)    Transpore tape   Povidone Iodine    Codeine Other (See Comments)    Crawling skin   Shellfish Allergy Other (See Comments)    Crawling skin/stomach cramps.      Past Medical History:  Diagnosis Date   Anemia    Arthritis    LEFT SHOULDER   H/O cerebral venous sinus thrombosis 2013   History of hiatal hernia    History of kidney stones      Past Surgical History:  Procedure Laterality Date   COLONOSCOPY WITH PROPOFOL N/A 07/02/2018   Procedure: COLONOSCOPY WITH PROPOFOL;  Surgeon: Pasty Spillers, MD;  Location: [facility] ENDOSCOPY;  Service: Endoscopy;  Laterality: N/A;   ENTEROSCOPY N/A 07/17/2018   Procedure: ENTEROSCOPY;  Surgeon: Wyline Mood, MD;  Location: [facility] ENDOSCOPY;  Service: Gastroenterology;  Laterality: N/A;   ESOPHAGOGASTRODUODENOSCOPY N/A 2001   approximately- for Reflux   ESOPHAGOGASTRODUODENOSCOPY (EGD) WITH PROPOFOL N/A 07/02/2018   Procedure: ESOPHAGOGASTRODUODENOSCOPY (EGD) WITH PROPOFOL;  Surgeon: Pasty Spillers, MD;  Location: [facility] ENDOSCOPY;  Service: Endoscopy;  Laterality: N/A;   ESOPHAGOGASTRODUODENOSCOPY (EGD) WITH PROPOFOL N/A 07/11/2018   Procedure: ESOPHAGOGASTRODUODENOSCOPY (EGD) WITH PROPOFOL;  Surgeon: Sherrilyn Rist, MD;  Location: [facility] ENDOSCOPY;  Service: Gastroenterology;  Laterality: N/A;  PT TO HAVE CAPSULE PLACEMENT   GIVENS CAPSULE STUDY N/A 07/11/2018   Procedure: GIVENS CAPSULE STUDY;  Surgeon: Sherrilyn Rist, MD;  Location: [facility] ENDOSCOPY;  Service: Gastroenterology;  Laterality: N/A;   HIATAL HERNIA REPAIR  08/23/2018   XI ROBOTIC ASSISTED HIATAL HERNIA REPAIR  WITH FUNDOPLICATION    Social History   Socioeconomic History   Marital status: Single    Spouse name: Not on file   Number of children: Not on file   Years of education: Not on file   Highest education level: Not on file  Occupational History   Not on file  Tobacco Use   Smoking status: Never   Smokeless tobacco: Never  Vaping Use   Vaping Use: Never used  Substance and Sexual Activity   Alcohol use: Yes    Comment: occasional   Drug use: Yes    Types: Marijuana    Comment: occasional   Sexual activity: Not  Currently  Other Topics Concern   Not on file  Social History Narrative   Lives at home with her parents.  Independent at baseline.   Social Determinants of Health   Financial Resource Strain: Not on file  Food Insecurity: No Food Insecurity   Worried About Programme researcher, broadcasting/film/video in the Last Year: Never true   Ran Out of Food in the Last Year: Never true  Transportation Needs: No Transportation Needs   Lack of Transportation (Medical): No   Lack of Transportation (Non-Medical): No  Physical Activity: Not on file  Stress: Not on file  Social Connections: Not on file  Intimate Partner Violence: Not on file    Family History  Problem Relation Age of Onset   Breast cancer Mother    Bone cancer Mother    Obesity Father    Diabetes Brother    CAD Brother 43       CABG x 4   Cancer Paternal Grandmother    Alcohol abuse Paternal Grandfather      Current Outpatient Medications:    acetaminophen (TYLENOL) 500 MG tablet, Take 500 mg by mouth as needed., Disp: , Rfl:    amLODipine (  NORVASC) 5 MG tablet, TAKE ONE TABLET BY MOUTH EVERY DAY, Disp: 90 tablet, Rfl: 1   meclizine (ANTIVERT) 25 MG tablet, Take 1 tablet (25 mg total) by mouth 3 (three) times daily as needed., Disp: 30 tablet, Rfl: 0   ondansetron (ZOFRAN-ODT) 4 MG disintegrating tablet, Dissolve 1 tablet by mouth once every 8 hours as needed for nausea for up to 7 days, Disp: 14 tablet, Rfl: 0   rivaroxaban (XARELTO) 20 MG TABS tablet, TAKE ONE TABLET BY MOUTH EVERY DAY AT 8:00PM, Disp: 90 tablet, Rfl: 3   vitamin B-12 (CYANOCOBALAMIN) 500 MCG tablet, Take 500 mcg by mouth daily., Disp: , Rfl:  No current facility-administered medications for this visit.  Facility-Administered Medications Ordered in Other Visits:    cyanocobalamin ((VITAMIN B-12)) injection 1,000 mcg, 1,000 mcg, Intramuscular, Q30 days, Sindy Guadeloupe, MD, 1,000 mcg at 10/09/18 1116  Physical exam:  Vitals:   12/21/20 1031  BP: (!) 163/87  Pulse: 67   Resp: 16  Temp: (!) 97.4 F (36.3 C)  SpO2: 100%  Weight: 186 lb 6.4 oz (84.6 kg)   Physical Exam Constitutional:      General: She is not in acute distress. Cardiovascular:     Rate and Rhythm: Normal rate and regular rhythm.     Heart sounds: Normal heart sounds.  Pulmonary:     Effort: Pulmonary effort is normal.     Breath sounds: Normal breath sounds.  Skin:    General: Skin is warm and dry.  Neurological:     Mental Status: She is alert and oriented to person, place, and time.     CMP Latest Ref Rng & Units 09/02/2020  Glucose 65 - 99 mg/dL 84  BUN 6 - 24 mg/dL 10  Creatinine 0.57 - 1.00 mg/dL 0.58  Sodium 134 - 144 mmol/L 141  Potassium 3.5 - 5.2 mmol/L 4.0  Chloride 96 - 106 mmol/L 100  CO2 20 - 29 mmol/L 24  Calcium 8.7 - 10.2 mg/dL 9.5  Total Protein 6.0 - 8.5 g/dL 7.4  Total Bilirubin 0.0 - 1.2 mg/dL 0.5  Alkaline Phos 44 - 121 IU/L 103  AST 0 - 40 IU/L 15  ALT 0 - 32 IU/L 11   CBC Latest Ref Rng & Units 09/02/2020  WBC 3.4 - 10.8 x10E3/uL 7.6  Hemoglobin 11.1 - 15.9 g/dL 16.0(H)  Hematocrit 34.0 - 46.6 % 45.9  Platelets 150 - 450 x10E3/uL 281   Assessment and plan- Patient is a 60 y.o. female with history of iron deficiency anemia and cerebral venous sinus thrombosis here for routine follow-up  Iron deficiency anemia- Hemoglobin 15.6. Ferritin 28. Iron saturation 15%. Iron saturation normal. Previously, iron studies have been indicative of iron deficiency given her elevated TIBC. However, given that her hemoglobin has been greater than the upper limits of normal, iron was held. Polycythemia- etiology unclear. JAK2 testing pending.  Hx of cerebral venous sinus thrombosis- currently on eliquis.   RTC in 6 months for labs (cbc, ferritin, iron and tibc), Dr Janese Banks- [place]   Visit Diagnosis 1. Iron deficiency anemia, unspecified iron deficiency anemia type   2. Polycythemia    Beckey Rutter, DNP, AGNP-C [place] at [facility] [phone]  (clinic) 12/21/2020

## 2020-12-21 NOTE — Progress Notes (Signed)
Pt has no concerns/complaints at this time. 

## 2020-12-25 LAB — JAK2 GENOTYPR

## 2021-01-18 ENCOUNTER — Encounter: Payer: Self-pay | Admitting: Oncology

## 2021-01-18 ENCOUNTER — Other Ambulatory Visit: Payer: Self-pay

## 2021-01-22 ENCOUNTER — Other Ambulatory Visit: Payer: Self-pay

## 2021-03-03 ENCOUNTER — Other Ambulatory Visit: Payer: Self-pay

## 2021-03-03 DIAGNOSIS — E785 Hyperlipidemia, unspecified: Secondary | ICD-10-CM

## 2021-03-03 DIAGNOSIS — Z0000 Encounter for general adult medical examination without abnormal findings: Secondary | ICD-10-CM

## 2021-03-05 LAB — URINALYSIS
Bilirubin, UA: NEGATIVE
Glucose, UA: NEGATIVE
Leukocytes,UA: NEGATIVE
Nitrite, UA: NEGATIVE
RBC, UA: NEGATIVE
Specific Gravity, UA: 1.023 (ref 1.005–1.030)
Urobilinogen, Ur: 0.2 mg/dL (ref 0.2–1.0)
pH, UA: 5.5 (ref 5.0–7.5)

## 2021-03-05 LAB — LIPID PANEL
Chol/HDL Ratio: 3.9 ratio (ref 0.0–4.4)
Cholesterol, Total: 254 mg/dL — ABNORMAL HIGH (ref 100–199)
HDL: 65 mg/dL (ref >39)
LDL Chol Calc (NIH): 176 mg/dL — ABNORMAL HIGH (ref 0–99)
Triglycerides: 78 mg/dL (ref 0–149)
VLDL Cholesterol Cal: 13 mg/dL (ref 5–40)

## 2021-03-10 ENCOUNTER — Other Ambulatory Visit: Payer: Self-pay

## 2021-03-10 ENCOUNTER — Ambulatory Visit: Payer: Self-pay | Admitting: Gerontology

## 2021-03-10 ENCOUNTER — Encounter: Payer: Self-pay | Admitting: Oncology

## 2021-03-10 DIAGNOSIS — E785 Hyperlipidemia, unspecified: Secondary | ICD-10-CM

## 2021-03-10 DIAGNOSIS — I10 Essential (primary) hypertension: Secondary | ICD-10-CM

## 2021-03-10 MED ORDER — AMLODIPINE BESYLATE 5 MG PO TABS
ORAL_TABLET | Freq: Every day | ORAL | 1 refills | Status: DC
  Filled 2021-03-10: qty 90, fill #0
  Filled 2021-04-12 – 2021-07-16 (×2): qty 90, 90d supply, fill #0
  Filled 2021-07-16: qty 90, 90d supply, fill #1

## 2021-03-10 MED ORDER — ROSUVASTATIN CALCIUM 5 MG PO TABS
5.0000 mg | ORAL_TABLET | Freq: Every day | ORAL | 0 refills | Status: DC
  Filled 2021-03-10: qty 30, 30d supply, fill #0

## 2021-03-10 NOTE — Progress Notes (Signed)
Established Patient Office Visit  Subjective:  Patient ID: Jasmine [name], female    DOB: [birth year]  Age: 61 y.o. MRN: [id number]  CC: No chief complaint on file.   HPI Jasmine [name] is a 61 year old female who has history of anemia, arthritis, cerebral venous sinus thrombosis, kidney stones , hypertension, hyperlipidemia presents for routine follow-up visit, lab review and medication refill.  She states that she is compliant with her medications, denies any side effects and continues to make healthy lifestyle changes.  She checks her blood pressure at home and she states that it runs in the low 130s/70s.  Her lipid panel done on 03/03/21 ,her total cholesterol was 254 mg per DL and her LDL was 176 mg per DL.  She states that she will make dietary changes. She continues on Xarelto, denies hematuria, hematochezia and active bleeding. Overall, she states that she's doing well and offers no further complaint.    Past Medical History:  Diagnosis Date   Anemia    Arthritis    LEFT SHOULDER   H/O cerebral venous sinus thrombosis 2013   History of hiatal hernia    History of kidney stones     Past Surgical History:  Procedure Laterality Date   COLONOSCOPY WITH PROPOFOL N/A 07/02/2018   Procedure: COLONOSCOPY WITH PROPOFOL;  Surgeon: Virgel Manifold, MD;  Location: [facility] ENDOSCOPY;  Service: Endoscopy;  Laterality: N/A;   ENTEROSCOPY N/A 07/17/2018   Procedure: ENTEROSCOPY;  Surgeon: Jonathon Bellows, MD;  Location: [facility] ENDOSCOPY;  Service: Gastroenterology;  Laterality: N/A;   ESOPHAGOGASTRODUODENOSCOPY N/A 2001   approximately- for Reflux   ESOPHAGOGASTRODUODENOSCOPY (EGD) WITH PROPOFOL N/A 07/02/2018   Procedure: ESOPHAGOGASTRODUODENOSCOPY (EGD) WITH PROPOFOL;  Surgeon: Virgel Manifold, MD;  Location: [facility] ENDOSCOPY;  Service: Endoscopy;  Laterality: N/A;   ESOPHAGOGASTRODUODENOSCOPY (EGD) WITH PROPOFOL N/A 07/11/2018   Procedure: ESOPHAGOGASTRODUODENOSCOPY (EGD) WITH PROPOFOL;   Surgeon: Doran Stabler, MD;  Location: [place];  Service: Gastroenterology;  Laterality: N/A;  PT TO HAVE CAPSULE PLACEMENT   GIVENS CAPSULE STUDY N/A 07/11/2018   Procedure: GIVENS CAPSULE STUDY;  Surgeon: Doran Stabler, MD;  Location: [place];  Service: Gastroenterology;  Laterality: N/A;   HIATAL HERNIA REPAIR  08/23/2018   XI ROBOTIC ASSISTED HIATAL HERNIA REPAIR  WITH FUNDOPLICATION    Family History  Problem Relation Age of Onset   Breast cancer Mother    Bone cancer Mother    Obesity Father    Diabetes Brother    CAD Brother 62       CABG x 4   Cancer Paternal Grandmother    Alcohol abuse Paternal Grandfather     Social History   Socioeconomic History   Marital status: Single    Spouse name: Not on file   Number of children: Not on file   Years of education: Not on file   Highest education level: Not on file  Occupational History   Not on file  Tobacco Use   Smoking status: Never   Smokeless tobacco: Never  Vaping Use   Vaping Use: Never used  Substance and Sexual Activity   Alcohol use: Yes    Comment: occasional   Drug use: Yes    Types: Marijuana    Comment: occasional   Sexual activity: Not Currently  Other Topics Concern   Not on file  Social History Narrative   Lives at home with her parents.  Independent at baseline.   Social Determinants of Health   Financial  Resource Strain: Not on file  Food Insecurity: No Food Insecurity   Worried About Charity fundraiser in the Last Year: Never true   Ran Out of Food in the Last Year: Never true  Transportation Needs: No Transportation Needs   Lack of Transportation (Medical): No   Lack of Transportation (Non-Medical): No  Physical Activity: Not on file  Stress: Not on file  Social Connections: Not on file  Intimate Partner Violence: Not on file    Outpatient Medications Prior to Visit  Medication Sig Dispense Refill   acetaminophen (TYLENOL) 500 MG tablet Take 500 mg by mouth as  needed.     meclizine (ANTIVERT) 25 MG tablet Take 1 tablet (25 mg total) by mouth 3 (three) times daily as needed. (Patient not taking: Reported on 12/21/2020) 30 tablet 0   ondansetron (ZOFRAN-ODT) 4 MG disintegrating tablet Dissolve 1 tablet by mouth once every 8 hours as needed for nausea for up to 7 days (Patient not taking: Reported on 12/21/2020) 14 tablet 0   rivaroxaban (XARELTO) 20 MG TABS tablet TAKE ONE TABLET BY MOUTH EVERY DAY AT 8:00PM 90 tablet 3   vitamin B-12 (CYANOCOBALAMIN) 500 MCG tablet Take 500 mcg by mouth daily.     amLODipine (NORVASC) 5 MG tablet TAKE ONE TABLET BY MOUTH EVERY DAY 90 tablet 1   Facility-Administered Medications Prior to Visit  Medication Dose Route Frequency Provider Last Rate Last Admin   cyanocobalamin ((VITAMIN B-12)) injection 1,000 mcg  1,000 mcg Intramuscular Q30 days Sindy Guadeloupe, MD   1,000 mcg at 10/09/18 1116    Allergies  Allergen Reactions   Food Other (See Comments)    Pumpkins/squash/zucchini (gourds foods) swelling of the face   Tape Itching, Dermatitis, Rash and Other (See Comments)    Transpore tape   Povidone Iodine    Codeine Other (See Comments)    Crawling skin   Shellfish Allergy Other (See Comments)    Crawling skin/stomach cramps.    ROS Review of Systems  Constitutional: Negative.   Eyes: Negative.   Respiratory: Negative.    Cardiovascular: Negative.   Neurological: Negative.   Hematological: Negative.   Psychiatric/Behavioral: Negative.       Objective:    Physical Exam Telephone visit There were no vitals taken for this visit. Wt Readings from Last 3 Encounters:  03/03/21 188 lb 11.2 oz (85.6 kg)  12/21/20 186 lb 6.4 oz (84.6 kg)  09/08/20 187 lb 11.2 oz (85.1 kg)     Health Maintenance Due  Topic Date Due   COVID-19 Vaccine (1) Never done   Hepatitis C Screening  Never done   TETANUS/TDAP  Never done   Zoster Vaccines- Shingrix (1 of 2) Never done   PAP SMEAR-Modifier  Never done    MAMMOGRAM  Never done    There are no preventive care reminders to display for this patient.  Lab Results  Component Value Date   TSH 2.300 02/20/2019   Lab Results  Component Value Date   WBC 10.1 12/21/2020   HGB 15.6 (H) 12/21/2020   HCT 47.2 (H) 12/21/2020   MCV 93.1 12/21/2020   PLT 250 12/21/2020   Lab Results  Component Value Date   NA 141 09/02/2020   K 4.0 09/02/2020   CO2 24 09/02/2020   GLUCOSE 84 09/02/2020   BUN 10 09/02/2020   CREATININE 0.58 09/02/2020   BILITOT 0.5 09/02/2020   ALKPHOS 103 09/02/2020   AST 15 09/02/2020   ALT 11 09/02/2020  PROT 7.4 09/02/2020   ALBUMIN 4.6 09/02/2020   CALCIUM 9.5 09/02/2020   ANIONGAP 10 08/24/2018   EGFR 105 09/02/2020   Lab Results  Component Value Date   CHOL 254 (H) 03/03/2021   Lab Results  Component Value Date   HDL 65 03/03/2021   Lab Results  Component Value Date   LDLCALC 176 (H) 03/03/2021   Lab Results  Component Value Date   TRIG 78 03/03/2021   Lab Results  Component Value Date   CHOLHDL 3.9 03/03/2021   Lab Results  Component Value Date   HGBA1C 5.4 09/02/2020      Assessment & Plan:    1. Essential hypertension - Per patient, her blood pressure is under control, she will continue on current medication, DASH diet and exercise as tolerated. - amLODipine (NORVASC) 5 MG tablet; TAKE ONE TABLET BY MOUTH EVERY DAY  Dispense: 90 tablet; Refill: 1  2. Elevated lipids -The 10-year ASCVD risk score (Arnett DK, et al., 2019) is: 5.3%   Values used to calculate the score:     Age: 43 years     Sex: Female     Is Non-Hispanic African American: No     Diabetic: No     Tobacco smoker: No     Systolic Blood Pressure: 488 mmHg     Is BP treated: Yes     HDL Cholesterol: 65 mg/dL     Total Cholesterol: 254 mg/dL Her ASCVD risk is 5.3%, she was started on 5 mg Rosuvastatin, educated on medication side effects and advised to notify clinic, continue on low fat/cholesterol diet and exercise  as tolerated. - rosuvastatin (CRESTOR) 5 MG tablet; Take 1 tablet (5 mg total) by mouth daily.  Dispense: 30 tablet; Refill: 0     Follow-up: Return in about 29 days (around 04/08/2021), or if symptoms worsen or fail to improve.    Tsugio Elison Jerold Coombe, NP

## 2021-03-10 NOTE — Patient Instructions (Signed)
Heart-Healthy Eating Plan Heart-healthy meal planning includes: Eating less unhealthy fats. Eating more healthy fats. Making other changes in your diet. Talk with your doctor or a diet specialist (dietitian) to create an eating plan that is right for you. What is my plan? Your doctor may recommend an eating plan that includes: Total fat: ______% or less of total calories a day. Saturated fat: ______% or less of total calories a day. Cholesterol: less than _________mg a day. What are tips for following this plan? Cooking Avoid frying your food. Try to bake, boil, grill, or broil it instead. You can also reduce fat by: Removing the skin from poultry. Removing all visible fats from meats. Steaming vegetables in water or broth. Meal planning  At meals, divide your plate into four equal parts: Fill one-half of your plate with vegetables and green salads. Fill one-fourth of your plate with whole grains. Fill one-fourth of your plate with lean protein foods. Eat 4-5 servings of vegetables per day. A serving of vegetables is: 1 cup of raw or cooked vegetables. 2 cups of raw leafy greens. Eat 4-5 servings of fruit per day. A serving of fruit is: 1 medium whole fruit.  cup of dried fruit.  cup of fresh, frozen, or canned fruit.  cup of 100% fruit juice. Eat more foods that have soluble fiber. These are apples, broccoli, carrots, beans, peas, and barley. Try to get 20-30 g of fiber per day. Eat 4-5 servings of nuts, legumes, and seeds per week: 1 serving of dried beans or legumes equals  cup after being cooked. 1 serving of nuts is  cup. 1 serving of seeds equals 1 tablespoon. General information Eat more home-cooked food. Eat less restaurant, buffet, and fast food. Limit or avoid alcohol. Limit foods that are high in starch and sugar. Avoid fried foods. Lose weight if you are overweight. Keep track of how much salt (sodium) you eat. This is important if you have high blood  pressure. Ask your doctor to tell you more about this. Try to add vegetarian meals each week. Fats Choose healthy fats. These include olive oil and canola oil, flaxseeds, walnuts, almonds, and seeds. Eat more omega-3 fats. These include salmon, mackerel, sardines, tuna, flaxseed oil, and ground flaxseeds. Try to eat fish at least 2 times each week. Check food labels. Avoid foods with trans fats or high amounts of saturated fat. Limit saturated fats. These are often found in animal products, such as meats, butter, and cream. These are also found in plant foods, such as palm oil, palm kernel oil, and coconut oil. Avoid foods with partially hydrogenated oils in them. These have trans fats. Examples are stick margarine, some tub margarines, cookies, crackers, and other baked goods. What foods can I eat? Fruits All fresh, canned (in natural juice), or frozen fruits. Vegetables Fresh or frozen vegetables (raw, steamed, roasted, or grilled). Green salads. Grains Most grains. Choose whole wheat and whole grains most of the time. Rice and pasta, including brown rice and pastas made with whole wheat. Meats and other proteins Lean, well-trimmed beef, veal, pork, and lamb. Chicken and turkey without skin. All fish and shellfish. Wild duck, rabbit, pheasant, and venison. Egg whites or low-cholesterol egg substitutes. Dried beans, peas, lentils, and tofu. Seeds and most nuts. Dairy Low-fat or nonfat cheeses, including ricotta and mozzarella. Skim or 1% milk that is liquid, powdered, or evaporated. Buttermilk that is made with low-fat milk. Nonfat or low-fat yogurt. Fats and oils Non-hydrogenated (trans-free) margarines. Vegetable oils, including   soybean, sesame, sunflower, olive, peanut, safflower, corn, canola, and cottonseed. Salad dressings or mayonnaise made with a vegetable oil. Beverages Mineral water. Coffee and tea. Diet carbonated beverages. Sweets and desserts Sherbet, gelatin, and fruit ice.  Small amounts of dark chocolate. Limit all sweets and desserts. Seasonings and condiments All seasonings and condiments. The items listed above may not be a complete list of foods and drinks you can eat. Contact a dietitian for more options. What foods should I avoid? Fruits Canned fruit in heavy syrup. Fruit in cream or butter sauce. Fried fruit. Limit coconut. Vegetables Vegetables cooked in cheese, cream, or butter sauce. Fried vegetables. Grains Breads that are made with saturated or trans fats, oils, or whole milk. Croissants. Sweet rolls. Donuts. High-fat crackers, such as cheese crackers. Meats and other proteins Fatty meats, such as hot dogs, ribs, sausage, bacon, rib-eye roast or steak. High-fat deli meats, such as salami and bologna. Caviar. Domestic duck and goose. Organ meats, such as liver. Dairy Cream, sour cream, cream cheese, and creamed cottage cheese. Whole-milk cheeses. Whole or 2% milk that is liquid, evaporated, or condensed. Whole buttermilk. Cream sauce or high-fat cheese sauce. Yogurt that is made from whole milk. Fats and oils Meat fat, or shortening. Cocoa butter, hydrogenated oils, palm oil, coconut oil, palm kernel oil. Solid fats and shortenings, including bacon fat, salt pork, lard, and butter. Nondairy cream substitutes. Salad dressings with cheese or sour cream. Beverages Regular sodas and juice drinks with added sugar. Sweets and desserts Frosting. Pudding. Cookies. Cakes. Pies. Milk chocolate or white chocolate. Buttered syrups. Full-fat ice cream or ice cream drinks. The items listed above may not be a complete list of foods and drinks to avoid. Contact a dietitian for more information. Summary Heart-healthy meal planning includes eating less unhealthy fats, eating more healthy fats, and making other changes in your diet. Eat a balanced diet. This includes fruits and vegetables, low-fat or nonfat dairy, lean protein, nuts and legumes, whole grains, and  heart-healthy oils and fats. This information is not intended to replace advice given to you by your health care provider. Make sure you discuss any questions you have with your health care provider. Document Revised: 06/11/2020 Document Reviewed: 06/11/2020 Elsevier Patient Education  2022 Else[employer] Inc. DASH Eating Plan DASH stands for Dietary Approaches to Stop Hypertension. The DASH eating plan is a healthy eating plan that has been shown to: Reduce high blood pressure (hypertension). Reduce your risk for type 2 diabetes, heart disease, and stroke. Help with weight loss. What are tips for following this plan? Reading food labels Check food labels for the amount of salt (sodium) per serving. Choose foods with less than 5 percent of the Daily Value of sodium. Generally, foods with less than 300 milligrams (mg) of sodium per serving fit into this eating plan. To find whole grains, look for the word "whole" as the first word in the ingredient list. Shopping Buy products labeled as "low-sodium" or "no salt added." Buy fresh foods. Avoid canned foods and pre-made or frozen meals. Cooking Avoid adding salt when cooking. Use salt-free seasonings or herbs instead of table salt or sea salt. Check with your health care provider or pharmacist before using salt substitutes. Do not fry foods. Cook foods using healthy methods such as baking, boiling, grilling, roasting, and broiling instead. Cook with heart-healthy oils, such as olive, canola, avocado, soybean, or sunflower oil. Meal planning  Eat a balanced diet that includes: 4 or more servings of fruits and 4 or more   servings of vegetables each day. Try to fill one-half of your plate with fruits and vegetables. 6-8 servings of whole grains each day. Less than 6 oz (170 g) of lean meat, poultry, or fish each day. A 3-oz (85-g) serving of meat is about the same size as a deck of cards. One egg equals 1 oz (28 g). 2-3 servings of low-fat dairy each  day. One serving is 1 cup (237 mL). 1 serving of nuts, seeds, or beans 5 times each week. 2-3 servings of heart-healthy fats. Healthy fats called omega-3 fatty acids are found in foods such as walnuts, flaxseeds, fortified milks, and eggs. These fats are also found in cold-water fish, such as sardines, salmon, and mackerel. Limit how much you eat of: Canned or prepackaged foods. Food that is high in trans fat, such as some fried foods. Food that is high in saturated fat, such as fatty meat. Desserts and other sweets, sugary drinks, and other foods with added sugar. Full-fat dairy products. Do not salt foods before eating. Do not eat more than 4 egg yolks a week. Try to eat at least 2 vegetarian meals a week. Eat more home-cooked food and less restaurant, buffet, and fast food. Lifestyle When eating at a restaurant, ask that your food be prepared with less salt or no salt, if possible. If you drink alcohol: Limit how much you use to: 0-1 drink a day for women who are not pregnant. 0-2 drinks a day for men. Be aware of how much alcohol is in your drink. In the [place] one drink equals one 12 oz bottle of beer (355 mL), one 5 oz glass of wine (148 mL), or one 1 oz glass of hard liquor (44 mL). General information Avoid eating more than 2,300 mg of salt a day. If you have hypertension, you may need to reduce your sodium intake to 1,500 mg a day. Work with your health care provider to maintain a healthy body weight or to lose weight. Ask what an ideal weight is for you. Get at least 30 minutes of exercise that causes your heart to beat faster (aerobic exercise) most days of the week. Activities may include walking, swimming, or biking. Work with your health care provider or dietitian to adjust your eating plan to your individual calorie needs. What foods should I eat? Fruits All fresh, dried, or frozen fruit. Canned fruit in natural juice (without added sugar). Vegetables Fresh or frozen  vegetables (raw, steamed, roasted, or grilled). Low-sodium or reduced-sodium tomato and vegetable juice. Low-sodium or reduced-sodium tomato sauce and tomato paste. Low-sodium or reduced-sodium canned vegetables. Grains Whole-grain or whole-wheat bread. Whole-grain or whole-wheat pasta. Brown rice. Oatmeal. Quinoa. Bulgur. Whole-grain and low-sodium cereals. Pita bread. Low-fat, low-sodium crackers. Whole-wheat flour tortillas. Meats and other proteins Skinless chicken or turkey. Ground chicken or turkey. Pork with fat trimmed off. Fish and seafood. Egg whites. Dried beans, peas, or lentils. Unsalted nuts, nut butters, and seeds. Unsalted canned beans. Lean cuts of beef with fat trimmed off. Low-sodium, lean precooked or cured meat, such as sausages or meat loaves. Dairy Low-fat (1%) or fat-free (skim) milk. Reduced-fat, low-fat, or fat-free cheeses. Nonfat, low-sodium ricotta or cottage cheese. Low-fat or nonfat yogurt. Low-fat, low-sodium cheese. Fats and oils Soft margarine without trans fats. Vegetable oil. Reduced-fat, low-fat, or light mayonnaise and salad dressings (reduced-sodium). Canola, safflower, olive, avocado, soybean, and sunflower oils. Avocado. Seasonings and condiments Herbs. Spices. Seasoning mixes without salt. Other foods Unsalted popcorn and pretzels. Fat-free sweets. The items   listed above may not be a complete list of foods and beverages you can eat. Contact a dietitian for more information. What foods should I avoid? Fruits Canned fruit in a light or heavy syrup. Fried fruit. Fruit in cream or butter sauce. Vegetables Creamed or fried vegetables. Vegetables in a cheese sauce. Regular canned vegetables (not low-sodium or reduced-sodium). Regular canned tomato sauce and paste (not low-sodium or reduced-sodium). Regular tomato and vegetable juice (not low-sodium or reduced-sodium). Pickles. Olives. Grains Baked goods made with fat, such as croissants, muffins, or some  breads. Dry pasta or rice meal packs. Meats and other proteins Fatty cuts of meat. Ribs. Fried meat. Bacon. Bologna, salami, and other precooked or cured meats, such as sausages or meat loaves. Fat from the back of a pig (fatback). Bratwurst. Salted nuts and seeds. Canned beans with added salt. Canned or smoked fish. Whole eggs or egg yolks. Chicken or turkey with skin. Dairy Whole or 2% milk, cream, and half-and-half. Whole or full-fat cream cheese. Whole-fat or sweetened yogurt. Full-fat cheese. Nondairy creamers. Whipped toppings. Processed cheese and cheese spreads. Fats and oils Butter. Stick margarine. Lard. Shortening. Ghee. Bacon fat. Tropical oils, such as coconut, palm kernel, or palm oil. Seasonings and condiments Onion salt, garlic salt, seasoned salt, table salt, and sea salt. Worcestershire sauce. Tartar sauce. Barbecue sauce. Teriyaki sauce. Soy sauce, including reduced-sodium. Steak sauce. Canned and packaged gravies. Fish sauce. Oyster sauce. Cocktail sauce. Store-bought horseradish. Ketchup. Mustard. Meat flavorings and tenderizers. Bouillon cubes. Hot sauces. Pre-made or packaged marinades. Pre-made or packaged taco seasonings. Relishes. Regular salad dressings. Other foods Salted popcorn and pretzels. The items listed above may not be a complete list of foods and beverages you should avoid. Contact a dietitian for more information. Where to find more information National Heart, Lung, and Blood Institute: www.nhlbi.nih.gov American Heart Association: www.heart.org Academy of Nutrition and Dietetics: www.eatright.org National Kidney Foundation: www.kidney.org Summary The DASH eating plan is a healthy eating plan that has been shown to reduce high blood pressure (hypertension). It may also reduce your risk for type 2 diabetes, heart disease, and stroke. When on the DASH eating plan, aim to eat more fresh fruits and vegetables, whole grains, lean proteins, low-fat dairy, and  heart-healthy fats. With the DASH eating plan, you should limit salt (sodium) intake to 2,300 mg a day. If you have hypertension, you may need to reduce your sodium intake to 1,500 mg a day. Work with your health care provider or dietitian to adjust your eating plan to your individual calorie needs. This information is not intended to replace advice given to you by your health care provider. Make sure you discuss any questions you have with your health care provider. Document Revised: 01/04/2019 Document Reviewed: 01/04/2019 Elsevier Patient Education  2022 Elsevier Inc.  

## 2021-03-15 ENCOUNTER — Other Ambulatory Visit: Payer: Self-pay

## 2021-03-15 ENCOUNTER — Encounter: Payer: Self-pay | Admitting: Oncology

## 2021-04-08 ENCOUNTER — Ambulatory Visit: Payer: Self-pay | Admitting: Gerontology

## 2021-04-08 ENCOUNTER — Other Ambulatory Visit: Payer: Self-pay

## 2021-04-08 DIAGNOSIS — E785 Hyperlipidemia, unspecified: Secondary | ICD-10-CM

## 2021-04-08 NOTE — Progress Notes (Signed)
Established Patient Office Visit  Subjective:  Patient ID: Jasmine [name], female    DOB: January 14, 1961  Age: 61 y.o. MRN: [id number]  CC: No chief complaint on file.   HPI Jasmine [name]  is a 61 year old female who has history of anemia, arthritis, cerebral venous sinus thrombosis, kidney stones , hypertension, hyperlipidemia presents for routine follow-up visit. She states she's getting over Covid 19 infection. She also reports that she has not stared taking 5 mg Rosuvastatin for LDL of 176 mg/dl, because of the constipation side effects, but has started modifying her diet. Overall, she states that she's doing well and offers no further complaint.   Past Medical History:  Diagnosis Date   Anemia    Arthritis    LEFT SHOULDER   H/O cerebral venous sinus thrombosis 2013   History of hiatal hernia    History of kidney stones     Past Surgical History:  Procedure Laterality Date   COLONOSCOPY WITH PROPOFOL N/A 07/02/2018   Procedure: COLONOSCOPY WITH PROPOFOL;  Surgeon: Virgel Manifold, MD;  Location: [facility] ENDOSCOPY;  Service: Endoscopy;  Laterality: N/A;   ENTEROSCOPY N/A 07/17/2018   Procedure: ENTEROSCOPY;  Surgeon: Jonathon Bellows, MD;  Location: [facility] ENDOSCOPY;  Service: Gastroenterology;  Laterality: N/A;   ESOPHAGOGASTRODUODENOSCOPY N/A 2001   approximately- for Reflux   ESOPHAGOGASTRODUODENOSCOPY (EGD) WITH PROPOFOL N/A 07/02/2018   Procedure: ESOPHAGOGASTRODUODENOSCOPY (EGD) WITH PROPOFOL;  Surgeon: Virgel Manifold, MD;  Location: [facility] ENDOSCOPY;  Service: Endoscopy;  Laterality: N/A;   ESOPHAGOGASTRODUODENOSCOPY (EGD) WITH PROPOFOL N/A 07/11/2018   Procedure: ESOPHAGOGASTRODUODENOSCOPY (EGD) WITH PROPOFOL;  Surgeon: Doran Stabler, MD;  Location: [place];  Service: Gastroenterology;  Laterality: N/A;  PT TO HAVE CAPSULE PLACEMENT   GIVENS CAPSULE STUDY N/A 07/11/2018   Procedure: GIVENS CAPSULE STUDY;  Surgeon: Doran Stabler, MD;  Location: [place];   Service: Gastroenterology;  Laterality: N/A;   HIATAL HERNIA REPAIR  08/23/2018   XI ROBOTIC ASSISTED HIATAL HERNIA REPAIR  WITH FUNDOPLICATION    Family History  Problem Relation Age of Onset   Breast cancer Mother    Bone cancer Mother    Obesity Father    Diabetes Brother    CAD Brother 23       CABG x 4   Cancer Paternal Grandmother    Alcohol abuse Paternal Grandfather     Social History   Socioeconomic History   Marital status: Single    Spouse name: Not on file   Number of children: Not on file   Years of education: Not on file   Highest education level: Not on file  Occupational History   Not on file  Tobacco Use   Smoking status: Never   Smokeless tobacco: Never  Vaping Use   Vaping Use: Never used  Substance and Sexual Activity   Alcohol use: Yes    Comment: occasional   Drug use: Yes    Types: Marijuana    Comment: occasional   Sexual activity: Not Currently  Other Topics Concern   Not on file  Social History Narrative   Lives at home with her parents.  Independent at baseline.   Social Determinants of Health   Financial Resource Strain: Not on file  Food Insecurity: No Food Insecurity   Worried About Charity fundraiser in the Last Year: Never true   Ran Out of Food in the Last Year: Never true  Transportation Needs: No Transportation Needs   Lack of Transportation (Medical):  No   Lack of Transportation (Non-Medical): No  Physical Activity: Not on file  Stress: Not on file  Social Connections: Not on file  Intimate Partner Violence: Not on file    Outpatient Medications Prior to Visit  Medication Sig Dispense Refill   acetaminophen (TYLENOL) 500 MG tablet Take 500 mg by mouth as needed.     amLODipine (NORVASC) 5 MG tablet TAKE ONE TABLET BY MOUTH ONCE EVERY DAY 90 tablet 1   meclizine (ANTIVERT) 25 MG tablet Take 1 tablet (25 mg total) by mouth 3 (three) times daily as needed. (Patient not taking: Reported on 12/21/2020) 30 tablet 0    ondansetron (ZOFRAN-ODT) 4 MG disintegrating tablet Dissolve 1 tablet by mouth once every 8 hours as needed for nausea for up to 7 days (Patient not taking: Reported on 12/21/2020) 14 tablet 0   rivaroxaban (XARELTO) 20 MG TABS tablet TAKE ONE TABLET BY MOUTH EVERY DAY AT 8:00PM 90 tablet 3   rosuvastatin (CRESTOR) 5 MG tablet Take 1 tablet (5 mg total) by mouth once daily. 30 tablet 0   vitamin B-12 (CYANOCOBALAMIN) 500 MCG tablet Take 500 mcg by mouth daily.     Facility-Administered Medications Prior to Visit  Medication Dose Route Frequency Provider Last Rate Last Admin   cyanocobalamin ((VITAMIN B-12)) injection 1,000 mcg  1,000 mcg Intramuscular Q30 days Sindy Guadeloupe, MD   1,000 mcg at 10/09/18 1116    Allergies  Allergen Reactions   Food Other (See Comments)    Pumpkins/squash/zucchini (gourds foods) swelling of the face   Tape Itching, Dermatitis, Rash and Other (See Comments)    Transpore tape   Povidone Iodine    Codeine Other (See Comments)    Crawling skin   Shellfish Allergy Other (See Comments)    Crawling skin/stomach cramps.    ROS Review of Systems  Constitutional: Negative.   Respiratory: Negative.    Cardiovascular: Negative.   Neurological: Negative.   Psychiatric/Behavioral: Negative.       Objective:    Physical Exam Telephone visit There were no vitals taken for this visit. Wt Readings from Last 3 Encounters:  03/03/21 188 lb 11.2 oz (85.6 kg)  12/21/20 186 lb 6.4 oz (84.6 kg)  09/08/20 187 lb 11.2 oz (85.1 kg)     Health Maintenance Due  Topic Date Due   COVID-19 Vaccine (1) Never done   Hepatitis C Screening  Never done   TETANUS/TDAP  Never done   Zoster Vaccines- Shingrix (1 of 2) Never done   PAP SMEAR-Modifier  Never done   MAMMOGRAM  Never done    There are no preventive care reminders to display for this patient.  Lab Results  Component Value Date   TSH 2.300 02/20/2019   Lab Results  Component Value Date   WBC 10.1  12/21/2020   HGB 15.6 (H) 12/21/2020   HCT 47.2 (H) 12/21/2020   MCV 93.1 12/21/2020   PLT 250 12/21/2020   Lab Results  Component Value Date   NA 141 09/02/2020   K 4.0 09/02/2020   CO2 24 09/02/2020   GLUCOSE 84 09/02/2020   BUN 10 09/02/2020   CREATININE 0.58 09/02/2020   BILITOT 0.5 09/02/2020   ALKPHOS 103 09/02/2020   AST 15 09/02/2020   ALT 11 09/02/2020   PROT 7.4 09/02/2020   ALBUMIN 4.6 09/02/2020   CALCIUM 9.5 09/02/2020   ANIONGAP 10 08/24/2018   EGFR 105 09/02/2020   Lab Results  Component Value Date   CHOL  254 (H) 03/03/2021   Lab Results  Component Value Date   HDL 65 03/03/2021   Lab Results  Component Value Date   LDLCALC 176 (H) 03/03/2021   Lab Results  Component Value Date   TRIG 78 03/03/2021   Lab Results  Component Value Date   CHOLHDL 3.9 03/03/2021   Lab Results  Component Value Date   HGBA1C 5.4 09/02/2020      Assessment & Plan:     1. Elevated lipids - She states that she will start taking 5 mg Rosuvastatin tomorrow and will notify clinic of any side effects. She was encouraged to continue on low fat/cholesterol diet and exercise as tolerated.   Follow-up: Return in about 1 month (around 05/06/2021), or if symptoms worsen or fail to improve.    Meliton Samad Jerold Coombe, NP

## 2021-04-08 NOTE — Patient Instructions (Signed)
Heart-Healthy Eating Plan Heart-healthy meal planning includes: Eating less unhealthy fats. Eating more healthy fats. Making other changes in your diet. Talk with your doctor or a diet specialist (dietitian) to create an eating plan that is right for you. What is my plan? Your doctor may recommend an eating plan that includes: Total fat: ______% or less of total calories a day. Saturated fat: ______% or less of total calories a day. Cholesterol: less than _________mg a day. What are tips for following this plan? Cooking Avoid frying your food. Try to bake, boil, grill, or broil it instead. You can also reduce fat by: Removing the skin from poultry. Removing all visible fats from meats. Steaming vegetables in water or broth. Meal planning  At meals, divide your plate into four equal parts: Fill one-half of your plate with vegetables and green salads. Fill one-fourth of your plate with whole grains. Fill one-fourth of your plate with lean protein foods. Eat 4-5 servings of vegetables per day. A serving of vegetables is: 1 cup of raw or cooked vegetables. 2 cups of raw leafy greens. Eat 4-5 servings of fruit per day. A serving of fruit is: 1 medium whole fruit.  cup of dried fruit.  cup of fresh, frozen, or canned fruit.  cup of 100% fruit juice. Eat more foods that have soluble fiber. These are apples, broccoli, carrots, beans, peas, and barley. Try to get 20-30 g of fiber per day. Eat 4-5 servings of nuts, legumes, and seeds per week: 1 serving of dried beans or legumes equals  cup after being cooked. 1 serving of nuts is  cup. 1 serving of seeds equals 1 tablespoon. General information Eat more home-cooked food. Eat less restaurant, buffet, and fast food. Limit or avoid alcohol. Limit foods that are high in starch and sugar. Avoid fried foods. Lose weight if you are overweight. Keep track of how much salt (sodium) you eat. This is important if you have high blood  pressure. Ask your doctor to tell you more about this. Try to add vegetarian meals each week. Fats Choose healthy fats. These include olive oil and canola oil, flaxseeds, walnuts, almonds, and seeds. Eat more omega-3 fats. These include salmon, mackerel, sardines, tuna, flaxseed oil, and ground flaxseeds. Try to eat fish at least 2 times each week. Check food labels. Avoid foods with trans fats or high amounts of saturated fat. Limit saturated fats. These are often found in animal products, such as meats, butter, and cream. These are also found in plant foods, such as palm oil, palm kernel oil, and coconut oil. Avoid foods with partially hydrogenated oils in them. These have trans fats. Examples are stick margarine, some tub margarines, cookies, crackers, and other baked goods. What foods can I eat? Fruits All fresh, canned (in natural juice), or frozen fruits. Vegetables Fresh or frozen vegetables (raw, steamed, roasted, or grilled). Green salads. Grains Most grains. Choose whole wheat and whole grains most of the time. Rice and pasta, including brown rice and pastas made with whole wheat. Meats and other proteins Lean, well-trimmed beef, veal, pork, and lamb. Chicken and turkey without skin. All fish and shellfish. Wild duck, rabbit, pheasant, and venison. Egg whites or low-cholesterol egg substitutes. Dried beans, peas, lentils, and tofu. Seeds and most nuts. Dairy Low-fat or nonfat cheeses, including ricotta and mozzarella. Skim or 1% milk that is liquid, powdered, or evaporated. Buttermilk that is made with low-fat milk. Nonfat or low-fat yogurt. Fats and oils Non-hydrogenated (trans-free) margarines. Vegetable oils, including   soybean, sesame, sunflower, olive, peanut, safflower, corn, canola, and cottonseed. Salad dressings or mayonnaise made with a vegetable oil. Beverages Mineral water. Coffee and tea. Diet carbonated beverages. Sweets and desserts Sherbet, gelatin, and fruit ice.  Small amounts of dark chocolate. Limit all sweets and desserts. Seasonings and condiments All seasonings and condiments. The items listed above may not be a complete list of foods and drinks you can eat. Contact a dietitian for more options. What foods should I avoid? Fruits Canned fruit in heavy syrup. Fruit in cream or butter sauce. Fried fruit. Limit coconut. Vegetables Vegetables cooked in cheese, cream, or butter sauce. Fried vegetables. Grains Breads that are made with saturated or trans fats, oils, or whole milk. Croissants. Sweet rolls. Donuts. High-fat crackers, such as cheese crackers. Meats and other proteins Fatty meats, such as hot dogs, ribs, sausage, bacon, rib-eye roast or steak. High-fat deli meats, such as salami and bologna. Caviar. Domestic duck and goose. Organ meats, such as liver. Dairy Cream, sour cream, cream cheese, and creamed cottage cheese. Whole-milk cheeses. Whole or 2% milk that is liquid, evaporated, or condensed. Whole buttermilk. Cream sauce or high-fat cheese sauce. Yogurt that is made from whole milk. Fats and oils Meat fat, or shortening. Cocoa butter, hydrogenated oils, palm oil, coconut oil, palm kernel oil. Solid fats and shortenings, including bacon fat, salt pork, lard, and butter. Nondairy cream substitutes. Salad dressings with cheese or sour cream. Beverages Regular sodas and juice drinks with added sugar. Sweets and desserts Frosting. Pudding. Cookies. Cakes. Pies. Milk chocolate or white chocolate. Buttered syrups. Full-fat ice cream or ice cream drinks. The items listed above may not be a complete list of foods and drinks to avoid. Contact a dietitian for more information. Summary Heart-healthy meal planning includes eating less unhealthy fats, eating more healthy fats, and making other changes in your diet. Eat a balanced diet. This includes fruits and vegetables, low-fat or nonfat dairy, lean protein, nuts and legumes, whole grains, and  heart-healthy oils and fats. This information is not intended to replace advice given to you by your health care provider. Make sure you discuss any questions you have with your health care provider. Document Revised: 06/11/2020 Document Reviewed: 06/11/2020 Elsevier Patient Education  2022 Elsevier Inc.  

## 2021-04-12 ENCOUNTER — Encounter: Payer: Self-pay | Admitting: Oncology

## 2021-04-12 ENCOUNTER — Other Ambulatory Visit: Payer: Self-pay

## 2021-04-28 ENCOUNTER — Ambulatory Visit: Payer: Self-pay | Admitting: Pharmacy Technician

## 2021-04-28 ENCOUNTER — Other Ambulatory Visit: Payer: Self-pay

## 2021-04-28 DIAGNOSIS — Z79899 Other long term (current) drug therapy: Secondary | ICD-10-CM

## 2021-04-28 NOTE — Progress Notes (Signed)
Assisted patient with re-certification.  Received updated proof of income.  Patient eligible to receive medication assistance at [facility] until time for re-certification in 2024, and as long as eligibility requirements continue to be met. ? ?Jasmine [name] J. Jasmine [name] ?Care Manager ?[facility]  ? ?

## 2021-05-06 ENCOUNTER — Ambulatory Visit: Payer: Self-pay | Admitting: Gerontology

## 2021-05-13 ENCOUNTER — Ambulatory Visit: Payer: Self-pay | Admitting: Gerontology

## 2021-05-13 DIAGNOSIS — E785 Hyperlipidemia, unspecified: Secondary | ICD-10-CM

## 2021-05-13 DIAGNOSIS — Z0000 Encounter for general adult medical examination without abnormal findings: Secondary | ICD-10-CM

## 2021-05-13 NOTE — Progress Notes (Signed)
? ?Established Patient Office Visit ? ?Subjective:  ?Patient ID: Jasmine [name], female    DOB: 02/24/1960  Age: 61 y.o. MRN: [id number] ? ?CC: No chief complaint on file. ? ? ?HPI ?Jasmine [name]  is a 61 year old female who has history of anemia, arthritis, cerebral venous sinus thrombosis, kidney stones , hypertension, hyperlipidemia presents for routine follow up visit. During her last visit, she was started on 5 mg Rosuvastatin for LDL of 176 mg/dl. She states that she self discontinued Rosuvastatin because it exacerbated all her situations such as constipation and arthritic pain, though she reports that her constipation is not bothering her. She states that she has modified her diet and exercises as tolerated. Overall she states that she's doing well and offers no further complaint. ? ?Past Medical History:  ?Diagnosis Date  ? Anemia   ? Arthritis   ? LEFT SHOULDER  ? H/O cerebral venous sinus thrombosis 2013  ? History of hiatal hernia   ? History of kidney stones   ? ? ?Past Surgical History:  ?Procedure Laterality Date  ? COLONOSCOPY WITH PROPOFOL N/A 07/02/2018  ? Procedure: COLONOSCOPY WITH PROPOFOL;  Surgeon: Virgel Manifold, MD;  Location: [facility] ENDOSCOPY;  Service: Endoscopy;  Laterality: N/A;  ? ENTEROSCOPY N/A 07/17/2018  ? Procedure: ENTEROSCOPY;  Surgeon: Jonathon Bellows, MD;  Location: [facility] ENDOSCOPY;  Service: Gastroenterology;  Laterality: N/A;  ? ESOPHAGOGASTRODUODENOSCOPY N/A 2001  ? approximately- for Reflux  ? ESOPHAGOGASTRODUODENOSCOPY (EGD) WITH PROPOFOL N/A 07/02/2018  ? Procedure: ESOPHAGOGASTRODUODENOSCOPY (EGD) WITH PROPOFOL;  Surgeon: Virgel Manifold, MD;  Location: [facility] ENDOSCOPY;  Service: Endoscopy;  Laterality: N/A;  ? ESOPHAGOGASTRODUODENOSCOPY (EGD) WITH PROPOFOL N/A 07/11/2018  ? Procedure: ESOPHAGOGASTRODUODENOSCOPY (EGD) WITH PROPOFOL;  Surgeon: Doran Stabler, MD;  Location: [place];  Service: Gastroenterology;  Laterality: N/A;  PT TO HAVE CAPSULE PLACEMENT  ? GIVENS  CAPSULE STUDY N/A 07/11/2018  ? Procedure: GIVENS CAPSULE STUDY;  Surgeon: Doran Stabler, MD;  Location: [place];  Service: Gastroenterology;  Laterality: N/A;  ? HIATAL HERNIA REPAIR  08/23/2018  ? XI ROBOTIC ASSISTED HIATAL HERNIA REPAIR  WITH FUNDOPLICATION  ? ? ?Family History  ?Problem Relation Age of Onset  ? Breast cancer Mother   ? Bone cancer Mother   ? Obesity Father   ? Diabetes Brother   ? CAD Brother 46  ?     CABG x 4  ? Cancer Paternal Grandmother   ? Alcohol abuse Paternal Grandfather   ? ? ?Social History  ? ?Socioeconomic History  ? Marital status: Single  ?  Spouse name: Not on file  ? Number of children: Not on file  ? Years of education: Not on file  ? Highest education level: Not on file  ?Occupational History  ? Not on file  ?Tobacco Use  ? Smoking status: Never  ? Smokeless tobacco: Never  ?Vaping Use  ? Vaping Use: Never used  ?Substance and Sexual Activity  ? Alcohol use: Yes  ?  Comment: occasional  ? Drug use: Yes  ?  Types: Marijuana  ?  Comment: occasional  ? Sexual activity: Not Currently  ?Other Topics Concern  ? Not on file  ?Social History Narrative  ? Lives at home with her parents.  Independent at baseline.  ? ?Social Determinants of Health  ? ?Financial Resource Strain: Not on file  ?Food Insecurity: No Food Insecurity  ? Worried About Charity fundraiser in the Last Year: Never true  ? Ran Out  of Food in the Last Year: Never true  ?Transportation Needs: No Transportation Needs  ? Lack of Transportation (Medical): No  ? Lack of Transportation (Non-Medical): No  ?Physical Activity: Not on file  ?Stress: Not on file  ?Social Connections: Not on file  ?Intimate Partner Violence: Not on file  ? ? ?Outpatient Medications Prior to Visit  ?Medication Sig Dispense Refill  ? acetaminophen (TYLENOL) 500 MG tablet Take 500 mg by mouth as needed.    ? amLODipine (NORVASC) 5 MG tablet TAKE ONE TABLET BY MOUTH ONCE EVERY DAY 90 tablet 1  ? meclizine (ANTIVERT) 25 MG tablet Take 1  tablet (25 mg total) by mouth 3 (three) times daily as needed. (Patient not taking: Reported on 12/21/2020) 30 tablet 0  ? ondansetron (ZOFRAN-ODT) 4 MG disintegrating tablet Dissolve 1 tablet by mouth once every 8 hours as needed for nausea for up to 7 days (Patient not taking: Reported on 12/21/2020) 14 tablet 0  ? rivaroxaban (XARELTO) 20 MG TABS tablet TAKE ONE TABLET BY MOUTH EVERY DAY AT 8:00PM 90 tablet 3  ? vitamin B-12 (CYANOCOBALAMIN) 500 MCG tablet Take 500 mcg by mouth daily.    ? rosuvastatin (CRESTOR) 5 MG tablet Take 1 tablet (5 mg total) by mouth once daily. 30 tablet 0  ? ?Facility-Administered Medications Prior to Visit  ?Medication Dose Route Frequency Provider Last Rate Last Admin  ? cyanocobalamin ((VITAMIN B-12)) injection 1,000 mcg  1,000 mcg Intramuscular Q30 days Sindy Guadeloupe, MD   1,000 mcg at 10/09/18 1116  ? ? ?Allergies  ?Allergen Reactions  ? Food Other (See Comments)  ?  Pumpkins/squash/zucchini (gourds foods) swelling of the face  ? Tape Itching, Dermatitis, Rash and Other (See Comments)  ?  Transpore tape  ? Povidone Iodine   ? Codeine Other (See Comments)  ?  Crawling skin  ? Shellfish Allergy Other (See Comments)  ?  Crawling skin/stomach cramps.  ? ? ?ROS ?Review of Systems  ?Constitutional: Negative.   ?Respiratory: Negative.    ?Cardiovascular: Negative.   ?Gastrointestinal:  Positive for constipation.  ?Musculoskeletal:  Positive for myalgias.  ? ?  ?Objective:  ?  ?Physical Exam ?Telephone visit. ?There were no vitals taken for this visit. ?Wt Readings from Last 3 Encounters:  ?03/03/21 188 lb 11.2 oz (85.6 kg)  ?12/21/20 186 lb 6.4 oz (84.6 kg)  ?09/08/20 187 lb 11.2 oz (85.1 kg)  ? ? ? ?Health Maintenance Due  ?Topic Date Due  ? COVID-19 Vaccine (1) Never done  ? Hepatitis C Screening  Never done  ? TETANUS/TDAP  Never done  ? Zoster Vaccines- Shingrix (1 of 2) Never done  ? PAP SMEAR-Modifier  Never done  ? MAMMOGRAM  Never done  ? ? ?There are no preventive care reminders  to display for this patient. ? ?Lab Results  ?Component Value Date  ? TSH 2.300 02/20/2019  ? ?Lab Results  ?Component Value Date  ? WBC 10.1 12/21/2020  ? HGB 15.6 (H) 12/21/2020  ? HCT 47.2 (H) 12/21/2020  ? MCV 93.1 12/21/2020  ? PLT 250 12/21/2020  ? ?Lab Results  ?Component Value Date  ? NA 141 09/02/2020  ? K 4.0 09/02/2020  ? CO2 24 09/02/2020  ? GLUCOSE 84 09/02/2020  ? BUN 10 09/02/2020  ? CREATININE 0.58 09/02/2020  ? BILITOT 0.5 09/02/2020  ? ALKPHOS 103 09/02/2020  ? AST 15 09/02/2020  ? ALT 11 09/02/2020  ? PROT 7.4 09/02/2020  ? ALBUMIN 4.6 09/02/2020  ? CALCIUM  9.5 09/02/2020  ? ANIONGAP 10 08/24/2018  ? EGFR 105 09/02/2020  ? ?Lab Results  ?Component Value Date  ? CHOL 254 (H) 03/03/2021  ? ?Lab Results  ?Component Value Date  ? HDL 65 03/03/2021  ? ?Lab Results  ?Component Value Date  ? LDLCALC 176 (H) 03/03/2021  ? ?Lab Results  ?Component Value Date  ? TRIG 78 03/03/2021  ? ?Lab Results  ?Component Value Date  ? CHOLHDL 3.9 03/03/2021  ? ?Lab Results  ?Component Value Date  ? HGBA1C 5.4 09/02/2020  ? ? ?  ?Assessment & Plan:  ? ? ? ?1. Elevated lipids ?- She self discontinued 5 mg Rosuvastatin, and was advised to continue on low fat/cholesterol diet and exercise as tolerated. Will recheck LDL and CK prior to her next visit. ?- Lipid panel; Future ? ?2. Health care maintenance ?- Routine labs will be checked. ?- Comp Met (CMET); Future ?- CBC w/Diff; Future ? ? ?Follow-up: Return in about 6 months (around 11/10/2021), or if symptoms worsen or fail to improve.  ? ? ?Masao Junker Jerold Coombe, NP ?

## 2021-05-13 NOTE — Patient Instructions (Signed)
Constipation, Adult ?Constipation is when a person has fewer than three bowel movements in a week, has difficulty having a bowel movement, or has stools (feces) that are dry, hard, or larger than normal. Constipation may be caused by an underlying condition. It may become worse with age if a person takes certain medicines and does not take in enough fluids. ?Follow these instructions at home: ?Eating and drinking ? ?Eat foods that have a lot of fiber, such as beans, whole grains, and fresh fruits and vegetables. ?Limit foods that are low in fiber and high in fat and processed sugars, such as fried or sweet foods. These include french fries, hamburgers, cookies, candies, and soda. ?Drink enough fluid to keep your urine pale yellow. ?General instructions ?Exercise regularly or as told by your health care provider. Try to do 150 minutes of moderate exercise each week. ?Use the bathroom when you have the urge to go. Do not hold it in. ?Take over-the-counter and prescription medicines only as told by your health care provider. This includes any fiber supplements. ?During bowel movements: ?Practice deep breathing while relaxing the lower abdomen. ?Practice pelvic floor relaxation. ?Watch your condition for any changes. Let your health care provider know about them. ?Keep all follow-up visits as told by your health care provider. This is important. ?Contact a health care provider if: ?You have pain that gets worse. ?You have a fever. ?You do not have a bowel movement after 4 days. ?You vomit. ?You are not hungry or you lose weight. ?You are bleeding from the opening between the buttocks (anus). ?You have thin, pencil-like stools. ?Get help right away if: ?You have a fever and your symptoms suddenly get worse. ?You leak stool or have blood in your stool. ?Your abdomen is bloated. ?You have severe pain in your abdomen. ?You feel dizzy or you faint. ?Summary ?Constipation is when a person has fewer than three bowel movements  in a week, has difficulty having a bowel movement, or has stools (feces) that are dry, hard, or larger than normal. ?Eat foods that have a lot of fiber, such as beans, whole grains, and fresh fruits and vegetables. ?Drink enough fluid to keep your urine pale yellow. ?Take over-the-counter and prescription medicines only as told by your health care provider. This includes any fiber supplements. ?This information is not intended to replace advice given to you by your health care provider. Make sure you discuss any questions you have with your health care provider. ?Document Revised: 12/19/2018 Document Reviewed: 12/19/2018 ?Elsevier Patient Education ? 2022 [employer]. ?Heart-Healthy Eating Plan ?Many factors influence your heart (coronary) health, including eating and exercise habits. Coronary risk increases with abnormal blood fat (lipid) levels. Heart-healthy meal planning includes limiting unhealthy fats, increasing healthy fats, and making other diet and lifestyle changes. ?What is my plan? ?Your health care provider may recommend that you: ?Limit your fat intake to _________% or less of your total calories each day. ?Limit your saturated fat intake to _________% or less of your total calories each day. ?Limit the amount of cholesterol in your diet to less than _________ mg per day. ?What are tips for following this plan? ?Cooking ?Cook foods using methods other than frying. Baking, boiling, grilling, and broiling are all good options. Other ways to reduce fat include: ?Removing the skin from poultry. ?Removing all visible fats from meats. ?Steaming vegetables in water or broth. ?Meal planning ? ?At meals, imagine dividing your plate into fourths: ?Fill one-half of your plate with vegetables and  green salads. ?Fill one-fourth of your plate with whole grains. ?Fill one-fourth of your plate with lean protein foods. ?Eat 4-5 servings of vegetables per day. One serving equals 1 cup raw or cooked vegetable, or 2  cups raw leafy greens. ?Eat 4-5 servings of fruit per day. One serving equals 1 medium whole fruit, ? cup dried fruit, ? cup fresh, frozen, or canned fruit, or ? cup 100% fruit juice. ?Eat more foods that contain soluble fiber. Examples include apples, broccoli, carrots, beans, peas, and barley. Aim to get 25-30 g of fiber per day. ?Increase your consumption of legumes, nuts, and seeds to 4-5 servings per week. One serving of dried beans or legumes equals ? cup cooked, 1 serving of nuts is ? cup, and 1 serving of seeds equals 1 tablespoon. ?Fats ?Choose healthy fats more often. Choose monounsaturated and polyunsaturated fats, such as olive and canola oils, flaxseeds, walnuts, almonds, and seeds. ?Eat more omega-3 fats. Choose salmon, mackerel, sardines, tuna, flaxseed oil, and ground flaxseeds. Aim to eat fish at least 2 times each week. ?Check food labels carefully to identify foods with trans fats or high amounts of saturated fat. ?Limit saturated fats. These are found in animal products, such as meats, butter, and cream. Plant sources of saturated fats include palm oil, palm kernel oil, and coconut oil. ?Avoid foods with partially hydrogenated oils in them. These contain trans fats. Examples are stick margarine, some tub margarines, cookies, crackers, and other baked goods. ?Avoid fried foods. ?General information ?Eat more home-cooked food and less restaurant, buffet, and fast food. ?Limit or avoid alcohol. ?Limit foods that are high in starch and sugar. ?Lose weight if you are overweight. Losing just 5-10% of your body weight can help your overall health and prevent diseases such as diabetes and heart disease. ?Monitor your salt (sodium) intake, especially if you have high blood pressure. Talk with your health care provider about your sodium intake. ?Try to incorporate more vegetarian meals weekly. ?What foods can I eat? ?Fruits ?All fresh, canned (in natural juice), or frozen fruits. ?Vegetables ?Fresh or  frozen vegetables (raw, steamed, roasted, or grilled). Green salads. ?Grains ?Most grains. Choose whole wheat and whole grains most of the time. Rice and pasta, including brown rice and pastas made with whole wheat. ?Meats and other proteins ?Lean, well-trimmed beef, veal, pork, and lamb. Chicken and [place] without skin. All fish and shellfish. Wild duck, rabbit, pheasant, and venison. Egg whites or low-cholesterol egg substitutes. Dried beans, peas, lentils, and tofu. Seeds and most nuts. ?Dairy ?Low-fat or nonfat cheeses, including ricotta and mozzarella. Skim or 1% milk (liquid, powdered, or evaporated). Buttermilk made with low-fat milk. Nonfat or low-fat yogurt. ?Fats and oils ?Non-hydrogenated (trans-free) margarines. Vegetable oils, including soybean, sesame, sunflower, olive, peanut, safflower, corn, canola, and cottonseed. Salad dressings or mayonnaise made with a vegetable oil. ?Beverages ?Water (mineral or sparkling). Coffee and tea. Diet carbonated beverages. ?Sweets and desserts ?Sherbet, gelatin, and fruit ice. Small amounts of dark chocolate. ?Limit all sweets and desserts. ?Seasonings and condiments ?All seasonings and condiments. ?The items listed above may not be a complete list of foods and beverages you can eat. Contact a dietitian for more options. ?What foods are not recommended? ?Fruits ?Canned fruit in heavy syrup. Fruit in cream or butter sauce. Fried fruit. Limit coconut. ?Vegetables ?Vegetables cooked in cheese, cream, or butter sauce. Fried vegetables. ?Grains ?Breads made with saturated or trans fats, oils, or whole milk. Croissants. Sweet rolls. Donuts. High-fat crackers, such as cheese crackers. ?  Meats and other proteins ?Fatty meats, such as hot dogs, ribs, sausage, bacon, rib-eye roast or steak. High-fat deli meats, such as salami and bologna. Caviar. Domestic duck and goose. Organ meats, such as liver. ?Dairy ?Cream, sour cream, cream cheese, and creamed cottage cheese. Whole-milk  cheeses. Whole or 2% milk (liquid, evaporated, or condensed). Whole buttermilk. Cream sauce or high-fat cheese sauce. Whole-milk yogurt. ?Fats and oils ?Meat fat, or shortening. Cocoa butter, hydrogenated oil

## 2021-05-19 ENCOUNTER — Other Ambulatory Visit: Payer: Self-pay

## 2021-06-14 ENCOUNTER — Other Ambulatory Visit: Payer: Self-pay

## 2021-06-14 ENCOUNTER — Encounter: Payer: Self-pay | Admitting: Oncology

## 2021-06-16 ENCOUNTER — Inpatient Hospital Stay: Payer: Self-pay | Attending: Oncology

## 2021-06-16 ENCOUNTER — Other Ambulatory Visit: Payer: Self-pay | Admitting: *Deleted

## 2021-06-16 DIAGNOSIS — Z7901 Long term (current) use of anticoagulants: Secondary | ICD-10-CM | POA: Insufficient documentation

## 2021-06-16 DIAGNOSIS — D509 Iron deficiency anemia, unspecified: Secondary | ICD-10-CM

## 2021-06-16 DIAGNOSIS — Z86718 Personal history of other venous thrombosis and embolism: Secondary | ICD-10-CM | POA: Insufficient documentation

## 2021-06-16 DIAGNOSIS — D751 Secondary polycythemia: Secondary | ICD-10-CM | POA: Insufficient documentation

## 2021-06-16 LAB — CBC
HCT: 45.9 % (ref 36.0–46.0)
Hemoglobin: 15.1 g/dL — ABNORMAL HIGH (ref 12.0–15.0)
MCH: 30.8 pg (ref 26.0–34.0)
MCHC: 32.9 g/dL (ref 30.0–36.0)
MCV: 93.5 fL (ref 80.0–100.0)
Platelets: 238 10*3/uL (ref 150–400)
RBC: 4.91 MIL/uL (ref 3.87–5.11)
RDW: 13.5 % (ref 11.5–15.5)
WBC: 6.6 10*3/uL (ref 4.0–10.5)
nRBC: 0 % (ref 0.0–0.2)

## 2021-06-16 LAB — IRON AND TIBC
Iron: 53 ug/dL (ref 28–170)
Saturation Ratios: 12 % (ref 10.4–31.8)
TIBC: 451 ug/dL — ABNORMAL HIGH (ref 250–450)
UIBC: 398 ug/dL

## 2021-06-16 LAB — FERRITIN: Ferritin: 19 ng/mL (ref 11–307)

## 2021-06-16 NOTE — Progress Notes (Signed)
cbc

## 2021-06-17 ENCOUNTER — Other Ambulatory Visit: Payer: Self-pay

## 2021-06-18 ENCOUNTER — Other Ambulatory Visit: Payer: Self-pay

## 2021-06-21 ENCOUNTER — Encounter: Payer: Self-pay | Admitting: Oncology

## 2021-06-21 ENCOUNTER — Inpatient Hospital Stay (HOSPITAL_BASED_OUTPATIENT_CLINIC_OR_DEPARTMENT_OTHER): Payer: Self-pay | Admitting: Oncology

## 2021-06-21 ENCOUNTER — Other Ambulatory Visit: Payer: Self-pay

## 2021-06-21 VITALS — BP 147/68 | HR 68 | Temp 96.6°F | Resp 18 | Wt 193.5 lb

## 2021-06-21 DIAGNOSIS — D751 Secondary polycythemia: Secondary | ICD-10-CM

## 2021-06-21 DIAGNOSIS — Z7901 Long term (current) use of anticoagulants: Secondary | ICD-10-CM

## 2021-06-24 ENCOUNTER — Encounter: Payer: Self-pay | Admitting: Oncology

## 2021-06-24 NOTE — Progress Notes (Signed)
? ? ? ?Hematology/Oncology Consult note ?[facility]  ?[phone] [phone] ? ?Patient Care Team: ?Langston Reusing, NP as PCP - General (Gerontology)  ? ?Name of the patient: Jasmine [name]  ?[id number]  ?10-04-1960  ? ?Date of visit: 06/24/21 ? ?Diagnosis- history of iron deficiency anemia and cerebral venous sinus thrombosis ?  ? ?Chief complaint/ Reason for visit-routine follow-up of iron deficiency anemia and cerebral venous sinus thrombosis ? ?Heme/Onc history: patient is a 61 year old Caucasian female with a history of Extensive dural venous sinus thrombosis back in 2013.  She was started on Coumadin at that time.  She had hypercoagulable work-up done including prothrombin gene mutation, protein C, protein S activity, factor V activity and Antithrombin III levels which were unremarkable.  She also had work-up for antiphospholipid antibody syndrome which was negative.  No apparent cause for her dural venous sinus thrombosis was found at that time.  She continues to follow-up with neurology and was seen by them in 2016.  At that time her Coumadin was stopped as repeat MRI showed significant improvement in these findings and patient has not had any current strokes.  She was switched to low-dose aspirin.  She then presented with a second episode of dural venous sinus thrombosis in 2018.  At that time she was started on rivaraoxaban and she continues to be on that.  CBC in September 2018 showed a white count of 14.9, hemoglobin interval 9/30.4 with an MCV of 71.2 and a platelet count of 326 which was lower as compared to her baseline hemoglobin of 14 in 2013. ?  ?She now presents to the hospital with right upper quadrant abdominal pain and she was found to have New onset anemia with a hemoglobin of 6.5/25.3 with an MCV of 68.9 and was hence admitted.  B12 levels were low at 100.  Ferritin levels were low at 2 and iron studies showed elevated TIBC of 580 with a low iron  saturation of 7%.  Folate levels were normal. ?  ?egd colonoscopy and capsule study were unremarkable except for large hiatal hernia which was likely the cause of her iron deficiency anemia. She underwent  Nissen fundoplication in July [id number] ?  ? ?Interval history-patient is doing well on Eliquis and reports no bleeding issues.  Denies any specific complaints at this time ? ?ECOG PS- 1 ?Pain scale- 0 ? ? ?Review of systems- Review of Systems  ?Constitutional:  Negative for chills, fever, malaise/fatigue and weight loss.  ?HENT:  Negative for congestion, ear discharge and nosebleeds.   ?Eyes:  Negative for blurred vision.  ?Respiratory:  Negative for cough, hemoptysis, sputum production, shortness of breath and wheezing.   ?Cardiovascular:  Negative for chest pain, palpitations, orthopnea and claudication.  ?Gastrointestinal:  Negative for abdominal pain, blood in stool, constipation, diarrhea, heartburn, melena, nausea and vomiting.  ?Genitourinary:  Negative for dysuria, flank pain, frequency, hematuria and urgency.  ?Musculoskeletal:  Negative for back pain, joint pain and myalgias.  ?Skin:  Negative for rash.  ?Neurological:  Negative for dizziness, tingling, focal weakness, seizures, weakness and headaches.  ?Endo/Heme/Allergies:  Does not bruise/bleed easily.  ?Psychiatric/Behavioral:  Negative for depression and suicidal ideas. The patient does not have insomnia.    ? ? ?Allergies  ?Allergen Reactions  ? Food Other (See Comments)  ?  Pumpkins/squash/zucchini (gourds foods) swelling of the face  ? Tape Itching, Dermatitis, Rash and Other (See Comments)  ?  Transpore tape  ? Povidone Iodine   ? Codeine Other (  See Comments)  ?  Crawling skin  ? Shellfish Allergy Other (See Comments)  ?  Crawling skin/stomach cramps.  ? ? ? ?Past Medical History:  ?Diagnosis Date  ? Anemia   ? Arthritis   ? LEFT SHOULDER  ? H/O cerebral venous sinus thrombosis 2013  ? History of hiatal hernia   ? History of kidney stones    ? ? ? ?Past Surgical History:  ?Procedure Laterality Date  ? COLONOSCOPY WITH PROPOFOL N/A 07/02/2018  ? Procedure: COLONOSCOPY WITH PROPOFOL;  Surgeon: Virgel Manifold, MD;  Location: [facility] ENDOSCOPY;  Service: Endoscopy;  Laterality: N/A;  ? ENTEROSCOPY N/A 07/17/2018  ? Procedure: ENTEROSCOPY;  Surgeon: Jonathon Bellows, MD;  Location: [facility] ENDOSCOPY;  Service: Gastroenterology;  Laterality: N/A;  ? ESOPHAGOGASTRODUODENOSCOPY N/A 2001  ? approximately- for Reflux  ? ESOPHAGOGASTRODUODENOSCOPY (EGD) WITH PROPOFOL N/A 07/02/2018  ? Procedure: ESOPHAGOGASTRODUODENOSCOPY (EGD) WITH PROPOFOL;  Surgeon: Virgel Manifold, MD;  Location: [facility] ENDOSCOPY;  Service: Endoscopy;  Laterality: N/A;  ? ESOPHAGOGASTRODUODENOSCOPY (EGD) WITH PROPOFOL N/A 07/11/2018  ? Procedure: ESOPHAGOGASTRODUODENOSCOPY (EGD) WITH PROPOFOL;  Surgeon: Doran Stabler, MD;  Location: [place];  Service: Gastroenterology;  Laterality: N/A;  PT TO HAVE CAPSULE PLACEMENT  ? GIVENS CAPSULE STUDY N/A 07/11/2018  ? Procedure: GIVENS CAPSULE STUDY;  Surgeon: Doran Stabler, MD;  Location: [place];  Service: Gastroenterology;  Laterality: N/A;  ? HIATAL HERNIA REPAIR  08/23/2018  ? XI ROBOTIC ASSISTED HIATAL HERNIA REPAIR  WITH FUNDOPLICATION  ? ? ?Social History  ? ?Socioeconomic History  ? Marital status: Single  ?  Spouse name: Not on file  ? Number of children: Not on file  ? Years of education: Not on file  ? Highest education level: Not on file  ?Occupational History  ? Not on file  ?Tobacco Use  ? Smoking status: Never  ? Smokeless tobacco: Never  ?Vaping Use  ? Vaping Use: Never used  ?Substance and Sexual Activity  ? Alcohol use: Yes  ?  Comment: occasional  ? Drug use: Yes  ?  Types: Marijuana  ?  Comment: occasional  ? Sexual activity: Not Currently  ?Other Topics Concern  ? Not on file  ?Social History Narrative  ? Lives at home with her parents.  Independent at baseline.  ? ?Social Determinants of Health  ? ?Financial Resource  Strain: Not on file  ?Food Insecurity: No Food Insecurity  ? Worried About Charity fundraiser in the Last Year: Never true  ? Ran Out of Food in the Last Year: Never true  ?Transportation Needs: No Transportation Needs  ? Lack of Transportation (Medical): No  ? Lack of Transportation (Non-Medical): No  ?Physical Activity: Not on file  ?Stress: Not on file  ?Social Connections: Not on file  ?Intimate Partner Violence: Not on file  ? ? ?Family History  ?Problem Relation Age of Onset  ? Breast cancer Mother   ? Bone cancer Mother   ? Obesity Father   ? Diabetes Brother   ? CAD Brother 70  ?     CABG x 4  ? Cancer Paternal Grandmother   ? Alcohol abuse Paternal Grandfather   ? ? ? ?Current Outpatient Medications:  ?  acetaminophen (TYLENOL) 500 MG tablet, Take 500 mg by mouth as needed., Disp: , Rfl:  ?  amLODipine (NORVASC) 5 MG tablet, TAKE ONE TABLET BY MOUTH ONCE EVERY DAY, Disp: 90 tablet, Rfl: 1 ?  rivaroxaban (XARELTO) 20 MG TABS tablet, TAKE ONE  TABLET BY MOUTH EVERY DAY AT 8:00PM, Disp: 90 tablet, Rfl: 3 ?  vitamin B-12 (CYANOCOBALAMIN) 500 MCG tablet, Take 500 mcg by mouth daily., Disp: , Rfl:  ?  meclizine (ANTIVERT) 25 MG tablet, Take 1 tablet (25 mg total) by mouth 3 (three) times daily as needed. (Patient not taking: Reported on 12/21/2020), Disp: 30 tablet, Rfl: 0 ?  ondansetron (ZOFRAN-ODT) 4 MG disintegrating tablet, Dissolve 1 tablet by mouth once every 8 hours as needed for nausea for up to 7 days (Patient not taking: Reported on 12/21/2020), Disp: 14 tablet, Rfl: 0 ?No current facility-administered medications for this visit. ? ?Facility-Administered Medications Ordered in Other Visits:  ?  cyanocobalamin ((VITAMIN B-12)) injection 1,000 mcg, 1,000 mcg, Intramuscular, Q30 days, Sindy Guadeloupe, MD, 1,000 mcg at 10/09/18 1116 ? ?Physical exam:  ?Vitals:  ? 06/21/21 1253  ?BP: (!) 147/68  ?Pulse: 68  ?Resp: 18  ?Temp: (!) 96.6 ?F (35.9 ?C)  ?SpO2: 96%  ?Weight: 193 lb 8 oz (87.8 kg)  ? ?Physical  Exam ?Cardiovascular:  ?   Rate and Rhythm: Normal rate and regular rhythm.  ?   Heart sounds: Normal heart sounds.  ?Pulmonary:  ?   Effort: Pulmonary effort is normal.  ?   Breath sounds: Normal breath sounds.  ?Abdomi

## 2021-07-16 ENCOUNTER — Other Ambulatory Visit: Payer: Self-pay

## 2021-07-19 ENCOUNTER — Encounter: Payer: Self-pay | Admitting: Oncology

## 2021-07-19 ENCOUNTER — Other Ambulatory Visit: Payer: Self-pay

## 2021-07-20 ENCOUNTER — Other Ambulatory Visit: Payer: Self-pay

## 2021-09-08 ENCOUNTER — Other Ambulatory Visit: Payer: Self-pay

## 2021-09-08 ENCOUNTER — Other Ambulatory Visit: Payer: Self-pay | Admitting: Nurse Practitioner

## 2021-09-08 ENCOUNTER — Encounter: Payer: Self-pay | Admitting: Oncology

## 2021-09-08 MED ORDER — RIVAROXABAN 20 MG PO TABS
20.0000 mg | ORAL_TABLET | Freq: Every evening | ORAL | 3 refills | Status: DC
  Filled 2021-09-08: qty 90, 90d supply, fill #0
  Filled 2021-09-13: qty 30, 30d supply, fill #0
  Filled 2021-09-30 – 2021-10-09 (×3): qty 30, 30d supply, fill #1
  Filled 2021-11-15: qty 30, 30d supply, fill #2
  Filled 2021-12-11: qty 30, 30d supply, fill #3
  Filled 2022-01-07: qty 30, 30d supply, fill #4
  Filled 2022-02-08: qty 30, 30d supply, fill #5

## 2021-09-13 ENCOUNTER — Encounter: Payer: Self-pay | Admitting: Oncology

## 2021-09-13 ENCOUNTER — Other Ambulatory Visit: Payer: Self-pay

## 2021-09-13 NOTE — Telephone Encounter (Signed)
Spoke with patient, there is a refill of Xarelto ready for her to pick up at [facility] outpatient pharmacy (confirmed this with pharmacy, copay $0). She will take this while the fills out the required paperwork with Medication Management.  She know that if she is running out of the 30 day supply and has not been approved for assistance, we could provide samples at that time.  Patient stated her understanding.

## 2021-09-14 ENCOUNTER — Other Ambulatory Visit: Payer: Self-pay

## 2021-09-20 ENCOUNTER — Other Ambulatory Visit: Payer: Self-pay

## 2021-09-21 ENCOUNTER — Other Ambulatory Visit: Payer: Self-pay

## 2021-09-22 ENCOUNTER — Other Ambulatory Visit: Payer: Self-pay

## 2021-09-22 DIAGNOSIS — Z0000 Encounter for general adult medical examination without abnormal findings: Secondary | ICD-10-CM

## 2021-09-22 DIAGNOSIS — D751 Secondary polycythemia: Secondary | ICD-10-CM

## 2021-09-22 DIAGNOSIS — E785 Hyperlipidemia, unspecified: Secondary | ICD-10-CM

## 2021-09-22 NOTE — Progress Notes (Signed)
cbc

## 2021-09-23 LAB — COMPREHENSIVE METABOLIC PANEL
ALT: 12 IU/L (ref 0–32)
AST: 15 IU/L (ref 0–40)
Albumin/Globulin Ratio: 1.7 (ref 1.2–2.2)
Albumin: 4.3 g/dL (ref 3.8–4.9)
Alkaline Phosphatase: 86 IU/L (ref 44–121)
BUN/Creatinine Ratio: 16 (ref 9–23)
BUN: 10 mg/dL (ref 6–24)
Bilirubin Total: 0.3 mg/dL (ref 0.0–1.2)
CO2: 22 mmol/L (ref 20–29)
Calcium: 9.5 mg/dL (ref 8.7–10.2)
Chloride: 104 mmol/L (ref 96–106)
Creatinine, Ser: 0.62 mg/dL (ref 0.57–1.00)
Globulin, Total: 2.5 g/dL (ref 1.5–4.5)
Glucose: 107 mg/dL — ABNORMAL HIGH (ref 70–99)
Potassium: 4.4 mmol/L (ref 3.5–5.2)
Sodium: 143 mmol/L (ref 134–144)
Total Protein: 6.8 g/dL (ref 6.0–8.5)
eGFR: 103 mL/min/{1.73_m2} (ref >59)

## 2021-09-23 LAB — CBC WITH DIFFERENTIAL/PLATELET
Basophils Absolute: 0.1 10*3/uL (ref 0.0–0.2)
Basos: 1 %
EOS (ABSOLUTE): 0.3 10*3/uL (ref 0.0–0.4)
Eos: 3 %
Hematocrit: 43.4 % (ref 34.0–46.6)
Hemoglobin: 14.8 g/dL (ref 11.1–15.9)
Immature Grans (Abs): 0 10*3/uL (ref 0.0–0.1)
Immature Granulocytes: 0 %
Lymphocytes Absolute: 1.6 10*3/uL (ref 0.7–3.1)
Lymphs: 22 %
MCH: 30.6 pg (ref 26.6–33.0)
MCHC: 34.1 g/dL (ref 31.5–35.7)
MCV: 90 fL (ref 79–97)
Monocytes Absolute: 0.2 10*3/uL (ref 0.1–0.9)
Monocytes: 3 %
Neutrophils Absolute: 5.3 10*3/uL (ref 1.4–7.0)
Neutrophils: 71 %
Platelets: 250 10*3/uL (ref 150–450)
RBC: 4.83 x10E6/uL (ref 3.77–5.28)
RDW: 12.3 % (ref 11.7–15.4)
WBC: 7.4 10*3/uL (ref 3.4–10.8)

## 2021-09-23 LAB — LIPID PANEL
Chol/HDL Ratio: 4.8 ratio — ABNORMAL HIGH (ref 0.0–4.4)
Cholesterol, Total: 221 mg/dL — ABNORMAL HIGH (ref 100–199)
HDL: 46 mg/dL (ref >39)
LDL Chol Calc (NIH): 153 mg/dL — ABNORMAL HIGH (ref 0–99)
Triglycerides: 121 mg/dL (ref 0–149)
VLDL Cholesterol Cal: 22 mg/dL (ref 5–40)

## 2021-09-23 LAB — IRON,TIBC AND FERRITIN PANEL
Ferritin: 48 ng/mL (ref 15–150)
Iron Saturation: 21 % (ref 15–55)
Iron: 74 ug/dL (ref 27–159)
Total Iron Binding Capacity: 353 ug/dL (ref 250–450)
UIBC: 279 ug/dL (ref 131–425)

## 2021-09-23 LAB — CK: Total CK: 85 U/L (ref 32–182)

## 2021-09-28 ENCOUNTER — Encounter: Payer: Self-pay | Admitting: Gerontology

## 2021-09-28 ENCOUNTER — Encounter: Payer: Self-pay | Admitting: Oncology

## 2021-09-28 ENCOUNTER — Other Ambulatory Visit: Payer: Self-pay

## 2021-09-28 ENCOUNTER — Ambulatory Visit: Payer: Self-pay | Admitting: Gerontology

## 2021-09-28 VITALS — BP 144/79 | HR 59 | Temp 98.1°F | Resp 16 | Ht 63.0 in | Wt 194.0 lb

## 2021-09-28 DIAGNOSIS — G44209 Tension-type headache, unspecified, not intractable: Secondary | ICD-10-CM | POA: Insufficient documentation

## 2021-09-28 DIAGNOSIS — E785 Hyperlipidemia, unspecified: Secondary | ICD-10-CM

## 2021-09-28 DIAGNOSIS — I10 Essential (primary) hypertension: Secondary | ICD-10-CM

## 2021-09-28 MED ORDER — AMLODIPINE BESYLATE 5 MG PO TABS
ORAL_TABLET | Freq: Every day | ORAL | 2 refills | Status: DC
  Filled 2021-09-28: qty 30, 30d supply, fill #0
  Filled 2021-11-15: qty 30, 30d supply, fill #1
  Filled 2021-12-11: qty 30, 30d supply, fill #2
  Filled 2022-01-07: qty 30, 30d supply, fill #3
  Filled 2022-02-08: qty 30, 30d supply, fill #4

## 2021-09-28 MED ORDER — GABAPENTIN 100 MG PO CAPS
200.0000 mg | ORAL_CAPSULE | Freq: Three times a day (TID) | ORAL | 1 refills | Status: DC
  Filled 2021-09-28: qty 180, 30d supply, fill #0
  Filled 2021-10-24: qty 180, 30d supply, fill #1

## 2021-09-28 NOTE — Progress Notes (Signed)
Established Patient Office Visit  Subjective   Patient ID: Jasmine [name], female    DOB: [birth year]  Age: 61 y.o. MRN: [id number]  Chief Complaint  Patient presents with   Follow-up    Patient seen at [facility] 08/30/21   Hypertension    HPI  Jasmine [name]  is a 61 year old female who has history of anemia, arthritis, cerebral venous sinus thrombosis, kidney stones , hypertension, hyperlipidemia presents for routine follow up visit and lab review. She states that she's compliant with her medications and continues to make healthy lifestyle changes. Her lab done on 09/22/21 were unremarkable except LDL decreased from 176 mg/dl, to 153 mg/dl, Total cholesterol from 254 mg/dl to 221 mg/dl, and she self discontinued 5 mg Crestor. She was seen at the [facility]  ED on 08/30/21 for tension headache and was started on 100 mg gabapentin tid. Head CT scan done during visit showed Small focal linear defects in the right distal transverse/sigmoid sinus and adjacent jugular bulb, probably chronic in this patient with history of venous sinus thrombosis. Currently, she states that taking gabapentin moderately relieve symptoms, but sometimes she takes it in 7 hours. She states that headache is at the back of her head. She denies chest pain, palpitation, light headedness, vision changes, hematuria, hematochezia and active bleeding. Overall, she states that she's doing well and offers no further complaint.  Review of Systems  Constitutional: Negative.   Respiratory: Negative.    Cardiovascular: Negative.   Neurological:  Positive for headaches.  Endo/Heme/Allergies: Negative.   Psychiatric/Behavioral: Negative.        Objective:     BP (!) 144/79 (BP Location: Right Arm, Patient Position: Sitting, Cuff Size: Large)   Pulse (!) 59   Temp 98.1 F (36.7 C) (Oral)   Resp 16   Ht '5\' 3"'  (1.6 m)   Wt 194 lb (88 kg)   SpO2 95%   BMI 34.37 kg/m  BP Readings from Last 3 Encounters:  09/28/21 (!) 144/79   09/22/21 (!) 160/95  06/21/21 (!) 147/68   Wt Readings from Last 3 Encounters:  09/28/21 194 lb (88 kg)  09/22/21 195 lb 9.6 oz (88.7 kg)  06/21/21 193 lb 8 oz (87.8 kg)      Physical Exam HENT:     Head: Normocephalic and atraumatic.     Mouth/Throat:     Mouth: Mucous membranes are moist.  Eyes:     Extraocular Movements: Extraocular movements intact.     Conjunctiva/sclera: Conjunctivae normal.     Pupils: Pupils are equal, round, and reactive to light.  Cardiovascular:     Rate and Rhythm: Normal rate and regular rhythm.     Pulses: Normal pulses.     Heart sounds: Normal heart sounds.  Pulmonary:     Effort: Pulmonary effort is normal.     Breath sounds: Normal breath sounds.  Neurological:     General: No focal deficit present.     Mental Status: She is alert and oriented to person, place, and time. Mental status is at baseline.  Psychiatric:        Mood and Affect: Mood normal.        Behavior: Behavior normal.        Thought Content: Thought content normal.        Judgment: Judgment normal.      No results found for any visits on 09/28/21.  Last CBC Lab Results  Component Value Date   WBC 7.4 09/22/2021  HGB 14.8 09/22/2021   HCT 43.4 09/22/2021   MCV 90 09/22/2021   MCH 30.6 09/22/2021   RDW 12.3 09/22/2021   PLT 250 32/35/5732   Last metabolic panel Lab Results  Component Value Date   GLUCOSE 107 (H) 09/22/2021   NA 143 09/22/2021   K 4.4 09/22/2021   CL 104 09/22/2021   CO2 22 09/22/2021   BUN 10 09/22/2021   CREATININE 0.62 09/22/2021   EGFR 103 09/22/2021   CALCIUM 9.5 09/22/2021   PROT 6.8 09/22/2021   ALBUMIN 4.3 09/22/2021   LABGLOB 2.5 09/22/2021   AGRATIO 1.7 09/22/2021   BILITOT 0.3 09/22/2021   ALKPHOS 86 09/22/2021   AST 15 09/22/2021   ALT 12 09/22/2021   ANIONGAP 10 08/24/2018   Last lipids Lab Results  Component Value Date   CHOL 221 (H) 09/22/2021   HDL 46 09/22/2021   LDLCALC 153 (H) 09/22/2021   TRIG 121  09/22/2021   CHOLHDL 4.8 (H) 09/22/2021   Last hemoglobin A1c Lab Results  Component Value Date   HGBA1C 5.4 09/02/2020      The 10-year ASCVD risk score (Arnett DK, et al., 2019) is: 6.2%    Assessment & Plan:   1. Essential hypertension - Her blood pressure is improving, her goal should be less than 140/90, she will continue on current medication, DASH diet and exercise as tolerated. - amLODipine (NORVASC) 5 MG tablet; TAKE ONE TABLET BY MOUTH ONCE EVERY DAY  Dispense: 90 tablet; Refill: 2  2. Elevated lipids - The 10-year ASCVD risk score (Arnett DK, et al., 2019) is: 6.2%   Values used to calculate the score:     Age: 32 years     Sex: Female     Is Non-Hispanic African American: No     Diabetic: No     Tobacco smoker: No     Systolic Blood Pressure: 202 mmHg     Is BP treated: Yes     HDL Cholesterol: 46 mg/dL     Total Cholesterol: 221 mg/dL Her ASCVD was 6.2%, she declined statin therapy, was encouraged to continue on low fat/cholesterol diet and exercise as tolerated.  3. Tension headache - Her gabapentin was increased to 200 mg tid, educated on medication side effects and advised to notify clinic. She will follow up with Neurology on 02/11/22. She was advised to go to the ED with worsening symptoms. - gabapentin (NEURONTIN) 100 MG capsule; Take 2 capsules (200 mg total) by mouth 3 (three) times daily.  Dispense: 180 capsule; Refill: 1   Return in about 4 weeks (around 10/26/2021), or if symptoms worsen or fail to improve.    Shell Yandow Jerold Coombe, NP

## 2021-09-28 NOTE — Patient Instructions (Signed)

## 2021-09-29 ENCOUNTER — Other Ambulatory Visit: Payer: Self-pay | Admitting: Pharmacy Technician

## 2021-09-29 NOTE — Patient Outreach (Signed)
Pt called requesting to change mailing address to [address], [address], [place], [place]  [address].  States that the [address] address belongs to Mother.  Wanted to change address because she had not received pharmacy card from [facility].  Explained that application submitted on 09/21/21 and it will take up to 4 weeks to receive the card.  Patient then decided that she wants her address to remain as is and not to change the address.  Sherilyn Dacosta Patient Advocate Specialist [facility]

## 2021-09-30 ENCOUNTER — Other Ambulatory Visit: Payer: Self-pay

## 2021-09-30 ENCOUNTER — Encounter: Payer: Self-pay | Admitting: Oncology

## 2021-10-08 ENCOUNTER — Other Ambulatory Visit: Payer: Self-pay

## 2021-10-10 ENCOUNTER — Other Ambulatory Visit: Payer: Self-pay

## 2021-10-11 ENCOUNTER — Other Ambulatory Visit: Payer: Self-pay

## 2021-10-24 ENCOUNTER — Encounter: Payer: Self-pay | Admitting: Oncology

## 2021-10-24 ENCOUNTER — Other Ambulatory Visit: Payer: Self-pay

## 2021-10-25 ENCOUNTER — Other Ambulatory Visit: Payer: Self-pay

## 2021-10-26 ENCOUNTER — Encounter: Payer: Self-pay | Admitting: Oncology

## 2021-10-26 ENCOUNTER — Ambulatory Visit: Payer: Self-pay | Admitting: Gerontology

## 2021-10-26 ENCOUNTER — Other Ambulatory Visit: Payer: Self-pay

## 2021-10-26 DIAGNOSIS — Z86718 Personal history of other venous thrombosis and embolism: Secondary | ICD-10-CM

## 2021-10-26 DIAGNOSIS — G44209 Tension-type headache, unspecified, not intractable: Secondary | ICD-10-CM

## 2021-10-26 MED ORDER — GABAPENTIN 100 MG PO CAPS
200.0000 mg | ORAL_CAPSULE | Freq: Three times a day (TID) | ORAL | 3 refills | Status: DC
  Filled 2021-10-26: qty 180, 30d supply, fill #0
  Filled 2021-11-23: qty 180, 30d supply, fill #1
  Filled 2021-12-22: qty 180, 30d supply, fill #2
  Filled 2022-01-20 – 2022-01-22 (×2): qty 180, 30d supply, fill #3

## 2021-10-26 NOTE — Progress Notes (Signed)
Established Patient Office Visit  Subjective   Patient ID: Jasmine [name], female    DOB: 04/15/1960  Age: 61 y.o. MRN: [id number]  No chief complaint on file.   HPI  Jasmine [name]  is a 61 year old female who has history of anemia, arthritis, cerebral venous sinus thrombosis, kidney stones , hypertension, hyperlipidemia presents for routine follow up visit . She was started on Gabapentin 100 mg tid after being diagnosed with Tension headache during her ED visit on 08/30/21. She states that her headache has improved with taking gabapentin 200 mg tid, that she only had one episode of headache since her last visit. She denies chest pain, palpitation, dizziness, hematuria, hematochezia and active bleeding. Overall, she states that she's doing well and offers no further complaint.   Patient Active Problem List   Diagnosis Date Noted   Tension headache 09/28/2021   Health care maintenance 09/08/2020   Essential hypertension 05/28/2019   Elbow pain, chronic, right 05/28/2019   Elevated lipids 02/26/2019   History of cerebral venous sinus thrombosis 12/18/2018   Encounter to establish care 12/18/2018   Chronic pain of both feet 12/18/2018   Elevated blood pressure reading 12/18/2018   S/P Nissen fundoplication (without gastrostomy tube) procedure 08/23/2018   Iron deficiency anemia 07/04/2018   Hiatal hernia    Anemia 06/30/2018   Intracranial and intraspinal phlebitis and thrombophlebitis 10/30/2011   Subarachnoid hemorrhage ([place]) 10/30/2011   Past Medical History:  Diagnosis Date   Anemia    Arthritis    LEFT SHOULDER   H/O cerebral venous sinus thrombosis 2013   History of hiatal hernia    History of kidney stones    Past Surgical History:  Procedure Laterality Date   COLONOSCOPY WITH PROPOFOL N/A 07/02/2018   Procedure: COLONOSCOPY WITH PROPOFOL;  Surgeon: Virgel Manifold, MD;  Location: [facility] ENDOSCOPY;  Service: Endoscopy;  Laterality: N/A;   ENTEROSCOPY N/A 07/17/2018    Procedure: ENTEROSCOPY;  Surgeon: Jonathon Bellows, MD;  Location: [facility] ENDOSCOPY;  Service: Gastroenterology;  Laterality: N/A;   ESOPHAGOGASTRODUODENOSCOPY N/A 2001   approximately- for Reflux   ESOPHAGOGASTRODUODENOSCOPY (EGD) WITH PROPOFOL N/A 07/02/2018   Procedure: ESOPHAGOGASTRODUODENOSCOPY (EGD) WITH PROPOFOL;  Surgeon: Virgel Manifold, MD;  Location: [facility] ENDOSCOPY;  Service: Endoscopy;  Laterality: N/A;   ESOPHAGOGASTRODUODENOSCOPY (EGD) WITH PROPOFOL N/A 07/11/2018   Procedure: ESOPHAGOGASTRODUODENOSCOPY (EGD) WITH PROPOFOL;  Surgeon: Doran Stabler, MD;  Location: [place];  Service: Gastroenterology;  Laterality: N/A;  PT TO HAVE CAPSULE PLACEMENT   GIVENS CAPSULE STUDY N/A 07/11/2018   Procedure: GIVENS CAPSULE STUDY;  Surgeon: Doran Stabler, MD;  Location: [place];  Service: Gastroenterology;  Laterality: N/A;   HIATAL HERNIA REPAIR  08/23/2018   XI ROBOTIC ASSISTED HIATAL HERNIA REPAIR  WITH FUNDOPLICATION   Social History   Tobacco Use   Smoking status: Never   Smokeless tobacco: Never  Vaping Use   Vaping Use: Never used  Substance Use Topics   Alcohol use: Yes    Comment: occasional   Drug use: Yes    Types: Marijuana    Comment: occasional   Allergies  Allergen Reactions   Food Other (See Comments)    Pumpkins/squash/zucchini (gourds foods) swelling of the face   Shellfish Allergy Other (See Comments) and Rash    Crawling skin/stomach cramps. Pumpkins/squash/zucchini (gourds foods) swelling of the face   Tape Itching, Dermatitis, Rash and Other (See Comments)    Transpore tape Transpore tape   Povidone Iodine  Codeine Other (See Comments)    Crawling skin      Review of Systems  Constitutional: Negative.   Respiratory: Negative.    Cardiovascular: Negative.   Gastrointestinal:  Negative for blood in stool.  Genitourinary:  Negative for hematuria.  Neurological: Negative.       Objective:     There were no vitals taken for  this visit. BP Readings from Last 3 Encounters:  09/28/21 (!) 144/79  09/22/21 (!) 160/95  06/21/21 (!) 147/68   Wt Readings from Last 3 Encounters:  09/28/21 194 lb (88 kg)  09/22/21 195 lb 9.6 oz (88.7 kg)  06/21/21 193 lb 8 oz (87.8 kg)      Physical Exam   No results found for any visits on 10/26/21.  Last CBC Lab Results  Component Value Date   WBC 7.4 09/22/2021   HGB 14.8 09/22/2021   HCT 43.4 09/22/2021   MCV 90 09/22/2021   MCH 30.6 09/22/2021   RDW 12.3 09/22/2021   PLT 250 01/60/1093   Last metabolic panel Lab Results  Component Value Date   GLUCOSE 107 (H) 09/22/2021   NA 143 09/22/2021   K 4.4 09/22/2021   CL 104 09/22/2021   CO2 22 09/22/2021   BUN 10 09/22/2021   CREATININE 0.62 09/22/2021   EGFR 103 09/22/2021   CALCIUM 9.5 09/22/2021   PROT 6.8 09/22/2021   ALBUMIN 4.3 09/22/2021   LABGLOB 2.5 09/22/2021   AGRATIO 1.7 09/22/2021   BILITOT 0.3 09/22/2021   ALKPHOS 86 09/22/2021   AST 15 09/22/2021   ALT 12 09/22/2021   ANIONGAP 10 08/24/2018   Last lipids Lab Results  Component Value Date   CHOL 221 (H) 09/22/2021   HDL 46 09/22/2021   LDLCALC 153 (H) 09/22/2021   TRIG 121 09/22/2021   CHOLHDL 4.8 (H) 09/22/2021   Last hemoglobin A1c Lab Results  Component Value Date   HGBA1C 5.4 09/02/2020   Last thyroid functions Lab Results  Component Value Date   TSH 2.300 02/20/2019   T4TOTAL 6.2 02/20/2019      The 10-year ASCVD risk score (Arnett DK, et al., 2019) is: 6.2%    Assessment & Plan:   1. Tension headache - She will continue on current medication, and will follow up with Neurology on 02/11/22. She was advised to go to the ED for worsening symptoms. - gabapentin (NEURONTIN) 100 MG capsule; Take 2 capsules (200 mg total) by mouth 3 (three) times daily.  Dispense: 180 capsule; Refill: 3  2. History of cerebral venous sinus thrombosis - She will follow up at [facility]ematology for Xarelto prescription, because her [facility]  Financial application was approved at 75%. - Ambulatory referral to Hematology / Oncology   Return in about 17 weeks (around 02/22/2022), or if symptoms worsen or fail to improve.    Shelton Soler Jerold Coombe, NP

## 2021-11-03 ENCOUNTER — Other Ambulatory Visit: Payer: Self-pay

## 2021-11-10 ENCOUNTER — Ambulatory Visit: Payer: Self-pay | Admitting: Gerontology

## 2021-11-11 ENCOUNTER — Other Ambulatory Visit: Payer: Self-pay

## 2021-11-12 ENCOUNTER — Other Ambulatory Visit: Payer: Self-pay

## 2021-11-15 ENCOUNTER — Other Ambulatory Visit: Payer: Self-pay

## 2021-11-15 ENCOUNTER — Encounter: Payer: Self-pay | Admitting: Oncology

## 2021-11-23 ENCOUNTER — Other Ambulatory Visit: Payer: Self-pay

## 2021-11-23 ENCOUNTER — Encounter: Payer: Self-pay | Admitting: Oncology

## 2021-12-13 ENCOUNTER — Encounter: Payer: Self-pay | Admitting: Oncology

## 2021-12-13 ENCOUNTER — Other Ambulatory Visit: Payer: Self-pay

## 2021-12-21 ENCOUNTER — Other Ambulatory Visit: Payer: Self-pay | Admitting: *Deleted

## 2021-12-21 DIAGNOSIS — D751 Secondary polycythemia: Secondary | ICD-10-CM

## 2021-12-22 ENCOUNTER — Encounter: Payer: Self-pay | Admitting: Oncology

## 2021-12-22 ENCOUNTER — Inpatient Hospital Stay: Payer: Self-pay | Attending: Oncology

## 2021-12-22 ENCOUNTER — Inpatient Hospital Stay (HOSPITAL_BASED_OUTPATIENT_CLINIC_OR_DEPARTMENT_OTHER): Payer: Self-pay | Admitting: Oncology

## 2021-12-22 VITALS — BP 148/69 | HR 61 | Temp 97.2°F | Resp 18 | Wt 193.7 lb

## 2021-12-22 DIAGNOSIS — Z7901 Long term (current) use of anticoagulants: Secondary | ICD-10-CM | POA: Insufficient documentation

## 2021-12-22 DIAGNOSIS — Z86718 Personal history of other venous thrombosis and embolism: Secondary | ICD-10-CM

## 2021-12-22 DIAGNOSIS — D751 Secondary polycythemia: Secondary | ICD-10-CM

## 2021-12-22 DIAGNOSIS — Z86711 Personal history of pulmonary embolism: Secondary | ICD-10-CM | POA: Insufficient documentation

## 2021-12-22 DIAGNOSIS — D508 Other iron deficiency anemias: Secondary | ICD-10-CM

## 2021-12-22 DIAGNOSIS — D509 Iron deficiency anemia, unspecified: Secondary | ICD-10-CM | POA: Insufficient documentation

## 2021-12-22 LAB — CBC WITH DIFFERENTIAL/PLATELET
Abs Immature Granulocytes: 0.03 10*3/uL (ref 0.00–0.07)
Basophils Absolute: 0.1 10*3/uL (ref 0.0–0.1)
Basophils Relative: 1 %
Eosinophils Absolute: 0.2 10*3/uL (ref 0.0–0.5)
Eosinophils Relative: 2 %
HCT: 44.6 % (ref 36.0–46.0)
Hemoglobin: 14.6 g/dL (ref 12.0–15.0)
Immature Granulocytes: 0 %
Lymphocytes Relative: 18 %
Lymphs Abs: 1.7 10*3/uL (ref 0.7–4.0)
MCH: 30.3 pg (ref 26.0–34.0)
MCHC: 32.7 g/dL (ref 30.0–36.0)
MCV: 92.5 fL (ref 80.0–100.0)
Monocytes Absolute: 0.3 10*3/uL (ref 0.1–1.0)
Monocytes Relative: 3 %
Neutro Abs: 7.1 10*3/uL (ref 1.7–7.7)
Neutrophils Relative %: 76 %
Platelets: 259 10*3/uL (ref 150–400)
RBC: 4.82 MIL/uL (ref 3.87–5.11)
RDW: 12.5 % (ref 11.5–15.5)
WBC: 9.4 10*3/uL (ref 4.0–10.5)
nRBC: 0 % (ref 0.0–0.2)

## 2021-12-22 LAB — IRON AND TIBC
Iron: 72 ug/dL (ref 28–170)
Saturation Ratios: 19 % (ref 10.4–31.8)
TIBC: 384 ug/dL (ref 250–450)
UIBC: 312 ug/dL

## 2021-12-22 LAB — FERRITIN: Ferritin: 26 ng/mL (ref 11–307)

## 2021-12-23 ENCOUNTER — Other Ambulatory Visit: Payer: Self-pay

## 2021-12-23 ENCOUNTER — Encounter: Payer: Self-pay | Admitting: Oncology

## 2021-12-24 ENCOUNTER — Other Ambulatory Visit: Payer: Self-pay

## 2021-12-24 ENCOUNTER — Encounter: Payer: Self-pay | Admitting: Oncology

## 2021-12-24 NOTE — Progress Notes (Signed)
Hematology/Oncology Consult note [facility]  [phone] [phone]  Patient Care Team: Rolm Gala, NP as PCP - General (Gerontology)   Name of the patient: Jasmine [name]  [id number]  30-Aug-1960   Date of visit: 12/24/21  Diagnosis- history of iron deficiency anemia and cerebral venous sinus thrombosis    Chief complaint/ Reason for visit-routine follow-up visit for iron deficiency anemia and cerebral venous sinus thrombosis.  Heme/Onc history: patient is a 61 year old Caucasian female with a history of Extensive dural venous sinus thrombosis back in 2013.  She was started on Coumadin at that time.  She had hypercoagulable work-up done including prothrombin gene mutation, protein C, protein S activity, factor V activity and Antithrombin III levels which were unremarkable.  She also had work-up for antiphospholipid antibody syndrome which was negative.  No apparent cause for her dural venous sinus thrombosis was found at that time.  She continues to follow-up with neurology and was seen by them in 2016.  At that time her Coumadin was stopped as repeat MRI showed significant improvement in these findings and patient has not had any current strokes.  She was switched to low-dose aspirin.  She then presented with a second episode of dural venous sinus thrombosis in 2018.  At that time she was started on rivaraoxaban and she continues to be on that.  CBC in September 2018 showed a white count of 14.9, hemoglobin interval 9/30.4 with an MCV of 71.2 and a platelet count of 326 which was lower as compared to her baseline hemoglobin of 14 in 2013.   She now presents to the hospital with right upper quadrant abdominal pain and she was found to have New onset anemia with a hemoglobin of 6.5/25.3 with an MCV of 68.9 and was hence admitted.  B12 levels were low at 100.  Ferritin levels were low at 2 and iron studies showed elevated TIBC of 580 with a low  iron saturation of 7%.  Folate levels were normal.   egd colonoscopy and capsule study were unremarkable except for large hiatal hernia which was likely the cause of her iron deficiency anemia. She underwent  Nissen fundoplication in July 2020  Interval history-patient had an episode of neck pain/base of head pain for which she was in the [place] had imaging studies done which did not show any evidence of recurrent thrombosis.  She continues to be on Xarelto and is tolerating it well.  ECOG PS- 1 Pain scale- 0   Review of systems- Review of Systems  Constitutional:  Positive for malaise/fatigue. Negative for chills, fever and weight loss.  HENT:  Negative for congestion, ear discharge and nosebleeds.   Eyes:  Negative for blurred vision.  Respiratory:  Negative for cough, hemoptysis, sputum production, shortness of breath and wheezing.   Cardiovascular:  Negative for chest pain, palpitations, orthopnea and claudication.  Gastrointestinal:  Negative for abdominal pain, blood in stool, constipation, diarrhea, heartburn, melena, nausea and vomiting.  Genitourinary:  Negative for dysuria, flank pain, frequency, hematuria and urgency.  Musculoskeletal:  Negative for back pain, joint pain and myalgias.  Skin:  Negative for rash.  Neurological:  Negative for dizziness, tingling, focal weakness, seizures, weakness and headaches.  Endo/Heme/Allergies:  Does not bruise/bleed easily.  Psychiatric/Behavioral:  Negative for depression and suicidal ideas. The patient does not have insomnia.       Allergies  Allergen Reactions   Food Other (See Comments)    Pumpkins/squash/zucchini (gourds foods) swelling of the  face   Shellfish Allergy Other (See Comments) and Rash    Crawling skin/stomach cramps. Pumpkins/squash/zucchini (gourds foods) swelling of the face   Tape Itching, Dermatitis, Rash and Other (See Comments)    Transpore tape Transpore tape   Povidone Iodine    Codeine Other (See Comments)     Crawling skin     Past Medical History:  Diagnosis Date   Anemia    Arthritis    LEFT SHOULDER   H/O cerebral venous sinus thrombosis 2013   History of hiatal hernia    History of kidney stones      Past Surgical History:  Procedure Laterality Date   COLONOSCOPY WITH PROPOFOL N/A 07/02/2018   Procedure: COLONOSCOPY WITH PROPOFOL;  Surgeon: Pasty Spillers, MD;  Location: [facility] ENDOSCOPY;  Service: Endoscopy;  Laterality: N/A;   ENTEROSCOPY N/A 07/17/2018   Procedure: ENTEROSCOPY;  Surgeon: Wyline Mood, MD;  Location: [facility] ENDOSCOPY;  Service: Gastroenterology;  Laterality: N/A;   ESOPHAGOGASTRODUODENOSCOPY N/A 2001   approximately- for Reflux   ESOPHAGOGASTRODUODENOSCOPY (EGD) WITH PROPOFOL N/A 07/02/2018   Procedure: ESOPHAGOGASTRODUODENOSCOPY (EGD) WITH PROPOFOL;  Surgeon: Pasty Spillers, MD;  Location: [facility] ENDOSCOPY;  Service: Endoscopy;  Laterality: N/A;   ESOPHAGOGASTRODUODENOSCOPY (EGD) WITH PROPOFOL N/A 07/11/2018   Procedure: ESOPHAGOGASTRODUODENOSCOPY (EGD) WITH PROPOFOL;  Surgeon: Sherrilyn Rist, MD;  Location: [facility] ENDOSCOPY;  Service: Gastroenterology;  Laterality: N/A;  PT TO HAVE CAPSULE PLACEMENT   GIVENS CAPSULE STUDY N/A 07/11/2018   Procedure: GIVENS CAPSULE STUDY;  Surgeon: Sherrilyn Rist, MD;  Location: [facility] ENDOSCOPY;  Service: Gastroenterology;  Laterality: N/A;   HIATAL HERNIA REPAIR  08/23/2018   XI ROBOTIC ASSISTED HIATAL HERNIA REPAIR  WITH FUNDOPLICATION    Social History   Socioeconomic History   Marital status: Single    Spouse name: Not on file   Number of children: Not on file   Years of education: Not on file   Highest education level: Not on file  Occupational History   Not on file  Tobacco Use   Smoking status: Never   Smokeless tobacco: Never  Vaping Use   Vaping Use: Never used  Substance and Sexual Activity   Alcohol use: Yes    Comment: occasional   Drug use: Yes    Types: Marijuana    Comment: occasional    Sexual activity: Not Currently  Other Topics Concern   Not on file  Social History Narrative   Lives at home with her parents.  Independent at baseline.   Social Determinants of Health   Financial Resource Strain: Not on file  Food Insecurity: No Food Insecurity (09/28/2021)   Hunger Vital Sign    Worried About Running Out of Food in the Last Year: Never true    Ran Out of Food in the Last Year: Never true  Transportation Needs: No Transportation Needs (09/28/2021)   PRAPARE - Administrator, Civil Service (Medical): No    Lack of Transportation (Non-Medical): No  Physical Activity: Not on file  Stress: Not on file  Social Connections: Not on file  Intimate Partner Violence: Not on file    Family History  Problem Relation Age of Onset   Breast cancer Mother    Bone cancer Mother    Obesity Father    Diabetes Brother    CAD Brother 63       CABG x 4   Cancer Paternal Grandmother    Alcohol abuse Paternal Actor  Current Outpatient Medications:    acetaminophen (TYLENOL) 500 MG tablet, Take 500 mg by mouth as needed., Disp: , Rfl:    amLODipine (NORVASC) 5 MG tablet, TAKE ONE TABLET BY MOUTH ONCE EVERY DAY, Disp: 90 tablet, Rfl: 2   gabapentin (NEURONTIN) 100 MG capsule, Take 2 capsules (200 mg total) by mouth 3 (three) times daily., Disp: 180 capsule, Rfl: 3   rivaroxaban (XARELTO) 20 MG TABS tablet, Take 1 tablet (20 mg total) by mouth at bedtime. At 8:00 PM, Disp: 90 tablet, Rfl: 3   vitamin B-12 (CYANOCOBALAMIN) 500 MCG tablet, Take 500 mcg by mouth daily., Disp: , Rfl:    meclizine (ANTIVERT) 25 MG tablet, Take 1 tablet (25 mg total) by mouth 3 (three) times daily as needed. (Patient not taking: Reported on 12/21/2020), Disp: 30 tablet, Rfl: 0 No current facility-administered medications for this visit.  Facility-Administered Medications Ordered in Other Visits:    cyanocobalamin ((VITAMIN B-12)) injection 1,000 mcg, 1,000 mcg, Intramuscular, Q30  days, Creig Hines, MD, 1,000 mcg at 10/09/18 1116  Physical exam:  Vitals:   12/22/21 1402  BP: (!) 148/69  Pulse: 61  Resp: 18  Temp: (!) 97.2 F (36.2 C)  SpO2: 100%  Weight: 193 lb 11.2 oz (87.9 kg)   Physical Exam Constitutional:      General: She is not in acute distress. Cardiovascular:     Rate and Rhythm: Normal rate and regular rhythm.     Heart sounds: Normal heart sounds.  Pulmonary:     Effort: Pulmonary effort is normal.     Breath sounds: Normal breath sounds.  Skin:    General: Skin is warm and dry.  Neurological:     Mental Status: She is alert and oriented to person, place, and time.         Latest Ref Rng & Units 09/22/2021    9:55 AM  CMP  Glucose 70 - 99 mg/dL 124   BUN 6 - 24 mg/dL 10   Creatinine 5.80 - 1.00 mg/dL 9.98   Sodium 338 - 250 mmol/L 143   Potassium 3.5 - 5.2 mmol/L 4.4   Chloride 96 - 106 mmol/L 104   CO2 20 - 29 mmol/L 22   Calcium 8.7 - 10.2 mg/dL 9.5   Total Protein 6.0 - 8.5 g/dL 6.8   Total Bilirubin 0.0 - 1.2 mg/dL 0.3   Alkaline Phos 44 - 121 IU/L 86   AST 0 - 40 IU/L 15   ALT 0 - 32 IU/L 12       Latest Ref Rng & Units 12/22/2021    1:34 PM  CBC  WBC 4.0 - 10.5 K/uL 9.4   Hemoglobin 12.0 - 15.0 g/dL 53.9   Hematocrit 76.7 - 46.0 % 44.6   Platelets 150 - 400 K/uL 259      Assessment and plan- Patient is a 61 y.o. female who is here for follow up of following issues:  History of cerebral venous sinus thrombosis: Continue Xarelto.  No recurrent events.  Iron deficiency anemia:Patient was borderline polycythemic up until May 2023.  Presently hemoglobin normal at 14.8.  Ferritin is mildly low at 26 but I will not plan to give her any IV iron given that her hemoglobin is normal.  We will repeat CBC ferritin and iron studies in 6 months in 1 year and I will see her back in 1 year   Visit Diagnosis 1. Other iron deficiency anemia   2. History of cerebral venous sinus  thrombosis      Dr. Owens Shark, MD, MPH [facility]  at [facility] [phone] 12/24/2021 12:56 PM

## 2021-12-28 LAB — CALRETICULIN (CALR) MUTATION ANALYSIS

## 2021-12-29 LAB — MPL MUTATION ANALYSIS

## 2022-01-07 ENCOUNTER — Encounter: Payer: Self-pay | Admitting: Oncology

## 2022-01-07 ENCOUNTER — Other Ambulatory Visit: Payer: Self-pay

## 2022-01-20 ENCOUNTER — Other Ambulatory Visit: Payer: Self-pay

## 2022-01-23 ENCOUNTER — Encounter: Payer: Self-pay | Admitting: Oncology

## 2022-01-23 ENCOUNTER — Other Ambulatory Visit: Payer: Self-pay

## 2022-01-24 ENCOUNTER — Other Ambulatory Visit: Payer: Self-pay

## 2022-02-08 ENCOUNTER — Other Ambulatory Visit: Payer: Self-pay

## 2022-02-08 ENCOUNTER — Other Ambulatory Visit: Payer: Self-pay | Admitting: Gerontology

## 2022-02-08 ENCOUNTER — Encounter: Payer: Self-pay | Admitting: Oncology

## 2022-02-08 DIAGNOSIS — G44209 Tension-type headache, unspecified, not intractable: Secondary | ICD-10-CM

## 2022-02-08 NOTE — Telephone Encounter (Signed)
Requested refill too early. Last filled 01/26/22

## 2022-02-10 ENCOUNTER — Other Ambulatory Visit: Payer: Self-pay

## 2022-02-22 ENCOUNTER — Other Ambulatory Visit: Payer: Self-pay

## 2022-02-22 ENCOUNTER — Ambulatory Visit: Payer: Self-pay | Admitting: Gerontology

## 2022-02-22 DIAGNOSIS — Z86718 Personal history of other venous thrombosis and embolism: Secondary | ICD-10-CM

## 2022-02-22 DIAGNOSIS — I10 Essential (primary) hypertension: Secondary | ICD-10-CM

## 2022-02-22 DIAGNOSIS — G44209 Tension-type headache, unspecified, not intractable: Secondary | ICD-10-CM

## 2022-02-22 MED ORDER — AMLODIPINE BESYLATE 5 MG PO TABS
ORAL_TABLET | Freq: Every day | ORAL | 2 refills | Status: DC
  Filled 2022-02-22: qty 90, fill #0
  Filled 2022-03-06: qty 90, 90d supply, fill #0
  Filled 2022-06-05: qty 90, 90d supply, fill #1
  Filled 2022-09-04: qty 90, 90d supply, fill #2

## 2022-02-22 MED ORDER — RIVAROXABAN 20 MG PO TABS
20.0000 mg | ORAL_TABLET | Freq: Every evening | ORAL | 3 refills | Status: DC
  Filled 2022-02-22 – 2022-03-06 (×2): qty 90, 90d supply, fill #0
  Filled 2022-05-25 – 2022-06-05 (×3): qty 90, 90d supply, fill #1
  Filled 2022-08-29: qty 90, 90d supply, fill #2
  Filled 2022-11-22: qty 90, 90d supply, fill #3

## 2022-02-22 NOTE — Progress Notes (Signed)
Established Patient Office Visit  Subjective   Patient ID: Jasmine [name], female    DOB: 01-31-1961  Age: 62 y.o. MRN: [id number]  No chief complaint on file.   HPI   Jasmine [name]  is a 62 year old female who has history of anemia, arthritis, cerebral venous sinus thrombosis, kidney stones , hypertension, hyperlipidemia presents for routine follow up visit and medication refill . She states that she's compliant with her medications, denies side effects and continues to make healthy lifestyle changes. She was seen by the Neurologist Dr Neva Seat C.L on 02/11/22 for Tension headache and nerve pain. She was started on Lyrica , states that she's now taking 25 mg daily and her headache is under control. She will follow up with Neurologist on 05/16/22. She also followed up with Hematologist Dr Smith Robert for her iron deficiency anemia and cerebral venous sinus thrombosis. Her hemoglobin was normal and she will follow up with Dr Smith Robert on 12/23/22. Overall, she states that she's doing well and offers no further complaint.     Past Medical History:  Diagnosis Date   Anemia    Arthritis    LEFT SHOULDER   H/O cerebral venous sinus thrombosis 2013   History of hiatal hernia    History of kidney stones    Past Surgical History:  Procedure Laterality Date   COLONOSCOPY WITH PROPOFOL N/A 07/02/2018   Procedure: COLONOSCOPY WITH PROPOFOL;  Surgeon: Pasty Spillers, MD;  Location: [facility] ENDOSCOPY;  Service: Endoscopy;  Laterality: N/A;   ENTEROSCOPY N/A 07/17/2018   Procedure: ENTEROSCOPY;  Surgeon: Wyline Mood, MD;  Location: [facility] ENDOSCOPY;  Service: Gastroenterology;  Laterality: N/A;   ESOPHAGOGASTRODUODENOSCOPY N/A 2001   approximately- for Reflux   ESOPHAGOGASTRODUODENOSCOPY (EGD) WITH PROPOFOL N/A 07/02/2018   Procedure: ESOPHAGOGASTRODUODENOSCOPY (EGD) WITH PROPOFOL;  Surgeon: Pasty Spillers, MD;  Location: [facility] ENDOSCOPY;  Service: Endoscopy;  Laterality: N/A;   ESOPHAGOGASTRODUODENOSCOPY (EGD)  WITH PROPOFOL N/A 07/11/2018   Procedure: ESOPHAGOGASTRODUODENOSCOPY (EGD) WITH PROPOFOL;  Surgeon: Sherrilyn Rist, MD;  Location: [facility] ENDOSCOPY;  Service: Gastroenterology;  Laterality: N/A;  PT TO HAVE CAPSULE PLACEMENT   GIVENS CAPSULE STUDY N/A 07/11/2018   Procedure: GIVENS CAPSULE STUDY;  Surgeon: Sherrilyn Rist, MD;  Location: [facility] ENDOSCOPY;  Service: Gastroenterology;  Laterality: N/A;   HIATAL HERNIA REPAIR  08/23/2018   XI ROBOTIC ASSISTED HIATAL HERNIA REPAIR  WITH FUNDOPLICATION   Social History   Tobacco Use   Smoking status: Never   Smokeless tobacco: Never  Vaping Use   Vaping Use: Never used  Substance Use Topics   Alcohol use: Yes    Comment: occasional   Drug use: Yes    Types: Marijuana    Comment: occasional   Family History  Problem Relation Age of Onset   Breast cancer Mother    Bone cancer Mother    Obesity Father    Diabetes Brother    CAD Brother 41       CABG x 4   Cancer Paternal Grandmother    Alcohol abuse Paternal Grandfather    Allergies  Allergen Reactions   Food Other (See Comments)    Pumpkins/squash/zucchini (gourds foods) swelling of the face   Shellfish Allergy Other (See Comments) and Rash    Crawling skin/stomach cramps. Pumpkins/squash/zucchini (gourds foods) swelling of the face   Tape Itching, Dermatitis, Rash and Other (See Comments)    Transpore tape Transpore tape   Povidone Iodine    Codeine Other (See Comments)  Crawling skin      Review of Systems  Constitutional: Negative.   Respiratory: Negative.    Cardiovascular: Negative.   Neurological: Negative.   Endo/Heme/Allergies: Negative.   Psychiatric/Behavioral: Negative.        Objective:     There were no vitals taken for this visit. BP Readings from Last 3 Encounters:  12/22/21 (!) 148/69  09/28/21 (!) 144/79  09/22/21 (!) 160/95   Wt Readings from Last 3 Encounters:  12/22/21 193 lb 11.2 oz (87.9 kg)  09/28/21 194 lb (88 kg)  09/22/21 195 lb  9.6 oz (88.7 kg)      Physical Exam   No results found for any visits on 02/22/22.  Last CBC Lab Results  Component Value Date   WBC 9.4 12/22/2021   HGB 14.6 12/22/2021   HCT 44.6 12/22/2021   MCV 92.5 12/22/2021   MCH 30.3 12/22/2021   RDW 12.5 12/22/2021   PLT 259 25/42/7062   Last metabolic panel Lab Results  Component Value Date   GLUCOSE 107 (H) 09/22/2021   NA 143 09/22/2021   K 4.4 09/22/2021   CL 104 09/22/2021   CO2 22 09/22/2021   BUN 10 09/22/2021   CREATININE 0.62 09/22/2021   EGFR 103 09/22/2021   CALCIUM 9.5 09/22/2021   PROT 6.8 09/22/2021   ALBUMIN 4.3 09/22/2021   LABGLOB 2.5 09/22/2021   AGRATIO 1.7 09/22/2021   BILITOT 0.3 09/22/2021   ALKPHOS 86 09/22/2021   AST 15 09/22/2021   ALT 12 09/22/2021   ANIONGAP 10 08/24/2018   Last lipids Lab Results  Component Value Date   CHOL 221 (H) 09/22/2021   HDL 46 09/22/2021   LDLCALC 153 (H) 09/22/2021   TRIG 121 09/22/2021   CHOLHDL 4.8 (H) 09/22/2021   Last hemoglobin A1c Lab Results  Component Value Date   HGBA1C 5.4 09/02/2020   Last thyroid functions Lab Results  Component Value Date   TSH 2.300 02/20/2019   T4TOTAL 6.2 02/20/2019      The 10-year ASCVD risk score (Arnett DK, et al., 2019) is: 5.6%    Assessment & Plan:   1. Essential hypertension - She will continue on current medication, DASH diet and exercise as tolerated. - amLODipine (NORVASC) 5 MG tablet; TAKE ONE TABLET BY MOUTH ONCE EVERY DAY  Dispense: 90 tablet; Refill: 2  2. Tension headache - She will continue on Lyrica, and follow up with Neurologist at [facility]. She was advised to go to the ED for worsening symptoms.  3. History of cerebral venous sinus thrombosis - She will continue on current medication, advised to go to the ED for hematuria, hematochezia and active bleeding. - rivaroxaban (XARELTO) 20 MG TABS tablet; Take 1 tablet (20 mg total) by mouth at bedtime. At 8:00 PM  Dispense: 90 tablet; Refill:  3   Return in about 10 months (around 12/29/2022), or if symptoms worsen or fail to improve.    Aigner Horseman Jerold Coombe, NP

## 2022-02-22 NOTE — Patient Instructions (Signed)

## 2022-03-07 ENCOUNTER — Encounter: Payer: Self-pay | Admitting: Oncology

## 2022-03-07 ENCOUNTER — Other Ambulatory Visit: Payer: Self-pay

## 2022-03-11 ENCOUNTER — Other Ambulatory Visit: Payer: Self-pay

## 2022-04-06 ENCOUNTER — Other Ambulatory Visit: Payer: Self-pay

## 2022-05-25 ENCOUNTER — Other Ambulatory Visit: Payer: Self-pay

## 2022-06-05 ENCOUNTER — Encounter: Payer: Self-pay | Admitting: Oncology

## 2022-06-05 ENCOUNTER — Other Ambulatory Visit: Payer: Self-pay

## 2022-06-21 ENCOUNTER — Inpatient Hospital Stay: Payer: Medicaid Other | Attending: Oncology

## 2022-06-21 DIAGNOSIS — D508 Other iron deficiency anemias: Secondary | ICD-10-CM | POA: Insufficient documentation

## 2022-06-21 LAB — IRON AND TIBC
Iron: 66 ug/dL (ref 28–170)
Saturation Ratios: 15 % (ref 10.4–31.8)
TIBC: 431 ug/dL (ref 250–450)
UIBC: 365 ug/dL

## 2022-06-21 LAB — CBC WITH DIFFERENTIAL/PLATELET
Abs Immature Granulocytes: 0.03 10*3/uL (ref 0.00–0.07)
Basophils Absolute: 0.1 10*3/uL (ref 0.0–0.1)
Basophils Relative: 1 %
Eosinophils Absolute: 0.3 10*3/uL (ref 0.0–0.5)
Eosinophils Relative: 2 %
HCT: 44.5 % (ref 36.0–46.0)
Hemoglobin: 14.6 g/dL (ref 12.0–15.0)
Immature Granulocytes: 0 %
Lymphocytes Relative: 20 %
Lymphs Abs: 2.1 10*3/uL (ref 0.7–4.0)
MCH: 29.3 pg (ref 26.0–34.0)
MCHC: 32.8 g/dL (ref 30.0–36.0)
MCV: 89.2 fL (ref 80.0–100.0)
Monocytes Absolute: 0.4 10*3/uL (ref 0.1–1.0)
Monocytes Relative: 4 %
Neutro Abs: 7.6 10*3/uL (ref 1.7–7.7)
Neutrophils Relative %: 73 %
Platelets: 255 10*3/uL (ref 150–400)
RBC: 4.99 MIL/uL (ref 3.87–5.11)
RDW: 13.1 % (ref 11.5–15.5)
WBC: 10.5 10*3/uL (ref 4.0–10.5)
nRBC: 0 % (ref 0.0–0.2)

## 2022-06-21 LAB — FERRITIN: Ferritin: 18 ng/mL (ref 11–307)

## 2022-06-22 ENCOUNTER — Other Ambulatory Visit: Payer: Self-pay

## 2022-06-22 ENCOUNTER — Encounter: Payer: Self-pay | Admitting: *Deleted

## 2022-06-28 ENCOUNTER — Other Ambulatory Visit: Payer: Self-pay | Admitting: Oncology

## 2022-06-28 ENCOUNTER — Inpatient Hospital Stay: Payer: Medicaid Other

## 2022-06-28 VITALS — BP 145/77 | HR 56 | Temp 97.9°F | Resp 18

## 2022-06-28 DIAGNOSIS — D508 Other iron deficiency anemias: Secondary | ICD-10-CM | POA: Diagnosis not present

## 2022-06-28 DIAGNOSIS — D509 Iron deficiency anemia, unspecified: Secondary | ICD-10-CM

## 2022-06-28 MED ORDER — SODIUM CHLORIDE 0.9 % IV SOLN
Freq: Once | INTRAVENOUS | Status: AC
  Filled 2022-06-28: qty 250

## 2022-06-28 MED ORDER — SODIUM CHLORIDE 0.9 % IV SOLN
510.0000 mg | INTRAVENOUS | Status: DC
  Administered 2022-06-28: 510 mg via INTRAVENOUS
  Filled 2022-06-28: qty 17

## 2022-07-05 ENCOUNTER — Inpatient Hospital Stay: Payer: Medicaid Other

## 2022-07-05 VITALS — BP 149/69 | HR 56 | Temp 98.0°F | Resp 18

## 2022-07-05 DIAGNOSIS — D509 Iron deficiency anemia, unspecified: Secondary | ICD-10-CM

## 2022-07-05 DIAGNOSIS — D508 Other iron deficiency anemias: Secondary | ICD-10-CM | POA: Diagnosis not present

## 2022-07-05 MED ORDER — SODIUM CHLORIDE 0.9 % IV SOLN
510.0000 mg | INTRAVENOUS | Status: DC
  Administered 2022-07-05: 510 mg via INTRAVENOUS
  Filled 2022-07-05: qty 510

## 2022-07-05 MED ORDER — SODIUM CHLORIDE 0.9 % IV SOLN
Freq: Once | INTRAVENOUS | Status: AC
  Filled 2022-07-05: qty 250

## 2022-07-16 DIAGNOSIS — Z419 Encounter for procedure for purposes other than remedying health state, unspecified: Secondary | ICD-10-CM | POA: Diagnosis not present

## 2022-07-18 ENCOUNTER — Other Ambulatory Visit: Payer: Self-pay

## 2022-07-26 ENCOUNTER — Encounter: Payer: Self-pay | Admitting: Oncology

## 2022-08-15 DIAGNOSIS — Z419 Encounter for procedure for purposes other than remedying health state, unspecified: Secondary | ICD-10-CM | POA: Diagnosis not present

## 2022-08-29 ENCOUNTER — Encounter: Payer: Self-pay | Admitting: Oncology

## 2022-08-29 ENCOUNTER — Other Ambulatory Visit: Payer: Self-pay

## 2022-08-30 ENCOUNTER — Encounter: Payer: Self-pay | Admitting: Oncology

## 2022-08-30 ENCOUNTER — Other Ambulatory Visit: Payer: Self-pay

## 2022-09-02 ENCOUNTER — Other Ambulatory Visit: Payer: Self-pay

## 2022-09-05 ENCOUNTER — Encounter: Payer: Self-pay | Admitting: Oncology

## 2022-09-05 ENCOUNTER — Other Ambulatory Visit: Payer: Self-pay

## 2022-09-15 DIAGNOSIS — Z419 Encounter for procedure for purposes other than remedying health state, unspecified: Secondary | ICD-10-CM | POA: Diagnosis not present

## 2022-10-03 ENCOUNTER — Other Ambulatory Visit: Payer: Self-pay

## 2022-10-03 DIAGNOSIS — H5213 Myopia, bilateral: Secondary | ICD-10-CM | POA: Diagnosis not present

## 2022-10-16 DIAGNOSIS — Z419 Encounter for procedure for purposes other than remedying health state, unspecified: Secondary | ICD-10-CM | POA: Diagnosis not present

## 2022-10-25 ENCOUNTER — Other Ambulatory Visit: Payer: Self-pay

## 2022-10-25 ENCOUNTER — Emergency Department: Payer: Medicaid Other

## 2022-10-25 ENCOUNTER — Emergency Department
Admission: EM | Admit: 2022-10-25 | Discharge: 2022-10-25 | Disposition: A | Discharge status: Home / Self Care | Payer: Medicaid Other | Attending: Emergency Medicine | Admitting: Emergency Medicine

## 2022-10-25 ENCOUNTER — Encounter: Payer: Self-pay | Admitting: Oncology

## 2022-10-25 DIAGNOSIS — Z7901 Long term (current) use of anticoagulants: Secondary | ICD-10-CM | POA: Diagnosis not present

## 2022-10-25 DIAGNOSIS — K5732 Diverticulitis of large intestine without perforation or abscess without bleeding: Secondary | ICD-10-CM | POA: Insufficient documentation

## 2022-10-25 DIAGNOSIS — R109 Unspecified abdominal pain: Secondary | ICD-10-CM | POA: Diagnosis present

## 2022-10-25 LAB — CBC
HCT: 50.8 % — ABNORMAL HIGH (ref 36.0–46.0)
Hemoglobin: 17.1 g/dL — ABNORMAL HIGH (ref 12.0–15.0)
MCH: 31.8 pg (ref 26.0–34.0)
MCHC: 33.7 g/dL (ref 30.0–36.0)
MCV: 94.6 fL (ref 80.0–100.0)
Platelets: 257 10*3/uL (ref 150–400)
RBC: 5.37 MIL/uL — ABNORMAL HIGH (ref 3.87–5.11)
RDW: 12.8 % (ref 11.5–15.5)
WBC: 7.6 10*3/uL (ref 4.0–10.5)
nRBC: 0 % (ref 0.0–0.2)

## 2022-10-25 LAB — URINALYSIS, ROUTINE W REFLEX MICROSCOPIC
Bilirubin Urine: NEGATIVE
Glucose, UA: NEGATIVE mg/dL
Ketones, ur: 5 mg/dL — AB
Leukocytes,Ua: NEGATIVE
Nitrite: NEGATIVE
Protein, ur: NEGATIVE mg/dL
Specific Gravity, Urine: 1.004 — ABNORMAL LOW (ref 1.005–1.030)
pH: 8 (ref 5.0–8.0)

## 2022-10-25 LAB — COMPREHENSIVE METABOLIC PANEL
ALT: 18 U/L (ref 0–44)
AST: 21 U/L (ref 15–41)
Albumin: 3.8 g/dL (ref 3.5–5.0)
Alkaline Phosphatase: 90 U/L (ref 38–126)
Anion gap: 10 (ref 5–15)
BUN: 8 mg/dL (ref 6–20)
CO2: 25 mmol/L (ref 22–32)
Calcium: 9.2 mg/dL (ref 8.9–10.3)
Chloride: 102 mmol/L (ref 98–111)
Creatinine, Ser: 0.53 mg/dL (ref 0.44–1.00)
GFR, Estimated: >60 mL/min (ref >60)
Glucose, Bld: 110 mg/dL — ABNORMAL HIGH (ref 70–99)
Potassium: 4.1 mmol/L (ref 3.5–5.1)
Sodium: 137 mmol/L (ref 135–145)
Total Bilirubin: 1.2 mg/dL (ref 0.3–1.2)
Total Protein: 7.7 g/dL (ref 6.5–8.1)

## 2022-10-25 LAB — LIPASE, BLOOD: Lipase: 31 U/L (ref 11–51)

## 2022-10-25 MED ORDER — LORAZEPAM 1 MG PO TABS
1.0000 mg | ORAL_TABLET | Freq: Once | ORAL | Status: AC
  Administered 2022-10-25: 1 mg via ORAL
  Filled 2022-10-25: qty 1

## 2022-10-25 MED ORDER — METAMUCIL SMOOTH TEXTURE 58.6 % PO POWD
1.0000 | Freq: Every day | ORAL | 12 refills | Status: AC
  Filled 2022-10-25: qty 283, fill #0

## 2022-10-25 MED ORDER — AMOXICILLIN-POT CLAVULANATE 875-125 MG PO TABS
1.0000 | ORAL_TABLET | Freq: Two times a day (BID) | ORAL | 0 refills | Status: AC
  Filled 2022-10-25: qty 10, 5d supply, fill #0

## 2022-10-25 NOTE — ED Notes (Signed)
EDT unable  to get any blood on pt. Lab called and will come and draw labs.

## 2022-10-25 NOTE — Discharge Instructions (Signed)
You have some mild inflammation of the colon called diverticulitis.  Most of these cases get better without any treatment, however if you experience severe pain, fever, or any worsening of your symptoms please start the 5-day course of Augmentin antibiotic that I have prescribed for you.  Please follow-up with your primary doctor this week in any case.  Thank you for choosing [place] for your health care today!  Please see your primary doctor this week for a follow up appointment.   If you have any new, worsening, or unexpected symptoms call your doctor right away or come back to the emergency department for reevaluation.  It was my pleasure to care for you today.   Daneil Dan Modesto Charon, MD

## 2022-10-25 NOTE — ED Notes (Signed)
Pt going to ct via w/c with ct tech

## 2022-10-25 NOTE — ED Triage Notes (Signed)
Pt presents to ER with c/o abd pain that has been intermittent since last week.  Pt states pain has been in mid abdomen, but has been moving around.  Pt has hx of hiatal hernia, and states she feels like "it is moving up."  Last BM appx one hour ago, and has been having some increased urination.  Pt is otherwise A&O x4 and in NAD at this time.

## 2022-10-25 NOTE — ED Provider Notes (Signed)
[facility] Provider Note    Event Date/Time   First MD Initiated Contact with Patient 10/25/22 [phone]     (approximate)   History   Abdominal Pain   HPI  Jasmine [name] is a 62 y.o. 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  Intermittent cramping pain associated with loose stools no GI bleeding.  Stools are becoming firmer but cramping pain continues.  Periumbilical nonradiating.  Associate with nausea but no vomiting.  Noted abdominal distention as well.  No urinary symptoms.  Independent Historian contributed to assessment above: Her mother is at bedside to corroborate information given above  External Medical Documents Reviewed: Neurosurgery note from 2020 when she underwent a robotic assisted hiatal hernia repair with fundoplication      Physical Exam   Triage Vital Signs: ED Triage Vitals  Encounter Vitals Group     BP 10/25/22 0639 (!) 159/84     Systolic BP Percentile --      Diastolic BP Percentile --      Pulse Rate 10/25/22 0639 79     Resp 10/25/22 0639 18     Temp 10/25/22 0639 98.2 F (36.8 C)     Temp Source 10/25/22 0639 Oral     SpO2 10/25/22 0639 100 %     Weight 10/25/22 0641 200 lb (90.7 kg)     Height 10/25/22 0641 5\' 4"  (1.626 m)     Head Circumference --      Peak Flow --      Pain Score 10/25/22 0646 10     Pain Loc --      Pain Education --      Exclude from Growth Chart --     Most recent vital signs: Vitals:   10/25/22 0639  BP: (!) 159/84  Pulse: 79  Resp: 18  Temp: 98.2 F (36.8 C)  SpO2: 100%    General: Awake, no distress.  CV:  Good peripheral perfusion.  Resp:  Normal effort.  Abd:  No distention.  Other:  Hypertensive but other vital signs within normal limits.  Abdomen appears mildly distended, soft and nontender to palpation  all quadrants.   ED Results / Procedures / Treatments   Labs (all labs ordered are listed, but only abnormal results are displayed) Labs Reviewed  CBC - Abnormal; Notable for the following components:      Result Value   RBC 5.37 (*)    Hemoglobin 17.1 (*)    HCT 50.8 (*)    All other components within normal limits  COMPREHENSIVE METABOLIC PANEL - Abnormal; Notable for the following components:   Glucose, Bld 110 (*)    All other components within normal limits  URINALYSIS, ROUTINE W REFLEX MICROSCOPIC - Abnormal; Notable for the following components:   Color, Urine YELLOW (*)    APPearance CLEAR (*)    Specific Gravity, Urine 1.004 (*)    Hgb urine dipstick MODERATE (*)    Ketones, ur 5 (*)    Bacteria, UA MANY (*)    All other components within normal limits  LIPASE, BLOOD     I ordered and reviewed the above labs they are notable for white blood cell count is normal.  LFTs and lipase unremarkable.   RADIOLOGY I independently reviewed and interpreted CT scan of the abdomen pelvis and see  no obvious obstructive or inflammatory changes. I also reviewed radiologist's formal read.   PROCEDURES:  Critical Care performed: No  Procedures   MEDICATIONS ORDERED IN ED: Medications  LORazepam (ATIVAN) tablet 1 mg (1 mg Oral Given 10/25/22 0814)    IMPRESSION / MDM / ASSESSMENT AND PLAN / ED COURSE  I reviewed the triage vital signs and the nursing notes.                                Patient's presentation is most consistent with acute presentation with potential threat to life or bodily function.  Differential diagnosis includes, but is not limited to, bowel obstruction, diarrheal illness, viral gastroenteritis, appendicitis, cholecystitis, diverticulitis, kidney stone   The patient is on the cardiac monitor to evaluate for evidence of arrhythmia and/or significant heart rate changes.  MDM:    Patient with history abdominal surgery hernia repair with cramping  abdominal pain in the setting of diarrheal illness without GI bleeding.  Looks remarkably well with benign abdominal exam.  Considered intra-abdominal infections or bowel obstruction we will check with CT scan of the abdomen pelvis.   No urinary symptoms doubt UTI.  If imaging above is unremarkable most likely abdominal cramping in the setting of resolving diarrheal illness and will give anticipatory guidance PMD follow-up.  -- Early sigmoid diverticulitis, without fever or leukocytosis, will give a prescription for Augmentin and have the patient's start if her pain continues or develops fever or any worsening.  Will follow-up with PMD.      FINAL CLINICAL IMPRESSION(S) / ED DIAGNOSES   Final diagnoses:  Sigmoid diverticulitis     Rx / DC Orders   ED Discharge Orders          Ordered    amoxicillin-clavulanate (AUGMENTIN) 875-125 MG tablet  2 times daily        10/25/22 0843    psyllium (METAMUCIL SMOOTH TEXTURE) 58.6 % powder  Daily        10/25/22 0843             Note:  This document was prepared using Dragon voice recognition software and may include unintentional dictation errors.    Pilar Jarvis, MD 10/25/22 [phone]

## 2022-10-25 NOTE — ED Notes (Signed)
Lab with pt to get labs that are needed.

## 2022-10-25 NOTE — ED Notes (Signed)
See triage note  Presents with intermittent abd pain  States started last week   Afebrile on arrival

## 2022-11-11 DIAGNOSIS — H524 Presbyopia: Secondary | ICD-10-CM | POA: Diagnosis not present

## 2022-11-19 ENCOUNTER — Encounter: Payer: Self-pay | Admitting: Nurse Practitioner

## 2022-11-22 ENCOUNTER — Telehealth: Payer: Self-pay | Admitting: *Deleted

## 2022-11-22 ENCOUNTER — Other Ambulatory Visit: Payer: Self-pay

## 2022-11-22 ENCOUNTER — Other Ambulatory Visit: Payer: Self-pay | Admitting: Oncology

## 2022-11-22 ENCOUNTER — Encounter: Payer: Self-pay | Admitting: Oncology

## 2022-11-22 DIAGNOSIS — Z86718 Personal history of other venous thrombosis and embolism: Secondary | ICD-10-CM

## 2022-11-22 MED ORDER — RIVAROXABAN 20 MG PO TABS
20.0000 mg | ORAL_TABLET | Freq: Every evening | ORAL | 3 refills | Status: DC
Start: 2022-11-22 — End: 2023-11-06
  Filled 2022-11-22: qty 90, 90d supply, fill #0

## 2022-11-22 NOTE — Telephone Encounter (Signed)
ok 

## 2022-11-22 NOTE — Telephone Encounter (Signed)
The pt has issue for to get the xarelto from the med management and [facility] can't take the RX from them. Pt is a person with Dr. Smith Robert. And she can send it to the [facility] pharmacy. Pt. Is thankful for the help. But next time she wants the med sent to I think [facility] in [place]. She does not have a car and it is easy for her to get there where she lives. Pt will call me on the last week of rx and I will send it to pharmacy in [place]. She has my number

## 2022-11-22 NOTE — Telephone Encounter (Signed)
Patient called and is upset due to the fact that [facility] pharmacy will not fill her Xarelto that was sent by a provider at the [facility] and patient has MEDICAID. She is asking Dr Smith Robert to order it for her. I spoke with Pharmacy who states that they cannot honor the prescription sent because the provider who wrote for it is not a MEDICAID provider., but if Dr Smith Robert writes for it, it would be filled as she is a MEDICAID provider

## 2022-12-16 DIAGNOSIS — Z419 Encounter for procedure for purposes other than remedying health state, unspecified: Secondary | ICD-10-CM | POA: Diagnosis not present

## 2022-12-23 ENCOUNTER — Inpatient Hospital Stay: Payer: Medicaid Other | Admitting: Oncology

## 2022-12-23 ENCOUNTER — Inpatient Hospital Stay: Payer: Medicaid Other

## 2022-12-26 ENCOUNTER — Other Ambulatory Visit: Payer: Self-pay

## 2022-12-26 DIAGNOSIS — D509 Iron deficiency anemia, unspecified: Secondary | ICD-10-CM

## 2022-12-27 ENCOUNTER — Encounter: Payer: Self-pay | Admitting: Oncology

## 2022-12-27 ENCOUNTER — Inpatient Hospital Stay: Payer: Medicaid Other | Attending: Oncology

## 2022-12-27 ENCOUNTER — Inpatient Hospital Stay (HOSPITAL_BASED_OUTPATIENT_CLINIC_OR_DEPARTMENT_OTHER): Payer: Medicaid Other | Admitting: Oncology

## 2022-12-27 VITALS — BP 160/74 | HR 64 | Temp 97.6°F | Resp 18 | Ht 63.0 in | Wt 189.5 lb

## 2022-12-27 DIAGNOSIS — D509 Iron deficiency anemia, unspecified: Secondary | ICD-10-CM

## 2022-12-27 DIAGNOSIS — Z7901 Long term (current) use of anticoagulants: Secondary | ICD-10-CM | POA: Insufficient documentation

## 2022-12-27 DIAGNOSIS — Z86718 Personal history of other venous thrombosis and embolism: Secondary | ICD-10-CM

## 2022-12-27 LAB — CBC (CANCER CENTER ONLY)
HCT: 47.4 % — ABNORMAL HIGH (ref 36.0–46.0)
Hemoglobin: 15.7 g/dL — ABNORMAL HIGH (ref 12.0–15.0)
MCH: 32 pg (ref 26.0–34.0)
MCHC: 33.1 g/dL (ref 30.0–36.0)
MCV: 96.7 fL (ref 80.0–100.0)
Platelet Count: 244 10*3/uL (ref 150–400)
RBC: 4.9 MIL/uL (ref 3.87–5.11)
RDW: 12.3 % (ref 11.5–15.5)
WBC Count: 10.3 10*3/uL (ref 4.0–10.5)
nRBC: 0 % (ref 0.0–0.2)

## 2022-12-27 LAB — IRON AND TIBC
Iron: 106 ug/dL (ref 28–170)
Saturation Ratios: 26 % (ref 10.4–31.8)
TIBC: 406 ug/dL (ref 250–450)
UIBC: 300 ug/dL

## 2022-12-27 LAB — FERRITIN: Ferritin: 97 ng/mL (ref 11–307)

## 2022-12-28 NOTE — Progress Notes (Signed)
Hematology/Oncology Consult note [facility]  [phone] [phone]  Patient Care Team: Practice, [facility] as PCP - General Creig Hines, MD as Consulting Physician (Oncology)   Name of the patient: Jasmine [name]  [id number]  [birth year]   Date of visit: 12/28/22  Diagnosis-history of iron deficiency anemia and cerebral venous sinus thrombosis  Chief complaint/ Reason for visit-routine follow-up of cerebral venous sinus thrombosis  Heme/Onc history: patient is a 62 year old Caucasian female with a history of Extensive dural venous sinus thrombosis back in 2013.  She was started on Coumadin at that time.  She had hypercoagulable work-up done including prothrombin gene mutation, protein C, protein S activity, factor V activity and Antithrombin III levels which were unremarkable.  She also had work-up for antiphospholipid antibody syndrome which was negative.  No apparent cause for her dural venous sinus thrombosis was found at that time.  She continues to follow-up with neurology and was seen by them in 2016.  At that time her Coumadin was stopped as repeat MRI showed significant improvement in these findings and patient has not had any current strokes.  She was switched to low-dose aspirin.  She then presented with a second episode of dural venous sinus thrombosis in 2018.  At that time she was started on rivaraoxaban and she continues to be on that.  CBC in September 2018 showed a white count of 14.9, hemoglobin interval 9/30.4 with an MCV of 71.2 and a platelet count of 326 which was lower as compared to her baseline hemoglobin of 14 in 2013.   She now presents to the hospital with right upper quadrant abdominal pain and she was found to have New onset anemia with a hemoglobin of 6.5/25.3 with an MCV of 68.9 and was hence admitted.  B12 levels were low at 100.  Ferritin levels were low at 2 and iron studies showed elevated TIBC of 580 with a  low iron saturation of 7%.  Folate levels were normal.   egd colonoscopy and capsule study were unremarkable except for large hiatal hernia which was likely the cause of her iron deficiency anemia. She underwent  Nissen fundoplication in July 2020  Interval history-she is tolerating Xarelto well without any significant side effects.  Denies any bleeding issues.    ECOG PS- 1 Pain scale- 0   Review of systems- Review of Systems  Constitutional:  Negative for chills, fever, malaise/fatigue and weight loss.  HENT:  Negative for congestion, ear discharge and nosebleeds.   Eyes:  Negative for blurred vision.  Respiratory:  Negative for cough, hemoptysis, sputum production, shortness of breath and wheezing.   Cardiovascular:  Negative for chest pain, palpitations, orthopnea and claudication.  Gastrointestinal:  Negative for abdominal pain, blood in stool, constipation, diarrhea, heartburn, melena, nausea and vomiting.  Genitourinary:  Negative for dysuria, flank pain, frequency, hematuria and urgency.  Musculoskeletal:  Negative for back pain, joint pain and myalgias.  Skin:  Negative for rash.  Neurological:  Negative for dizziness, tingling, focal weakness, seizures, weakness and headaches.  Endo/Heme/Allergies:  Does not bruise/bleed easily.  Psychiatric/Behavioral:  Negative for depression and suicidal ideas. The patient does not have insomnia.       Allergies  Allergen Reactions   Food Other (See Comments)    Pumpkins/squash/zucchini (gourds foods) swelling of the face   Shellfish Allergy Other (See Comments) and Rash    Crawling skin/stomach cramps. Pumpkins/squash/zucchini (gourds foods) swelling of the face   Tape Itching, Dermatitis, Rash  and Other (See Comments)    Transpore tape Transpore tape   Povidone Iodine    Codeine Other (See Comments)    Crawling skin     Past Medical History:  Diagnosis Date   Anemia    Arthritis    LEFT SHOULDER   H/O cerebral venous  sinus thrombosis 2013   History of hiatal hernia    History of kidney stones      Past Surgical History:  Procedure Laterality Date   COLONOSCOPY WITH PROPOFOL N/A 07/02/2018   Procedure: COLONOSCOPY WITH PROPOFOL;  Surgeon: Pasty Spillers, MD;  Location: [facility] ENDOSCOPY;  Service: Endoscopy;  Laterality: N/A;   ENTEROSCOPY N/A 07/17/2018   Procedure: ENTEROSCOPY;  Surgeon: Wyline Mood, MD;  Location: [facility] ENDOSCOPY;  Service: Gastroenterology;  Laterality: N/A;   ESOPHAGOGASTRODUODENOSCOPY N/A 2001   approximately- for Reflux   ESOPHAGOGASTRODUODENOSCOPY (EGD) WITH PROPOFOL N/A 07/02/2018   Procedure: ESOPHAGOGASTRODUODENOSCOPY (EGD) WITH PROPOFOL;  Surgeon: Pasty Spillers, MD;  Location: [facility] ENDOSCOPY;  Service: Endoscopy;  Laterality: N/A;   ESOPHAGOGASTRODUODENOSCOPY (EGD) WITH PROPOFOL N/A 07/11/2018   Procedure: ESOPHAGOGASTRODUODENOSCOPY (EGD) WITH PROPOFOL;  Surgeon: Sherrilyn Rist, MD;  Location: [facility] ENDOSCOPY;  Service: Gastroenterology;  Laterality: N/A;  PT TO HAVE CAPSULE PLACEMENT   GIVENS CAPSULE STUDY N/A 07/11/2018   Procedure: GIVENS CAPSULE STUDY;  Surgeon: Sherrilyn Rist, MD;  Location: [facility] ENDOSCOPY;  Service: Gastroenterology;  Laterality: N/A;   HIATAL HERNIA REPAIR  08/23/2018   XI ROBOTIC ASSISTED HIATAL HERNIA REPAIR  WITH FUNDOPLICATION    Social History   Socioeconomic History   Marital status: Single    Spouse name: Not on file   Number of children: Not on file   Years of education: Not on file   Highest education level: Not on file  Occupational History   Not on file  Tobacco Use   Smoking status: Never   Smokeless tobacco: Never  Vaping Use   Vaping status: Never Used  Substance and Sexual Activity   Alcohol use: Yes    Comment: occasional   Drug use: Yes    Types: Marijuana    Comment: occasional   Sexual activity: Not Currently  Other Topics Concern   Not on file  Social History Narrative   Lives at home with her parents.   Independent at baseline.   Social Determinants of Health   Financial Resource Strain: Not on file  Food Insecurity: No Food Insecurity (09/28/2021)   Hunger Vital Sign    Worried About Running Out of Food in the Last Year: Never true    Ran Out of Food in the Last Year: Never true  Transportation Needs: No Transportation Needs (09/28/2021)   PRAPARE - Administrator, Civil Service (Medical): No    Lack of Transportation (Non-Medical): No  Physical Activity: Not on file  Stress: Not on file  Social Connections: Not on file  Intimate Partner Violence: Not on file    Family History  Problem Relation Age of Onset   Breast cancer Mother    Bone cancer Mother    Obesity Father    Diabetes Brother    CAD Brother 93       CABG x 4   Cancer Paternal Grandmother    Alcohol abuse Paternal Grandfather      Current Outpatient Medications:    acetaminophen (TYLENOL) 500 MG tablet, Take 500 mg by mouth as needed., Disp: , Rfl:    amLODipine (NORVASC) 5 MG tablet,  TAKE ONE TABLET BY MOUTH ONCE EVERY DAY, Disp: 90 tablet, Rfl: 2   rivaroxaban (XARELTO) 20 MG TABS tablet, Take 1 tablet (20 mg total) by mouth at bedtime at 8:00 PM., Disp: 90 tablet, Rfl: 3   vitamin B-12 (CYANOCOBALAMIN) 500 MCG tablet, Take 500 mcg by mouth daily., Disp: , Rfl:  No current facility-administered medications for this visit.  Facility-Administered Medications Ordered in Other Visits:    cyanocobalamin ((VITAMIN B-12)) injection 1,000 mcg, 1,000 mcg, Intramuscular, Q30 days, Creig Hines, MD, 1,000 mcg at 10/09/18 1116  Physical exam:  Vitals:   12/27/22 1507  BP: (!) 160/74  Pulse: 64  Resp: 18  Temp: 97.6 F (36.4 C)  TempSrc: Tympanic  SpO2: 97%  Weight: 189 lb 8 oz (86 kg)  Height: 5\' 3"  (1.6 m)   Physical Exam Cardiovascular:     Rate and Rhythm: Normal rate and regular rhythm.     Heart sounds: Normal heart sounds.  Pulmonary:     Effort: Pulmonary effort is normal.     Breath  sounds: Normal breath sounds.  Skin:    General: Skin is warm and dry.  Neurological:     Mental Status: She is alert and oriented to person, place, and time.         Latest Ref Rng & Units 10/25/2022    7:17 AM  CMP  Glucose 70 - 99 mg/dL 161   BUN 6 - 20 mg/dL 8   Creatinine 0.96 - 0.45 mg/dL 4.09   Sodium 811 - 914 mmol/L 137   Potassium 3.5 - 5.1 mmol/L 4.1   Chloride 98 - 111 mmol/L 102   CO2 22 - 32 mmol/L 25   Calcium 8.9 - 10.3 mg/dL 9.2   Total Protein 6.5 - 8.1 g/dL 7.7   Total Bilirubin 0.3 - 1.2 mg/dL 1.2   Alkaline Phos 38 - 126 U/L 90   AST 15 - 41 U/L 21   ALT 0 - 44 U/L 18       Latest Ref Rng & Units 12/27/2022    2:40 PM  CBC  WBC 4.0 - 10.5 K/uL 10.3   Hemoglobin 12.0 - 15.0 g/dL 78.2   Hematocrit 95.6 - 46.0 % 47.4   Platelets 150 - 400 K/uL 244      Assessment and plan- Patient is a 62 y.o. female here for follow-up of following issues  History of cerebral venous sinus thrombosis: Continue Xarelto indefinitely.  No bleeding issues.  Continue to monitor  2.  Patient had a prior history of iron deficiency anemia but in the recent past has had polycythemia with a hemoglobin that has fluctuated between 15-17 with no clear rising trend.  JAK2 mutation testing In the past has has been negative.  I will check JAK2 again along with EPO levels in 6 months.  CBC ferritin and iron studies in 6 months in 1 year.  See Dr. Smith Robert in 1 year   Visit Diagnosis 1. Iron deficiency anemia, unspecified iron deficiency anemia type   2. History of cerebral venous sinus thrombosis      Dr. Owens Shark, MD, MPH [facility] [phone] 12/28/2022 9:39 AM

## 2022-12-30 ENCOUNTER — Encounter: Payer: Self-pay | Admitting: Oncology

## 2023-01-03 ENCOUNTER — Ambulatory Visit: Payer: Medicaid Other | Admitting: Gerontology

## 2023-01-15 DIAGNOSIS — Z419 Encounter for procedure for purposes other than remedying health state, unspecified: Secondary | ICD-10-CM | POA: Diagnosis not present

## 2023-02-15 DIAGNOSIS — Z419 Encounter for procedure for purposes other than remedying health state, unspecified: Secondary | ICD-10-CM | POA: Diagnosis not present

## 2023-02-23 ENCOUNTER — Telehealth: Payer: Self-pay | Admitting: *Deleted

## 2023-02-23 ENCOUNTER — Other Ambulatory Visit: Payer: Self-pay

## 2023-02-23 NOTE — Telephone Encounter (Signed)
 I got a call from the patient today stating that she would like to have her medicine for her Xarelto  and [facility] [place] because it is easier for her to get to while she does not have transportation right now.  I called over to [facility] and spoke to them about could be sent [facility] to the [facility] because of the transportation and they told me I just need to call [facility] and asked them if they can do it and if so give him the telephone number so that they can do a phone note over the phone and then it will schedule [facility] the [facility] [place].  As of now it has not been done and so I told the pharmacy here at [facility] that I would check it out in 5:00 and see if it has been done by then and I we will let the patient know when I note.  She is okay with that

## 2023-02-24 ENCOUNTER — Other Ambulatory Visit: Payer: Self-pay

## 2023-03-18 DIAGNOSIS — Z419 Encounter for procedure for purposes other than remedying health state, unspecified: Secondary | ICD-10-CM | POA: Diagnosis not present

## 2023-03-24 ENCOUNTER — Ambulatory Visit: Payer: Medicaid Other | Admitting: Nurse Practitioner

## 2023-03-24 VITALS — BP 164/85 | HR 65 | Ht 63.75 in | Wt 190.0 lb

## 2023-03-24 DIAGNOSIS — Z7689 Persons encountering health services in other specified circumstances: Secondary | ICD-10-CM | POA: Diagnosis not present

## 2023-03-24 DIAGNOSIS — I10 Essential (primary) hypertension: Secondary | ICD-10-CM

## 2023-03-24 DIAGNOSIS — E785 Hyperlipidemia, unspecified: Secondary | ICD-10-CM | POA: Diagnosis not present

## 2023-03-24 DIAGNOSIS — D508 Other iron deficiency anemias: Secondary | ICD-10-CM | POA: Diagnosis not present

## 2023-03-24 MED ORDER — OLMESARTAN MEDOXOMIL 20 MG PO TABS: 20.0000 mg | ORAL_TABLET | Freq: Every day | ORAL | 0 refills | Status: DC

## 2023-03-24 NOTE — Assessment & Plan Note (Signed)
 Reviewed previous labs.  Unable to tolerate statins.  Will check labs at next visit.

## 2023-03-24 NOTE — Assessment & Plan Note (Signed)
 Followed by Hematology.  Labs are stable.  Continue to follow up with specialist.

## 2023-03-24 NOTE — Progress Notes (Signed)
 BP (!) 164/85 (BP Location: Left Arm, Patient Position: Sitting, Cuff Size: Large)   Pulse 65   Ht 5' 3.75 (1.619 m)   Wt 190 lb (86.2 kg)   SpO2 98%   BMI 32.87 kg/m    Subjective:    Patient ID: Jasmine [name], female    DOB: 15-Feb-1960, 63 y.o.   MRN: [id number]  HPI: Jasmine [name] is a 63 y.o. female  Chief Complaint  Patient presents with   Establish Care   Arthritis   Patient presents to clinic to establish care with new PCP.  Introduced to publishing rights manager role and practice setting.  All questions answered.  Discussed provider/patient relationship and expectations.  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  Patient denies a history of:  Diabetes, Thyroid  problems, Anxiety, Neurological problems, and Abdominal problems.   HYPERTENSION / HYPERLIPIDEMIA Amlodipine - caused terrible constipation. Rosuvastatin  caused worsening arthritic pain. Satisfied with current treatment? no Duration of hypertension: years BP monitoring frequency: weekly BP range: 140-150/80-90 BP medication side effects: no Past BP meds: amlodipine  Duration of hyperlipidemia: years Cholesterol medication side effects: no Cholesterol supplements: none Past cholesterol medications: none Medication compliance: excellent compliance Aspirin: no Recent stressors: no Recurrent headaches: yes Visual changes: no Palpitations: no Dyspnea: no Chest pain: no Lower extremity edema: no Dizzy/lightheaded: no   Patient states she has arthritis pain that makes it hard for her to be on her feet.  This makes it difficult for her to work. After four hours she has a difficult time.  Takes her most of the day the next day to help her recover.  She is only working 3 nights a week.  The pain is in her feet, knees and hips.   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   Subarachnoid hemorrhage (HCC) 10/30/2011   History of cerebral venous sinus thrombosis 12/18/2018   Encounter to establish care 12/18/2018   Chronic pain of both feet 12/18/2018   Elevated lipids 02/26/2019   Essential hypertension 05/28/2019   Elbow pain, chronic, right 05/28/2019   Health care maintenance 09/08/2020   Tension headache 09/28/2021   Resolved Ambulatory Problems    Diagnosis Date Noted   Elevated blood pressure reading 12/18/2018   Past Medical History:  Diagnosis Date   Arthritis    H/O cerebral venous sinus thrombosis 2013   History of hiatal hernia    History of kidney stones    Occipital neuralgia of right side    Past Surgical History:  Procedure Laterality Date   COLONOSCOPY WITH PROPOFOL  N/A 07/02/2018   Procedure: COLONOSCOPY WITH PROPOFOL ;  Surgeon: Janalyn Keene NOVAK, MD;  Location: [facility] ENDOSCOPY;  Service: Endoscopy;  Laterality: N/A;   ENTEROSCOPY N/A 07/17/2018   Procedure: ENTEROSCOPY;  Surgeon: Therisa Bi, MD;  Location: [facility] ENDOSCOPY;  Service: Gastroenterology;  Laterality: N/A;   ESOPHAGOGASTRODUODENOSCOPY N/A 2001   approximately- for Reflux   ESOPHAGOGASTRODUODENOSCOPY (EGD) WITH PROPOFOL  N/A 07/02/2018   Procedure: ESOPHAGOGASTRODUODENOSCOPY (EGD) WITH PROPOFOL ;  Surgeon: Janalyn Keene NOVAK, MD;  Location: [facility] ENDOSCOPY;  Service: Endoscopy;  Laterality: N/A;   ESOPHAGOGASTRODUODENOSCOPY (EGD) WITH PROPOFOL  N/A 07/11/2018   Procedure: ESOPHAGOGASTRODUODENOSCOPY (EGD) WITH PROPOFOL ;  Surgeon: Legrand Victory LITTIE DOUGLAS, MD;  Location:  [facility] ENDOSCOPY;  Service: Gastroenterology;  Laterality: N/A;  PT TO HAVE CAPSULE PLACEMENT   GIVENS CAPSULE STUDY N/A 07/11/2018   Procedure: GIVENS CAPSULE STUDY;  Surgeon: Legrand Victory LITTIE DOUGLAS, MD;  Location: [facility]  ENDOSCOPY;  Service: Gastroenterology;  Laterality: N/A;   HIATAL HERNIA REPAIR  08/23/2018   XI ROBOTIC ASSISTED HIATAL HERNIA REPAIR  WITH FUNDOPLICATION   Family History  Problem Relation Age of Onset   Breast cancer Mother    Bone cancer Mother    Obesity Father    Diabetes Brother    CAD Brother 12       CABG x 4   Cancer Paternal Grandmother    Alcohol abuse Paternal Grandfather      Review of Systems  Eyes:  Negative for visual disturbance.  Respiratory:  Negative for cough, chest tightness and shortness of breath.   Cardiovascular:  Negative for chest pain, palpitations and leg swelling.  Musculoskeletal:  Positive for arthralgias.  Neurological:  Negative for dizziness and headaches.    Per HPI unless specifically indicated above     Objective:    BP (!) 164/85 (BP Location: Left Arm, Patient Position: Sitting, Cuff Size: Large)   Pulse 65   Ht 5' 3.75 (1.619 m)   Wt 190 lb (86.2 kg)   SpO2 98%   BMI 32.87 kg/m   Wt Readings from Last 3 Encounters:  03/24/23 190 lb (86.2 kg)  12/27/22 189 lb 8 oz (86 kg)  10/25/22 200 lb (90.7 kg)    Physical Exam Vitals and nursing note reviewed.  Constitutional:      General: She is not in acute distress.    Appearance: Normal appearance. She is normal weight. She is not ill-appearing, toxic-appearing or diaphoretic.  HENT:     Head: Normocephalic.     Right Ear: External ear normal.     Left Ear: External ear normal.     Nose: Nose normal.     Mouth/Throat:     Mouth: Mucous membranes are moist.     Pharynx: Oropharynx is clear.  Eyes:     General:        Right eye: No discharge.        Left eye: No discharge.     Extraocular Movements: Extraocular movements intact.     Conjunctiva/sclera: Conjunctivae normal.     Pupils: Pupils are equal, round, and reactive to light.  Cardiovascular:     Rate and Rhythm: Normal rate and regular rhythm.     Heart sounds: No murmur heard. Pulmonary:     Effort: Pulmonary  effort is normal. No respiratory distress.     Breath sounds: Normal breath sounds. No wheezing or rales.  Musculoskeletal:     Cervical back: Normal range of motion and neck supple.  Skin:    General: Skin is warm and dry.     Capillary Refill: Capillary refill takes less than 2 seconds.  Neurological:     General: No focal deficit present.     Mental Status: She is alert and oriented to person, place, and time. Mental status is at baseline.  Psychiatric:        Mood and Affect: Mood normal.        Behavior: Behavior normal.        Thought Content: Thought content normal.        Judgment: Judgment normal.     Results for orders placed or performed in visit on 12/27/22  Iron  and TIBC([facility])  Collection Time: 12/27/22  2:40 PM  Result Value Ref Range   Iron  106 28 - 170 ug/dL   TIBC 593 749 - 549 ug/dL   Saturation Ratios 26 10.4 - 31.8 %   UIBC 300 ug/dL  Ferritin   Collection Time: 12/27/22  2:40 PM  Result Value Ref Range   Ferritin 97 11 - 307 ng/mL  CBC (Cancer Center Only)   Collection Time: 12/27/22  2:40 PM  Result Value Ref Range   WBC Count 10.3 4.0 - 10.5 K/uL   RBC 4.90 3.87 - 5.11 MIL/uL   Hemoglobin 15.7 (H) 12.0 - 15.0 g/dL   HCT 52.5 (H) 63.9 - 53.9 %   MCV 96.7 80.0 - 100.0 fL   MCH 32.0 26.0 - 34.0 pg   MCHC 33.1 30.0 - 36.0 g/dL   RDW 87.6 88.4 - 84.4 %   Platelet Count 244 150 - 400 K/uL   nRBC 0.0 0.0 - 0.2 %      Assessment & Plan:   Problem List Items Addressed This Visit       Cardiovascular and Mediastinum   Essential hypertension   Chronic. Not well controlled.  Has constipation with Amlodipine . Will change to Olmesartan .  Side effects and benefits of medication discussed during visit.  Follow up in 1 month.  Will check labs at next visit.       Relevant Medications   olmesartan  (BENICAR ) 20 MG tablet     Other   Anemia - Primary   Followed by Hematology.  Labs are stable.  Continue to follow up with specialist.       Encounter to establish care   Elevated lipids   Reviewed previous labs.  Unable to tolerate statins.  Will check labs at next visit.         Follow up plan: Return in about 1 month (around 04/21/2023) for Physical and Fasting labs.

## 2023-03-24 NOTE — Assessment & Plan Note (Signed)
 Chronic. Not well controlled.  Has constipation with Amlodipine . Will change to Olmesartan .  Side effects and benefits of medication discussed during visit.  Follow up in 1 month.  Will check labs at next visit.

## 2023-04-15 DIAGNOSIS — Z419 Encounter for procedure for purposes other than remedying health state, unspecified: Secondary | ICD-10-CM | POA: Diagnosis not present

## 2023-04-16 ENCOUNTER — Other Ambulatory Visit: Payer: Self-pay | Admitting: Nurse Practitioner

## 2023-04-18 NOTE — Telephone Encounter (Signed)
 Requested medication (s) are due for refill today: Yes  Requested medication (s) are on the active medication list: Yes  Last refill:  03/24/23  Future visit scheduled: Yes  Notes to clinic:  Se pharmacy request.    Requested Prescriptions  Pending Prescriptions Disp Refills   olmesartan (BENICAR) 20 MG tablet [Pharmacy Med Name: OLMESARTAN MEDOXOMIL 20 MG TAB] 90 tablet 1    Sig: TAKE 1 TABLET BY MOUTH EVERY DAY     Cardiovascular:  Angiotensin Receptor Blockers Failed - 04/18/2023  8:17 AM      Failed - Last BP in normal range    BP Readings from Last 1 Encounters:  03/24/23 (!) 164/85         Failed - Valid encounter within last 6 months    Recent Outpatient Visits   None     Future Appointments             In 2 weeks Jasmine Skiff, NP [place] [facility]            Passed - Cr in normal range and within 180 days    Creatinine, Ser  Date Value Ref Range Status  10/25/2022 0.53 0.44 - 1.00 mg/dL Final         Passed - K in normal range and within 180 days    Potassium  Date Value Ref Range Status  10/25/2022 4.1 3.5 - 5.1 mmol/L Final         Passed - Patient is not pregnant

## 2023-04-24 ENCOUNTER — Encounter: Payer: Medicaid Other | Admitting: Nurse Practitioner

## 2023-05-01 ENCOUNTER — Encounter: Payer: Medicaid Other | Admitting: Nurse Practitioner

## 2023-05-01 ENCOUNTER — Telehealth: Payer: Self-pay | Admitting: Nurse Practitioner

## 2023-05-01 NOTE — Telephone Encounter (Signed)
  Copied from CRM [id number]. Topic: Appointments - Scheduling Inquiry for Clinic >> May 01, 2023  8:53 AM Jasmine [name] wrote: Reason for CRM: Patient is calling to request if she can switch Providers to [facility]. Patient has no complaints working with Clydie Braun. Patient received new medicaid card with Jasmine [name]'s name on it. Patient has heard great recommendations about Jasmine [name]. And is excited to partner with her. Please calling patient with appointment request when this is approved.

## 2023-05-02 ENCOUNTER — Encounter: Payer: Medicaid Other | Admitting: Nurse Practitioner

## 2023-05-04 ENCOUNTER — Encounter: Admitting: Nurse Practitioner

## 2023-05-16 NOTE — Telephone Encounter (Signed)
error 

## 2023-05-17 ENCOUNTER — Other Ambulatory Visit: Payer: Self-pay

## 2023-05-17 ENCOUNTER — Emergency Department

## 2023-05-17 ENCOUNTER — Emergency Department: Admission: EM | Admit: 2023-05-17 | Discharge: 2023-05-17 | Disposition: A | Discharge status: Home / Self Care

## 2023-05-17 DIAGNOSIS — R109 Unspecified abdominal pain: Secondary | ICD-10-CM | POA: Insufficient documentation

## 2023-05-17 DIAGNOSIS — R42 Dizziness and giddiness: Secondary | ICD-10-CM | POA: Insufficient documentation

## 2023-05-17 DIAGNOSIS — I10 Essential (primary) hypertension: Secondary | ICD-10-CM | POA: Diagnosis not present

## 2023-05-17 LAB — URINALYSIS, ROUTINE W REFLEX MICROSCOPIC
Bilirubin Urine: NEGATIVE
Glucose, UA: NEGATIVE mg/dL
Ketones, ur: 5 mg/dL — AB
Leukocytes,Ua: NEGATIVE
Nitrite: NEGATIVE
Protein, ur: NEGATIVE mg/dL
Specific Gravity, Urine: 1.006 (ref 1.005–1.030)
pH: 8 (ref 5.0–8.0)

## 2023-05-17 LAB — DIFFERENTIAL
Abs Immature Granulocytes: 0.08 10*3/uL — ABNORMAL HIGH (ref 0.00–0.07)
Basophils Absolute: 0.1 10*3/uL (ref 0.0–0.1)
Basophils Relative: 1 %
Eosinophils Absolute: 0.2 10*3/uL (ref 0.0–0.5)
Eosinophils Relative: 2 %
Immature Granulocytes: 1 %
Lymphocytes Relative: 9 %
Lymphs Abs: 1.1 10*3/uL (ref 0.7–4.0)
Monocytes Absolute: 0.3 10*3/uL (ref 0.1–1.0)
Monocytes Relative: 3 %
Neutro Abs: 9.7 10*3/uL — ABNORMAL HIGH (ref 1.7–7.7)
Neutrophils Relative %: 84 %
Smear Review: UNDETERMINED

## 2023-05-17 LAB — CBG MONITORING, ED: Glucose-Capillary: 115 mg/dL — ABNORMAL HIGH (ref 70–99)

## 2023-05-17 LAB — COMPREHENSIVE METABOLIC PANEL WITH GFR
ALT: 13 U/L (ref 0–44)
AST: 16 U/L (ref 15–41)
Albumin: 4.1 g/dL (ref 3.5–5.0)
Alkaline Phosphatase: 77 U/L (ref 38–126)
Anion gap: 8 (ref 5–15)
BUN: 8 mg/dL (ref 8–23)
CO2: 27 mmol/L (ref 22–32)
Calcium: 9 mg/dL (ref 8.9–10.3)
Chloride: 105 mmol/L (ref 98–111)
Creatinine, Ser: 0.57 mg/dL (ref 0.44–1.00)
GFR, Estimated: >60 mL/min (ref >60)
Glucose, Bld: 106 mg/dL — ABNORMAL HIGH (ref 70–99)
Potassium: 4.2 mmol/L (ref 3.5–5.1)
Sodium: 140 mmol/L (ref 135–145)
Total Bilirubin: 0.9 mg/dL (ref 0.0–1.2)
Total Protein: 7.6 g/dL (ref 6.5–8.1)

## 2023-05-17 LAB — CBC
HCT: 48.4 % — ABNORMAL HIGH (ref 36.0–46.0)
Hemoglobin: 16.7 g/dL — ABNORMAL HIGH (ref 12.0–15.0)
MCH: 32.4 pg (ref 26.0–34.0)
MCHC: 34.5 g/dL (ref 30.0–36.0)
MCV: 94 fL (ref 80.0–100.0)
Platelets: UNDETERMINED 10*3/uL (ref 150–400)
RBC: 5.15 MIL/uL — ABNORMAL HIGH (ref 3.87–5.11)
RDW: 12.6 % (ref 11.5–15.5)
WBC: 11.5 10*3/uL — ABNORMAL HIGH (ref 4.0–10.5)
nRBC: 0 % (ref 0.0–0.2)

## 2023-05-17 LAB — PROTIME-INR
INR: 1.5 — ABNORMAL HIGH (ref 0.8–1.2)
Prothrombin Time: 18.6 s — ABNORMAL HIGH (ref 11.4–15.2)

## 2023-05-17 LAB — LIPASE, BLOOD: Lipase: 25 U/L (ref 11–51)

## 2023-05-17 LAB — ETHANOL: Alcohol, Ethyl (B): <10 mg/dL (ref <10)

## 2023-05-17 LAB — APTT: aPTT: 33 s (ref 24–36)

## 2023-05-17 MED ORDER — SODIUM CHLORIDE 0.9 % IV BOLUS (SEPSIS)
500.0000 mL | Freq: Once | INTRAVENOUS | Status: AC
  Administered 2023-05-17: 500 mL via INTRAVENOUS

## 2023-05-17 MED ORDER — SODIUM CHLORIDE 0.9 % IV SOLN
1000.0000 mL | INTRAVENOUS | Status: DC
Start: 2023-05-17 — End: 2023-05-17

## 2023-05-17 MED ORDER — ONDANSETRON HCL 4 MG/2ML IJ SOLN
4.0000 mg | Freq: Once | INTRAMUSCULAR | Status: AC
  Administered 2023-05-17: 4 mg via INTRAVENOUS
  Filled 2023-05-17: qty 2

## 2023-05-17 MED ORDER — MECLIZINE HCL 25 MG PO TABS
25.0000 mg | ORAL_TABLET | Freq: Once | ORAL | Status: AC
  Administered 2023-05-17: 25 mg via ORAL
  Filled 2023-05-17: qty 1

## 2023-05-17 MED ORDER — ONDANSETRON 4 MG PO TBDP
4.0000 mg | ORAL_TABLET | Freq: Three times a day (TID) | ORAL | 0 refills | Status: AC | PRN
  Filled 2023-05-17: qty 12, 4d supply, fill #0

## 2023-05-17 MED ORDER — MECLIZINE HCL 25 MG PO TABS
12.5000 mg | ORAL_TABLET | Freq: Three times a day (TID) | ORAL | 0 refills | Status: DC | PRN
  Filled 2023-05-17: qty 15, 10d supply, fill #0

## 2023-05-17 NOTE — ED Provider Notes (Signed)
 [facility] Provider Note    Event Date/Time   First MD Initiated Contact with Patient 05/17/23 [phone]     (approximate)   History   Dizziness and Abdominal Pain   HPI  Jasmine [name] is a 63 y.o. female PMH high deficiency anemia, prior Nissen fundoplication, diverticulitis, prior sinus venous thrombosis, prior subarachnoid hemorrhage, hypertension, hyperlipidemia presents for evaluation of dizziness, abdominal pain -Per chart review, last seen in our emergency department in April 2024, diagnosed with diverticulitis at that time, uncomplicated, discharged on antibiotics. -Today, patient states she woke up from sleep around 3-5 a.m. with vertigo and disequilibrium.  No presyncope.  Has had similar episodes in the past, most recently on Saturday, self resolved.  No recent infectious symptoms though does endorse some urinary frequency. -No headache, no proceeding trauma -No ear pain or tinnitus -Feels it is worse when she looks to the left     Physical Exam   Triage Vital Signs: ED Triage Vitals  Encounter Vitals Group     BP 05/17/23 0827 (!) 197/103     Systolic BP Percentile --      Diastolic BP Percentile --      Pulse Rate 05/17/23 0827 63     Resp 05/17/23 0827 18     Temp 05/17/23 0827 98.4 F (36.9 C)     Temp Source 05/17/23 0827 Oral     SpO2 05/17/23 0827 100 %     Weight 05/17/23 0829 190 lb 0.6 oz (86.2 kg)     Height 05/17/23 0829 5\' 3"  (1.6 m)     Head Circumference --      Peak Flow --      Pain Score 05/17/23 0828 1     Pain Loc --      Pain Education --      Exclude from Growth Chart --     Most recent vital signs: Vitals:   05/17/23 1200 05/17/23 1230  BP: (!) 177/85 (!) 186/87  Pulse: (!) 57 67  Resp:    Temp:    SpO2:       General: Awake, no distress.  CV:  Good peripheral perfusion.  Regular rate and rhythm, RP 2+. Resp:  Normal effort.  CTAB. Abd:  No distention.  Neuro:  Cranial nerves II through XII intact,  finger-nose-finger normal bilaterally extremities, grip 5/5 bilateral upper extremities, lower extremities antigravity 5+ seconds no drift, bilateral upper extremities antigravity 5+ seconds no drift.  Chelle FHL 5/5 bilateral lower extremities.  Ambulates with steady gait.  Negative Romberg.  Positive Dix-Hallpike on left.   ED Results / Procedures / Treatments   Labs (all labs ordered are listed, but only abnormal results are displayed) Labs Reviewed  CBC - Abnormal; Notable for the following components:      Result Value   WBC 11.5 (*)    RBC 5.15 (*)    Hemoglobin 16.7 (*)    HCT 48.4 (*)    All other components within normal limits  DIFFERENTIAL - Abnormal; Notable for the following components:   Neutro Abs 9.7 (*)    Abs Immature Granulocytes 0.08 (*)    All other components within normal limits  URINALYSIS, ROUTINE W REFLEX MICROSCOPIC - Abnormal; Notable for the following components:   Color, Urine STRAW (*)    APPearance CLEAR (*)    Hgb urine dipstick SMALL (*)    Ketones, ur 5 (*)    Bacteria, UA MANY (*)    All  other components within normal limits  PROTIME-INR - Abnormal; Notable for the following components:   Prothrombin Time 18.6 (*)    INR 1.5 (*)    All other components within normal limits  COMPREHENSIVE METABOLIC PANEL WITH GFR - Abnormal; Notable for the following components:   Glucose, Bld 106 (*)    All other components within normal limits  CBG MONITORING, ED - Abnormal; Notable for the following components:   Glucose-Capillary 115 (*)    All other components within normal limits  ETHANOL  APTT  LIPASE, BLOOD     EKG  Ecg = sinus rhythm, rate 71, no appreciable ST elevation or depression, no significant repolarization abnormality, normal axis, normal intervals.  No evidence of ischemia or arrhythmia on my read.   RADIOLOGY See ED course below.  PROCEDURES:  Critical Care performed: No  Procedures   MEDICATIONS ORDERED IN  ED: Medications  meclizine (ANTIVERT) tablet 25 mg (25 mg Oral Given 05/17/23 1005)  ondansetron (ZOFRAN) injection 4 mg (4 mg Intravenous Given 05/17/23 1002)  sodium chloride 0.9 % bolus 500 mL (0 mLs Intravenous Stopped 05/17/23 1136)     IMPRESSION / MDM / ASSESSMENT AND PLAN / ED COURSE  I reviewed the triage vital signs and the nursing notes.                              Differential diagnosis includes, but is not limited to, likely peripheral etiology of vertigo, suspect BPPV, consider Mnire's or labyrinthitis.  Doubt acute intracranial pathology though given history of prior sinus venous thrombosis and patient currently on anticoagulation I do believe screening CT head is reasonable in this 63 year old female.  Fortunately unremarkable.  Basic labs pending, will screen for underlying metabolic abnormality.  Will treat with meclizine, IV fluid, Zofran in the meantime and reassess.  Anticipate discharge home assuming unremarkable workup.  No clear evidence to suspect CVA at this time, highly doubt posterior stroke given no ataxia though we will reassess if no improvement.  Patient's presentation is most consistent with acute complicated illness / injury requiring diagnostic workup.  The patient is on the cardiac monitor to evaluate for evidence of arrhythmia and/or significant heart rate changes.  ED course below.  Workup overall unremarkable--no significant electrolyte abnormalities, evidence of UTI, CT head negative.  See ED course below for further discussion.  Overall suspect peripheral etiology, likely BPPV.  Rx meclizine, Zofran, provided information on Epley maneuver.  Plan for PMD follow-up.  ED return precautions in place.  Patient agrees with plan.   Clinical Course as of 05/17/23 1246  Wed May 17, 2023  1610 CT head reviewed and interpreted by myself, unremarkable.  Formal radiology read below  IMPRESSION: Normal for age noncontrast Head CT.   [MM]  1032 CBC overall  remarkable, mild leukocytosis nonspecific.  Little hemoconcentration noted, similar to prior. [MM]  1241 Urinalysis not consistent with infection [MM]  1243 Patient reevaluated, reassured by unremarkable workup.  Dizziness is notably improved, remains ambulatory without assistance.  Given nonfocal neurologic exam and no ataxia, do not suspect underlying posterior stroke at this time.  Presentation overall most consistent with likely peripheral etiology, suspect BPPV.  Will Rx meclizine, Zofran and provide information on Epley maneuvers.  Plan for PMD follow-up.  ED return precautions in place.  Patient agrees with plan. [MM]    Clinical Course User Index [MM] Marinell Blight, MD     FINAL CLINICAL IMPRESSION(S) /  ED DIAGNOSES   Final diagnoses:  Dizziness  Vertigo     Rx / DC Orders   ED Discharge Orders          Ordered    meclizine (ANTIVERT) 25 MG tablet  3 times daily PRN        05/17/23 1244    ondansetron (ZOFRAN-ODT) 4 MG disintegrating tablet  Every 8 hours PRN        05/17/23 1244             Note:  This document was prepared using Dragon voice recognition software and may include unintentional dictation errors.   Marinell Blight, MD 05/17/23 1246

## 2023-05-17 NOTE — ED Triage Notes (Addendum)
 Pt states dizziness and endorses HX of vertigo. Pt denies weakness or visual disturbances. Pt states ABD pain intermittently endorses HX of diverticulitis. NAD noted. Pt A&Ox4.   Pt states she takes Xarletto for a HX of blood clots "in the back on my head".

## 2023-05-17 NOTE — Discharge Instructions (Signed)
 Your evaluation in the emergency department was overall reassuring.  I do suspect you likely have an issue with your inner ear, and I have provided you information on exercises you can do at home to help with this.  I have also prescribed you a dizziness and nausea medication to use as needed.  Please do follow-up with your primary care doctor for reevaluation, and return to the emergency department with any new or worsening symptoms.

## 2023-05-23 NOTE — Telephone Encounter (Signed)
 Copied from CRM [id number]. Topic: Clinical - Medical Advice >> May 23, 2023 12:36 PM Jasmine [name] wrote: Reason for CRM: Pt got her medicaid card and Dr Harvest Dark is on it as her PCP and that is who she wants to see. Please follow up with pt and advise. She also needs to make a appt

## 2023-05-25 ENCOUNTER — Encounter: Payer: Self-pay | Admitting: Nurse Practitioner

## 2023-05-26 MED ORDER — MECLIZINE HCL 25 MG PO TABS
12.5000 mg | ORAL_TABLET | Freq: Three times a day (TID) | ORAL | 0 refills | Status: AC | PRN
Start: 2023-05-26 — End: ?

## 2023-05-26 NOTE — Telephone Encounter (Signed)
 Patient states that she read on [employer] that meclizine may be purchased over the counter. She is requesting confirmation on whether she shoul purchase it OTC or obtain a prescription.  Please call in prescription as requested or follow up with patient via Mychart today.Marland Kitchen

## 2023-05-26 NOTE — Telephone Encounter (Signed)
 Patient's PCP changed as requested and appointment scheduled.

## 2023-05-27 DIAGNOSIS — Z419 Encounter for procedure for purposes other than remedying health state, unspecified: Secondary | ICD-10-CM | POA: Diagnosis not present

## 2023-05-27 NOTE — Patient Instructions (Signed)
 Dizziness Dizziness is a common problem. It makes you feel unsteady or light-headed. You may feel like you're about to faint. Dizziness can lead to getting hurt if you stumble or fall. It's more common to feel dizzy if you're an older adult. Many things can cause you to feel dizzy. These include: Medicines. Dehydration. This is when there's not enough water in your body. Illness. Follow these instructions at home: Eating and drinking  Drink enough fluid to keep your pee (urine) pale yellow. This helps keep you from getting dehydrated. Try to drink more clear fluids, such as water. Do not drink alcohol. Try to limit how much caffeine you take in. Try to limit how much salt, also called sodium, you take in. Activity Try not to make quick movements. Stand up slowly from sitting in a chair. Steady yourself until you feel okay. In the morning, first sit up on the side of the bed. When you feel okay, hold onto something and slowly stand up. Do this until you know that your balance is okay. If you need to stand in one place for a long time, move your legs often. Tighten and relax the muscles in your legs while you're standing. Do not drive or use machines if you feel dizzy. Avoid bending down if you feel dizzy. Place items in your home so you can reach them without leaning over. Lifestyle Do not smoke, vape, or use products with nicotine or tobacco in them. If you need help quitting, talk with your health care provider. Try to lower your stress level. You can do this by using methods like yoga or meditation. Talk with your provider if you need help. General instructions Watch your dizziness for any changes. Take your medicines only as told by your provider. Talk with your provider if you think you're dizzy because of a medicine you're taking. Tell a friend or a family member that you're feeling dizzy. If they spot any changes in your behavior, have them call your provider. Contact a health care  provider if: Your dizziness doesn't go away, or you have new symptoms. Your dizziness gets worse. You feel like you may vomit. You have trouble hearing. You have a fever. You have neck pain or a stiff neck. You fall or get hurt. Get help right away if: You vomit each time you eat or drink. You have watery poop and can't eat or drink. You have trouble talking, walking, swallowing, or using your arms, hands, or legs. You feel very weak. You're bleeding. You're not thinking clearly, or you have trouble forming sentences. A friend or family member may spot this. Your vision changes, or you get a very bad headache. These symptoms may be an emergency. Call 911 right away. Do not wait to see if the symptoms will go away. Do not drive yourself to the hospital. This information is not intended to replace advice given to you by your health care provider. Make sure you discuss any questions you have with your health care provider. Document Revised: 11/03/2022 Document Reviewed: 03/17/2022 Elsevier Patient Education  2024 [employer].

## 2023-05-30 ENCOUNTER — Ambulatory Visit (INDEPENDENT_AMBULATORY_CARE_PROVIDER_SITE_OTHER): Admitting: Nurse Practitioner

## 2023-05-30 ENCOUNTER — Encounter: Payer: Self-pay | Admitting: Nurse Practitioner

## 2023-05-30 VITALS — BP 136/80 | HR 60 | Temp 98.0°F | Ht 63.5 in | Wt 186.9 lb

## 2023-05-30 DIAGNOSIS — R399 Unspecified symptoms and signs involving the genitourinary system: Secondary | ICD-10-CM | POA: Diagnosis not present

## 2023-05-30 DIAGNOSIS — Z136 Encounter for screening for cardiovascular disorders: Secondary | ICD-10-CM | POA: Diagnosis not present

## 2023-05-30 DIAGNOSIS — Z86718 Personal history of other venous thrombosis and embolism: Secondary | ICD-10-CM

## 2023-05-30 DIAGNOSIS — E669 Obesity, unspecified: Secondary | ICD-10-CM | POA: Insufficient documentation

## 2023-05-30 DIAGNOSIS — M5481 Occipital neuralgia: Secondary | ICD-10-CM | POA: Diagnosis not present

## 2023-05-30 DIAGNOSIS — Z1159 Encounter for screening for other viral diseases: Secondary | ICD-10-CM

## 2023-05-30 DIAGNOSIS — Z13220 Encounter for screening for lipoid disorders: Secondary | ICD-10-CM

## 2023-05-30 DIAGNOSIS — R7301 Impaired fasting glucose: Secondary | ICD-10-CM

## 2023-05-30 DIAGNOSIS — E66811 Obesity, class 1: Secondary | ICD-10-CM

## 2023-05-30 DIAGNOSIS — Z6832 Body mass index (BMI) 32.0-32.9, adult: Secondary | ICD-10-CM | POA: Diagnosis not present

## 2023-05-30 DIAGNOSIS — R8281 Pyuria: Secondary | ICD-10-CM | POA: Diagnosis not present

## 2023-05-30 DIAGNOSIS — R42 Dizziness and giddiness: Secondary | ICD-10-CM | POA: Diagnosis not present

## 2023-05-30 DIAGNOSIS — E6609 Other obesity due to excess calories: Secondary | ICD-10-CM

## 2023-05-30 LAB — WET PREP FOR TRICH, YEAST, CLUE
Clue Cell Exam: NEGATIVE
Trichomonas Exam: NEGATIVE
Yeast Exam: NEGATIVE

## 2023-05-30 LAB — MICROSCOPIC EXAMINATION

## 2023-05-30 LAB — URINALYSIS, ROUTINE W REFLEX MICROSCOPIC
Bilirubin, UA: NEGATIVE
Glucose, UA: NEGATIVE
Ketones, UA: NEGATIVE
Leukocytes,UA: NEGATIVE
Nitrite, UA: NEGATIVE
Protein,UA: NEGATIVE
Specific Gravity, UA: 1.01 (ref 1.005–1.030)
Urobilinogen, Ur: 0.2 mg/dL (ref 0.2–1.0)
pH, UA: 6.5 (ref 5.0–7.5)

## 2023-05-30 NOTE — Assessment & Plan Note (Signed)
 Acute with no further episodes.  Continue Meclizine as needed which offers benefit and will place referral to local neurologist due to transportation issues traveling to [facility].  Neuro exam reassuring today.

## 2023-05-30 NOTE — Assessment & Plan Note (Signed)
 Chronic, ongoing.  Followed by [facility] neurology, but would like local neurologist as does not drive.  Will place referral for local neurology provider and continue collaboration due to with them.

## 2023-05-30 NOTE — Assessment & Plan Note (Signed)
BMI 32.59.  Recommended eating smaller high protein, low fat meals more frequently and exercising 30 mins a day 5 times a week with a goal of 10-15lb weight loss in the next 3 months. Patient voiced their understanding and motivation to adhere to these recommendations.

## 2023-05-30 NOTE — Progress Notes (Signed)
 BP 136/80 (BP Location: Left Arm, Patient Position: Sitting, Cuff Size: Normal)   Pulse 60   Temp 98 F (36.7 C) (Oral)   Ht 5' 3.5" (1.613 m)   Wt 186 lb 14.4 oz (84.8 kg)   SpO2 96%   BMI 32.59 kg/m    Subjective:    Patient ID: Jasmine [name], female    DOB: 10/12/1960, 63 y.o.   MRN: [id number]  HPI: Jasmine [name] is a 63 y.o. female  Chief Complaint  Patient presents with   ER Follow Up    Patient states she was recently seen at the ER for her dizziness and vertigo. States she was told she had some bacteria in her urine when she was there. States she is not currently having any dizziness or UTI symptoms.    ER FOLLOW UP Seen in ER on 05/17/23 for vertigo. This is the second time she had it in a few days and then another episode 05/25/23, but no further episodes of this.  She was concerned for blood clot at the time. History of blood clots to brain, first 2013 and second 2018 (5 years to the week of past one). Took Coumadin in past. Currently takes Xarelto. Currently takes Meclizine as needed.  This does help.  Has occipital neuralgia, follows with [facility] neurology with last visit 03/25/22.  At work she does a lot of lifting, [employer].  This will pull on neck and cause symptoms with occipital neuralgia.   Took Gabapentin and Lyrica in past, but she stopped these in 2024.  Took Amlodipine in past and this caused GI issues. Time since discharge:  Hospital/facility: [facility] Diagnosis: Vertigo Procedures/tests: CT scan Consultants: none New medications: none Discharge instructions:  Follow-up with PCP Status: stable   Relevant past medical, surgical, family and social history reviewed and updated as indicated. Interim medical history since our last visit reviewed. Allergies and medications reviewed and updated.  Review of Systems  Constitutional:  Negative for activity change, appetite change, diaphoresis, fatigue and fever.  Respiratory:  Negative for cough, chest tightness and  shortness of breath.   Cardiovascular:  Negative for chest pain, palpitations and leg swelling.  Gastrointestinal: Negative.   Neurological:  Positive for dizziness. Negative for syncope, weakness, light-headedness, numbness and headaches.  Psychiatric/Behavioral: Negative.      Per HPI unless specifically indicated above     Objective:    BP 136/80 (BP Location: Left Arm, Patient Position: Sitting, Cuff Size: Normal)   Pulse 60   Temp 98 F (36.7 C) (Oral)   Ht 5' 3.5" (1.613 m)   Wt 186 lb 14.4 oz (84.8 kg)   SpO2 96%   BMI 32.59 kg/m   Wt Readings from Last 3 Encounters:  05/30/23 186 lb 14.4 oz (84.8 kg)  05/17/23 190 lb 0.6 oz (86.2 kg)  03/24/23 190 lb (86.2 kg)    Physical Exam Vitals and nursing note reviewed.  Constitutional:      General: She is awake. She is not in acute distress.    Appearance: She is well-developed and well-groomed. She is obese. She is not ill-appearing or toxic-appearing.  HENT:     Head: Normocephalic.     Right Ear: Hearing and external ear normal.     Left Ear: Hearing and external ear normal.  Eyes:     General: Lids are normal.        Right eye: No discharge.        Left eye: No discharge.  Conjunctiva/sclera: Conjunctivae normal.     Pupils: Pupils are equal, round, and reactive to light.  Neck:     Thyroid: No thyromegaly.     Vascular: No carotid bruit.  Cardiovascular:     Rate and Rhythm: Normal rate and regular rhythm.     Heart sounds: Normal heart sounds. No murmur heard.    No gallop.  Pulmonary:     Effort: Pulmonary effort is normal. No accessory muscle usage or respiratory distress.     Breath sounds: Normal breath sounds.  Abdominal:     General: Bowel sounds are normal. There is no distension.     Palpations: Abdomen is soft.     Tenderness: There is no abdominal tenderness.  Musculoskeletal:     Cervical back: Normal range of motion and neck supple.     Right lower leg: No edema.     Left lower leg: No  edema.  Lymphadenopathy:     Cervical: No cervical adenopathy.  Skin:    General: Skin is warm and dry.  Neurological:     Mental Status: She is alert and oriented to person, place, and time.     Cranial Nerves: Cranial nerves 2-12 are intact.     Motor: Motor function is intact.     Coordination: Coordination is intact.     Gait: Gait is intact.     Deep Tendon Reflexes: Reflexes are normal and symmetric.     Reflex Scores:      Brachioradialis reflexes are 2+ on the right side and 2+ on the left side.      Patellar reflexes are 2+ on the right side and 2+ on the left side. Psychiatric:        Attention and Perception: Attention normal.        Mood and Affect: Mood normal.        Speech: Speech normal.        Behavior: Behavior normal. Behavior is cooperative.        Thought Content: Thought content normal.    Results for orders placed or performed in visit on 05/30/23  WET PREP FOR TRICH, YEAST, CLUE   Collection Time: 05/30/23  2:14 PM   Specimen: Urine   Urine  Result Value Ref Range   Trichomonas Exam Negative Negative   Yeast Exam Negative Negative   Clue Cell Exam Negative Negative  Microscopic Examination   Collection Time: 05/30/23  2:14 PM   Urine  Result Value Ref Range   WBC, UA 0-5 0 - 5 /hpf   RBC, Urine 0-2 0 - 2 /hpf   Epithelial Cells (non renal) 0-10 0 - 10 /hpf   Bacteria, UA Few None seen/Few  Urinalysis, Routine w reflex microscopic   Collection Time: 05/30/23  2:14 PM  Result Value Ref Range   Specific Gravity, UA 1.010 1.005 - 1.030   pH, UA 6.5 5.0 - 7.5   Color, UA Straw Yellow   Appearance Ur Clear Clear   Leukocytes,UA Negative Negative   Protein,UA Negative Negative/Trace   Glucose, UA Negative Negative   Ketones, UA Negative Negative   RBC, UA Trace (A) Negative   Bilirubin, UA Negative Negative   Urobilinogen, Ur 0.2 0.2 - 1.0 mg/dL   Nitrite, UA Negative Negative   Microscopic Examination See below:       Assessment & Plan:    Problem List Items Addressed This Visit       Nervous and Auditory   Occipital neuralgia of right  side - Primary   Chronic, ongoing.  Followed by [facility] neurology, but would like local neurologist as does not drive.  Will place referral for local neurology provider and continue collaboration due to with them.      Relevant Orders   Ambulatory referral to Neurology     Other   History of cerebral venous sinus thrombosis   Stable.  Continue Xarelto.  Followed by [facility] neurology, but would like local neurologist as does not drive.  Will place referral for local neurology provider and continue collaboration due to with them.      Relevant Orders   Ambulatory referral to Neurology   Obesity   BMI 32.59.  Recommended eating smaller high protein, low fat meals more frequently and exercising 30 mins a day 5 times a week with a goal of 10-15lb weight loss in the next 3 months. Patient voiced their understanding and motivation to adhere to these recommendations.       Vertigo   Acute with no further episodes.  Continue Meclizine as needed which offers benefit and will place referral to local neurologist due to transportation issues traveling to [facility].  Neuro exam reassuring today.      Relevant Orders   Comprehensive metabolic panel with GFR   TSH   Ambulatory referral to Neurology   Other Visit Diagnoses       IFG (impaired fasting glucose)       Noted recent labs, check A1c today.   Relevant Orders   HgB A1c     Urinary symptom or sign       Recheck urine and treat as needed.   Relevant Orders   WET PREP FOR TRICH, YEAST, CLUE (Completed)   Urinalysis, Routine w reflex microscopic (Completed)     Pyuria       Urine sent for culture.   Relevant Orders   Urine Culture     Encounter for lipid screening for cardiovascular disease       Lipid panel today.   Relevant Orders   Lipid Panel w/o Chol/HDL Ratio     Need for hepatitis C screening test       Hep C screening today,  discussed with patient.   Relevant Orders   Hepatitis C antibody        Follow up plan: Return in about 4 weeks (around 06/27/2023) for VERTIGO.

## 2023-05-30 NOTE — Assessment & Plan Note (Signed)
 Stable.  Continue Xarelto.  Followed by [facility] neurology, but would like local neurologist as does not drive.  Will place referral for local neurology provider and continue collaboration due to with them.

## 2023-05-31 ENCOUNTER — Encounter: Payer: Self-pay | Admitting: Nurse Practitioner

## 2023-05-31 LAB — LIPID PANEL W/O CHOL/HDL RATIO
Cholesterol, Total: 252 mg/dL — ABNORMAL HIGH (ref 100–199)
HDL: 57 mg/dL (ref >39)
LDL Chol Calc (NIH): 174 mg/dL — ABNORMAL HIGH (ref 0–99)
Triglycerides: 117 mg/dL (ref 0–149)
VLDL Cholesterol Cal: 21 mg/dL (ref 5–40)

## 2023-05-31 LAB — COMPREHENSIVE METABOLIC PANEL WITH GFR
ALT: 11 IU/L (ref 0–32)
AST: 18 IU/L (ref 0–40)
Albumin: 4.4 g/dL (ref 3.9–4.9)
Alkaline Phosphatase: 97 IU/L (ref 44–121)
BUN/Creatinine Ratio: 10 — ABNORMAL LOW (ref 12–28)
BUN: 7 mg/dL — ABNORMAL LOW (ref 8–27)
Bilirubin Total: 0.5 mg/dL (ref 0.0–1.2)
CO2: 22 mmol/L (ref 20–29)
Calcium: 9.5 mg/dL (ref 8.7–10.3)
Chloride: 101 mmol/L (ref 96–106)
Creatinine, Ser: 0.69 mg/dL (ref 0.57–1.00)
Globulin, Total: 2.8 g/dL (ref 1.5–4.5)
Glucose: 91 mg/dL (ref 70–99)
Potassium: 4.2 mmol/L (ref 3.5–5.2)
Sodium: 141 mmol/L (ref 134–144)
Total Protein: 7.2 g/dL (ref 6.0–8.5)
eGFR: 99 mL/min/{1.73_m2} (ref >59)

## 2023-05-31 LAB — HEPATITIS C ANTIBODY: Hep C Virus Ab: NONREACTIVE

## 2023-05-31 LAB — HEMOGLOBIN A1C
Est. average glucose Bld gHb Est-mCnc: 108 mg/dL
Hgb A1c MFr Bld: 5.4 % (ref 4.8–5.6)

## 2023-05-31 LAB — TSH: TSH: 2.28 u[IU]/mL (ref 0.450–4.500)

## 2023-05-31 NOTE — Progress Notes (Signed)
 Contacted via MyChart The 10-year ASCVD risk score (Arnett DK, et al., 2019) is: 6.4%   Values used to calculate the score:     Age: 63 years     Sex: Female     Is Non-Hispanic African American: No     Diabetic: No     Tobacco smoker: No     Systolic Blood Pressure: 136 mmHg     Is BP treated: Yes     HDL Cholesterol: 57 mg/dL     Total Cholesterol: 252 mg/dL   Good afternoon Jasmine [name], your labs have returned: - Kidney function, creatinine and eGFR, remains normal, as is liver function, AST and ALT.  - Thyroid level normal. - Hep C is negative. - A1c shows no diabetes or prediabetes:) - Lipid panel does show elevation in LDL, bad cholesterol, and total cholesterol.  At this time you do not need medication, but in future you may.  I will show you more information at next visit.  For now focus on healthy diet changes and regular exercise.  The American Heart Association has a plethora of information on this and recipes.  Any questions? Keep being awesome!!  Thank you for allowing me to participate in your care.  I appreciate you. Kindest regards, Nashly Olsson

## 2023-06-01 LAB — URINE CULTURE

## 2023-06-26 ENCOUNTER — Inpatient Hospital Stay: Payer: Medicaid Other | Attending: Oncology

## 2023-06-26 ENCOUNTER — Other Ambulatory Visit: Payer: Medicaid Other

## 2023-06-26 ENCOUNTER — Encounter (HOSPITAL_COMMUNITY): Payer: Self-pay

## 2023-06-26 DIAGNOSIS — Z419 Encounter for procedure for purposes other than remedying health state, unspecified: Secondary | ICD-10-CM | POA: Diagnosis not present

## 2023-06-26 DIAGNOSIS — D509 Iron deficiency anemia, unspecified: Secondary | ICD-10-CM | POA: Diagnosis not present

## 2023-06-26 DIAGNOSIS — Z86718 Personal history of other venous thrombosis and embolism: Secondary | ICD-10-CM

## 2023-06-26 LAB — IRON AND TIBC
Iron: 81 ug/dL (ref 28–170)
Saturation Ratios: 22 % (ref 10.4–31.8)
TIBC: 368 ug/dL (ref 250–450)
UIBC: 287 ug/dL

## 2023-06-26 LAB — CBC
HCT: 45.8 % (ref 36.0–46.0)
Hemoglobin: 15.2 g/dL — ABNORMAL HIGH (ref 12.0–15.0)
MCH: 31.7 pg (ref 26.0–34.0)
MCHC: 33.2 g/dL (ref 30.0–36.0)
MCV: 95.4 fL (ref 80.0–100.0)
Platelets: 232 10*3/uL (ref 150–400)
RBC: 4.8 MIL/uL (ref 3.87–5.11)
RDW: 12.6 % (ref 11.5–15.5)
WBC: 7.5 10*3/uL (ref 4.0–10.5)
nRBC: 0 % (ref 0.0–0.2)

## 2023-06-26 LAB — FERRITIN: Ferritin: 76 ng/mL (ref 11–307)

## 2023-06-27 LAB — ERYTHROPOIETIN: Erythropoietin: 9 m[IU]/mL (ref 2.6–18.5)

## 2023-06-30 LAB — JAK2 GENOTYPR

## 2023-07-01 NOTE — Patient Instructions (Addendum)
 Please call to schedule your mammogram and/or bone density: [facility] at [facility]  Address: [address], [place], [place] [address] Phone: [phone]  [facility] [address] [place],  [place]  [address] Phone: [phone]    Dizziness Dizziness is a common problem. It makes you feel unsteady or light-headed. You may feel like you're about to faint. Dizziness can lead to getting hurt if you stumble or fall. It's more common to feel dizzy if you're an older adult. Many things can cause you to feel dizzy. These include: Medicines. Dehydration. This is when there's not enough water in your body. Illness. Follow these instructions at home: Eating and drinking  Drink enough fluid to keep your pee (urine) pale yellow. This helps keep you from getting dehydrated. Try to drink more clear fluids, such as water. Do not drink alcohol. Try to limit how much caffeine you take in. Try to limit how much salt, also called sodium, you take in. Activity Try not to make quick movements. Stand up slowly from sitting in a chair. Steady yourself until you feel okay. In the morning, first sit up on the side of the bed. When you feel okay, hold onto something and slowly stand up. Do this until you know that your balance is okay. If you need to stand in one place for a long time, move your legs often. Tighten and relax the muscles in your legs while you're standing. Do not drive or use machines if you feel dizzy. Avoid bending down if you feel dizzy. Place items in your home so you can reach them without leaning over. Lifestyle Do not smoke, vape, or use products with nicotine or tobacco in them. If you need help quitting, talk with your health care provider. Try to lower your stress level. You can do this by using methods like yoga or meditation. Talk with your provider if you need help. General instructions Watch your dizziness  for any changes. Take your medicines only as told by your provider. Talk with your provider if you think you're dizzy because of a medicine you're taking. Tell a friend or a family member that you're feeling dizzy. If they spot any changes in your behavior, have them call your provider. Contact a health care provider if: Your dizziness doesn't go away, or you have new symptoms. Your dizziness gets worse. You feel like you may vomit. You have trouble hearing. You have a fever. You have neck pain or a stiff neck. You fall or get hurt. Get help right away if: You vomit each time you eat or drink. You have watery poop and can't eat or drink. You have trouble talking, walking, swallowing, or using your arms, hands, or legs. You feel very weak. You're bleeding. You're not thinking clearly, or you have trouble forming sentences. A friend or family member may spot this. Your vision changes, or you get a very bad headache. These symptoms may be an emergency. Call 911 right away. Do not wait to see if the symptoms will go away. Do not drive yourself to the hospital. This information is not intended to replace advice given to you by your health care provider. Make sure you discuss any questions you have with your health care provider. Document Revised: 11/03/2022 Document Reviewed: 03/17/2022 Elsevier Patient Education  2024 [employer].

## 2023-07-03 ENCOUNTER — Encounter: Payer: Self-pay | Admitting: Nurse Practitioner

## 2023-07-03 ENCOUNTER — Ambulatory Visit (INDEPENDENT_AMBULATORY_CARE_PROVIDER_SITE_OTHER): Admitting: Nurse Practitioner

## 2023-07-03 VITALS — BP 128/86 | HR 64 | Temp 98.1°F | Wt 183.0 lb

## 2023-07-03 DIAGNOSIS — Z1231 Encounter for screening mammogram for malignant neoplasm of breast: Secondary | ICD-10-CM

## 2023-07-03 DIAGNOSIS — R42 Dizziness and giddiness: Secondary | ICD-10-CM

## 2023-07-03 DIAGNOSIS — I10 Essential (primary) hypertension: Secondary | ICD-10-CM

## 2023-07-03 DIAGNOSIS — M5481 Occipital neuralgia: Secondary | ICD-10-CM

## 2023-07-03 DIAGNOSIS — Z86718 Personal history of other venous thrombosis and embolism: Secondary | ICD-10-CM

## 2023-07-03 DIAGNOSIS — R011 Cardiac murmur, unspecified: Secondary | ICD-10-CM | POA: Diagnosis not present

## 2023-07-03 NOTE — Assessment & Plan Note (Signed)
 Chronic, initial BP elevated but repeat closer to goal.  Suspect some white coat hypertension is present.  Recommend she monitor BP at least a few mornings a week at home and document.  DASH diet at home.  Labs today: up to date.  Return in 6 months.

## 2023-07-03 NOTE — Assessment & Plan Note (Signed)
 Improved.  Continue Meclizine  as needed which offers benefit and scheduled to see local neurology group in June.  Neuro exam reassuring today.

## 2023-07-03 NOTE — Progress Notes (Signed)
 BP 128/86 (BP Location: Left Arm, Patient Position: Sitting, Cuff Size: Normal)   Pulse 64   Temp 98.1 F (36.7 C) (Oral)   Wt 183 lb (83 kg)   SpO2 95%   BMI 31.91 kg/m    Subjective:    Patient ID: Jasmine [name], female    DOB: 11-Jul-1960, 63 y.o.   MRN: [id number]  HPI: Jasmine [name] is a 63 y.o. female  Chief Complaint  Patient presents with   Dizziness   DIZZINESS Follow-up today for vertigo which she was seen in ER for on 05/17/23 and then in office 05/30/23. History of blood clots to brain, first 2013 and second 2018 (5 years to the week of past one). Took Coumadin in past. Currently takes Xarelto . Follows with neurology at [facility], but is changing over to local due to transportation. Currently dizziness is much better, has not had to take Meclizine  in a couple weeks.  Times she feels like may happen but then it does not. Often her dizziness will take place in evening. Her grandmother had these episodes. Duration: chronic with intermittent flares Description of symptoms: ill-defined Duration of episode: hours Dizziness frequency: recurrent Provoking factors: none Aggravating factors:  none Triggered by rolling over in bed: yes Triggered by bending over: occasional -- if moves too fast Aggravated by head movement: yes Aggravated by exertion, coughing, loud noises: no Recent head injury: no Recent or current viral symptoms: no History of vasovagal episodes: no Nausea: yes Vomiting: no Tinnitus: occasional Hearing loss: no Aural fullness: no Headache: not too often Photophobia/phonophobia: no Unsteady gait: yes Postural instability: no Diplopia, dysarthria, dysphagia or weakness: no Related to exertion: no Pallor: no Diaphoresis: no Dyspnea: no Chest pain: no   Relevant past medical, surgical, family and social history reviewed and updated as indicated. Interim medical history since our last visit reviewed. Allergies and medications reviewed and updated.  Review  of Systems  Constitutional:  Negative for activity change, appetite change, diaphoresis, fatigue and fever.  Respiratory:  Negative for cough, chest tightness and shortness of breath.   Cardiovascular:  Negative for chest pain, palpitations and leg swelling.  Gastrointestinal: Negative.   Endocrine: Negative for cold intolerance, heat intolerance, polydipsia, polyphagia and polyuria.  Neurological: Negative.   Psychiatric/Behavioral: Negative.      Per HPI unless specifically indicated above     Objective:     BP 128/86 (BP Location: Left Arm, Patient Position: Sitting, Cuff Size: Normal)   Pulse 64   Temp 98.1 F (36.7 C) (Oral)   Wt 183 lb (83 kg)   SpO2 95%   BMI 31.91 kg/m   Wt Readings from Last 3 Encounters:  07/03/23 183 lb (83 kg)  05/30/23 186 lb 14.4 oz (84.8 kg)  05/17/23 190 lb 0.6 oz (86.2 kg)    Physical Exam Vitals and nursing note reviewed.  Constitutional:      General: She is awake. She is not in acute distress.    Appearance: She is well-developed and well-groomed. She is obese. She is not ill-appearing or toxic-appearing.  HENT:     Head: Normocephalic.     Right Ear: Hearing and external ear normal.     Left Ear: Hearing and external ear normal.  Eyes:     General: Lids are normal.        Right eye: No discharge.        Left eye: No discharge.     Conjunctiva/sclera: Conjunctivae normal.     Pupils: Pupils  are equal, round, and reactive to light.  Neck:     Thyroid : No thyromegaly.     Vascular: No carotid bruit.  Cardiovascular:     Rate and Rhythm: Normal rate and regular rhythm.     Heart sounds: Murmur heard.     Systolic murmur is present with a grade of 2/6.     No gallop.  Pulmonary:     Effort: Pulmonary effort is normal. No accessory muscle usage or respiratory distress.     Breath sounds: Normal breath sounds.  Abdominal:     General: Bowel sounds are normal. There is no distension.     Palpations: Abdomen is soft.      Tenderness: There is no abdominal tenderness.  Musculoskeletal:     Cervical back: Normal range of motion and neck supple.     Right lower leg: No edema.     Left lower leg: No edema.  Lymphadenopathy:     Cervical: No cervical adenopathy.  Skin:    General: Skin is warm and dry.  Neurological:     Mental Status: She is alert and oriented to person, place, and time.     Cranial Nerves: Cranial nerves 2-12 are intact.     Motor: Motor function is intact.     Coordination: Coordination is intact.     Gait: Gait is intact.     Deep Tendon Reflexes: Reflexes are normal and symmetric.     Reflex Scores:      Brachioradialis reflexes are 2+ on the right side and 2+ on the left side.      Patellar reflexes are 2+ on the right side and 2+ on the left side. Psychiatric:        Attention and Perception: Attention normal.        Mood and Affect: Mood normal.        Speech: Speech normal.        Behavior: Behavior normal. Behavior is cooperative.        Thought Content: Thought content normal.     Results for orders placed or performed in visit on 06/26/23  Erythropoietin    Collection Time: 06/26/23  9:50 AM  Result Value Ref Range   Erythropoietin  9.0 2.6 - 18.5 mIU/mL  JAK2 genotypr   Collection Time: 06/26/23  9:50 AM  Result Value Ref Range   JAK2 GenotypR Comment    Director Review, JAK2 Comment    BACKGROUND: Comment   Iron  and TIBC   Collection Time: 06/26/23  9:50 AM  Result Value Ref Range   Iron  81 28 - 170 ug/dL   TIBC 409 811 - 914 ug/dL   Saturation Ratios 22 10.4 - 31.8 %   UIBC 287 ug/dL  Ferritin   Collection Time: 06/26/23  9:50 AM  Result Value Ref Range   Ferritin 76 11 - 307 ng/mL  CBC   Collection Time: 06/26/23  9:50 AM  Result Value Ref Range   WBC 7.5 4.0 - 10.5 K/uL   RBC 4.80 3.87 - 5.11 MIL/uL   Hemoglobin 15.2 (H) 12.0 - 15.0 g/dL   HCT 78.2 95.6 - 21.3 %   MCV 95.4 80.0 - 100.0 fL   MCH 31.7 26.0 - 34.0 pg   MCHC 33.2 30.0 - 36.0 g/dL   RDW  08.6 57.8 - 46.9 %   Platelets 232 150 - 400 K/uL   nRBC 0.0 0.0 - 0.2 %      Assessment & Plan:   Problem List  Items Addressed This Visit       Cardiovascular and Mediastinum   Essential hypertension   Chronic, initial BP elevated but repeat closer to goal.  Suspect some white coat hypertension is present.  Recommend she monitor BP at least a few mornings a week at home and document.  DASH diet at home.  Labs today: up to date.  Return in 6 months.         Nervous and Auditory   Occipital neuralgia of right side - Primary   Chronic, ongoing.  Followed by [facility] neurology, but will be establishing locally in June.  Will review those notes once available.  Dizziness improved at this time.        Other   Vertigo   Improved.  Continue Meclizine  as needed which offers benefit and scheduled to see local neurology group in June.  Neuro exam reassuring today.      History of cerebral venous sinus thrombosis   Stable.  Continue Xarelto .  Followed by [facility] neurology, but will be establishing locally in June.  Will review those notes once available.      Heart murmur, systolic   Noted on exams.  Systolic.  No current symptoms.  May benefit echo in future.      Other Visit Diagnoses       Encounter for screening mammogram for malignant neoplasm of breast       Mammogram order and discussed with patient.   Relevant Orders   MM 3D SCREENING MAMMOGRAM BILATERAL BREAST        Follow up plan: Return in about 22 weeks (around 12/04/2023) for HLD, HTN, Anemia, Occipital Neuralgia.

## 2023-07-03 NOTE — Assessment & Plan Note (Signed)
 Chronic, ongoing.  Followed by [facility] neurology, but will be establishing locally in June.  Will review those notes once available.  Dizziness improved at this time.

## 2023-07-03 NOTE — Assessment & Plan Note (Signed)
 Noted on exams.  Systolic.  No current symptoms.  May benefit echo in future.

## 2023-07-03 NOTE — Assessment & Plan Note (Signed)
 Stable.  Continue Xarelto .  Followed by [facility] neurology, but will be establishing locally in June.  Will review those notes once available.

## 2023-07-27 DIAGNOSIS — Z419 Encounter for procedure for purposes other than remedying health state, unspecified: Secondary | ICD-10-CM | POA: Diagnosis not present

## 2023-07-31 DIAGNOSIS — M5481 Occipital neuralgia: Secondary | ICD-10-CM | POA: Diagnosis not present

## 2023-07-31 DIAGNOSIS — Z86718 Personal history of other venous thrombosis and embolism: Secondary | ICD-10-CM | POA: Diagnosis not present

## 2023-07-31 DIAGNOSIS — G479 Sleep disorder, unspecified: Secondary | ICD-10-CM | POA: Diagnosis not present

## 2023-07-31 DIAGNOSIS — R42 Dizziness and giddiness: Secondary | ICD-10-CM | POA: Diagnosis not present

## 2023-07-31 DIAGNOSIS — Z1331 Encounter for screening for depression: Secondary | ICD-10-CM | POA: Diagnosis not present

## 2023-07-31 DIAGNOSIS — F411 Generalized anxiety disorder: Secondary | ICD-10-CM | POA: Diagnosis not present

## 2023-07-31 DIAGNOSIS — F4321 Adjustment disorder with depressed mood: Secondary | ICD-10-CM | POA: Diagnosis not present

## 2023-08-26 DIAGNOSIS — Z419 Encounter for procedure for purposes other than remedying health state, unspecified: Secondary | ICD-10-CM | POA: Diagnosis not present

## 2023-09-26 DIAGNOSIS — F4321 Adjustment disorder with depressed mood: Secondary | ICD-10-CM | POA: Diagnosis not present

## 2023-09-26 DIAGNOSIS — G479 Sleep disorder, unspecified: Secondary | ICD-10-CM | POA: Diagnosis not present

## 2023-09-26 DIAGNOSIS — R42 Dizziness and giddiness: Secondary | ICD-10-CM | POA: Diagnosis not present

## 2023-09-26 DIAGNOSIS — Z419 Encounter for procedure for purposes other than remedying health state, unspecified: Secondary | ICD-10-CM | POA: Diagnosis not present

## 2023-09-26 DIAGNOSIS — M5481 Occipital neuralgia: Secondary | ICD-10-CM | POA: Diagnosis not present

## 2023-09-26 DIAGNOSIS — F411 Generalized anxiety disorder: Secondary | ICD-10-CM | POA: Diagnosis not present

## 2023-09-26 DIAGNOSIS — Z86718 Personal history of other venous thrombosis and embolism: Secondary | ICD-10-CM | POA: Diagnosis not present

## 2023-09-28 ENCOUNTER — Encounter: Payer: Self-pay | Admitting: Nurse Practitioner

## 2023-10-27 DIAGNOSIS — Z419 Encounter for procedure for purposes other than remedying health state, unspecified: Secondary | ICD-10-CM | POA: Diagnosis not present

## 2023-11-06 ENCOUNTER — Other Ambulatory Visit: Payer: Self-pay | Admitting: Oncology

## 2023-11-06 DIAGNOSIS — Z86718 Personal history of other venous thrombosis and embolism: Secondary | ICD-10-CM

## 2023-11-20 ENCOUNTER — Ambulatory Visit
Admission: RE | Admit: 2023-11-20 | Discharge: 2023-11-20 | Disposition: A | Discharge status: Home / Self Care | Source: Ambulatory Visit | Attending: Nurse Practitioner | Admitting: Nurse Practitioner

## 2023-11-20 DIAGNOSIS — Z1231 Encounter for screening mammogram for malignant neoplasm of breast: Secondary | ICD-10-CM | POA: Diagnosis not present

## 2023-11-22 ENCOUNTER — Other Ambulatory Visit: Payer: Self-pay | Admitting: Nurse Practitioner

## 2023-11-22 DIAGNOSIS — R928 Other abnormal and inconclusive findings on diagnostic imaging of breast: Secondary | ICD-10-CM

## 2023-11-23 ENCOUNTER — Ambulatory Visit
Admission: RE | Admit: 2023-11-23 | Discharge: 2023-11-23 | Disposition: A | Source: Ambulatory Visit | Attending: Nurse Practitioner | Admitting: Nurse Practitioner

## 2023-11-23 ENCOUNTER — Encounter: Payer: Self-pay | Admitting: Nurse Practitioner

## 2023-11-23 ENCOUNTER — Ambulatory Visit
Admission: RE | Admit: 2023-11-23 | Discharge: 2023-11-23 | Disposition: A | Discharge status: Home / Self Care | Source: Ambulatory Visit | Attending: Nurse Practitioner | Admitting: Nurse Practitioner

## 2023-11-23 DIAGNOSIS — R928 Other abnormal and inconclusive findings on diagnostic imaging of breast: Secondary | ICD-10-CM

## 2023-11-24 ENCOUNTER — Ambulatory Visit: Payer: Self-pay | Admitting: Nurse Practitioner

## 2023-11-24 NOTE — Progress Notes (Signed)
 Contacted via [employer]!! I hope your mine is at ease now, I know those moments are scary.  No cancerous concerns noted on recheck.

## 2023-12-03 NOTE — Patient Instructions (Signed)
 Be Involved in Caring For Your Health:  Taking Medications When medications are taken as directed, they can greatly improve your health. But if they are not taken as prescribed, they may not work. In some cases, not taking them correctly can be harmful. To help ensure your treatment remains effective and safe, understand your medications and how to take them. Bring your medications to each visit for review by your provider.  Your lab results, notes, and after visit summary will be available on My Chart. We strongly encourage you to use this feature. If lab results are abnormal the clinic will contact you with the appropriate steps. If the clinic does not contact you assume the results are satisfactory. You can always view your results on My Chart. If you have questions regarding your health or results, please contact the clinic during office hours. You can also ask questions on My Chart.  We at [facility] are grateful that you chose us  to provide your care. We strive to provide evidence-based and compassionate care and are always looking for feedback. If you get a survey from the clinic please complete this so we can hear your opinions.  Healthy Eating, Adult Healthy eating may help you get and keep a healthy body weight, reduce the risk of chronic disease, and live a long and productive life. It is important to follow a healthy eating pattern. Your nutritional and calorie needs should be met mainly by different nutrient-rich foods. What are tips for following this plan? Reading food labels Read labels and choose the following: Reduced or low sodium products. Juices with 100% fruit juice. Foods with low saturated fats (<3 g per serving) and high polyunsaturated and monounsaturated fats. Foods with whole grains, such as whole wheat, cracked wheat, brown rice, and wild rice. Whole grains that are fortified with folic acid. This is recommended for females who are pregnant or who want to  become pregnant. Read labels and do not eat or drink the following: Foods or drinks with added sugars. These include foods that contain brown sugar, corn sweetener, corn syrup, dextrose , fructose, glucose, high-fructose corn syrup, honey, invert sugar, lactose, malt syrup, maltose, molasses, raw sugar, sucrose, trehalose, or turbinado sugar. Limit your intake of added sugars to less than 10% of your total daily calories. Do not eat more than the following amounts of added sugar per day: 6 teaspoons (25 g) for females. 9 teaspoons (38 g) for males. Foods that contain processed or refined starches and grains. Refined grain products, such as white flour, degermed cornmeal, white bread, and white rice. Shopping Choose nutrient-rich snacks, such as vegetables, whole fruits, and nuts. Avoid high-calorie and high-sugar snacks, such as potato chips, fruit snacks, and candy. Use oil-based dressings and spreads on foods instead of solid fats such as butter, margarine, sour cream, or cream cheese. Limit pre-made sauces, mixes, and instant products such as flavored rice, instant noodles, and ready-made pasta. Try more plant-protein sources, such as tofu, tempeh, black beans, edamame, lentils, nuts, and seeds. Explore eating plans such as the Mediterranean diet or vegetarian diet. Try heart-healthy dips made with beans and healthy fats like hummus and guacamole. Vegetables go great with these. Cooking Use oil to saut or stir-fry foods instead of solid fats such as butter, margarine, or lard. Try baking, boiling, grilling, or broiling instead of frying. Remove the fatty part of meats before cooking. Steam vegetables in water  or broth. Meal planning  At meals, imagine dividing your plate into fourths: One-half of  your plate is fruits and vegetables. One-fourth of your plate is whole grains. One-fourth of your plate is protein, especially lean meats, poultry, eggs, tofu, beans, or nuts. Include low-fat  dairy as part of your daily diet. Lifestyle Choose healthy options in all settings, including home, work, school, restaurants, or stores. Prepare your food safely: Wash your hands after handling raw meats. Where you prepare food, keep surfaces clean by regularly washing with hot, soapy water . Keep raw meats separate from ready-to-eat foods, such as fruits and vegetables. Cook seafood, meat, poultry, and eggs to the recommended temperature. Get a food thermometer. Store foods at safe temperatures. In general: Keep cold foods at 84F (4.4C) or below. Keep hot foods at 184F (60C) or above. Keep your freezer at [facility] (-17.8C) or below. Foods are not safe to eat if they have been between the temperatures of 40-184F (4.4-60C) for more than 2 hours. What foods should I eat? Fruits Aim to eat 1-2 cups of fresh, canned (in natural juice), or frozen fruits each day. One cup of fruit equals 1 small apple, 1 large banana, 8 large strawberries, 1 cup (237 g) canned fruit,  cup (82 g) dried fruit, or 1 cup (240 mL) 100% juice. Vegetables Aim to eat 2-4 cups of fresh and frozen vegetables each day, including different varieties and colors. One cup of vegetables equals 1 cup (91 g) broccoli or cauliflower florets, 2 medium carrots, 2 cups (150 g) raw, leafy greens, 1 large tomato, 1 large bell pepper, 1 large sweet potato, or 1 medium white potato. Grains Aim to eat 5-10 ounce-equivalents of whole grains each day. Examples of 1 ounce-equivalent of grains include 1 slice of bread, 1 cup (40 g) ready-to-eat cereal, 3 cups (24 g) popcorn, or  cup (93 g) cooked rice. Meats and other proteins Try to eat 5-7 ounce-equivalents of protein each day. Examples of 1 ounce-equivalent of protein include 1 egg,  oz nuts (12 almonds, 24 pistachios, or 7 walnut halves), 1/4 cup (90 g) cooked beans, 6 tablespoons (90 g) hummus or 1 tablespoon (16 g) peanut butter. A cut of meat or fish that is the size of a deck of  cards is about 3-4 ounce-equivalents (85 g). Of the protein you eat each week, try to have at least 8 sounce (227 g) of seafood. This is about 2 servings per week. This includes salmon, trout, herring, sardines, and anchovies. Dairy Aim to eat 3 cup-equivalents of fat-free or low-fat dairy each day. Examples of 1 cup-equivalent of dairy include 1 cup (240 mL) milk, 8 ounces (250 g) yogurt, 1 ounces (44 g) natural cheese, or 1 cup (240 mL) fortified soy milk. Fats and oils Aim for about 5 teaspoons (21 g) of fats and oils per day. Choose monounsaturated fats, such as canola and olive oils, mayonnaise made with olive oil or avocado oil, avocados, peanut butter, and most nuts, or polyunsaturated fats, such as sunflower, corn, and soybean oils, walnuts, pine nuts, sesame seeds, sunflower seeds, and flaxseed. Beverages Aim for 6 eight-ounce glasses of water  per day. Limit coffee to 3-5 eight-ounce cups per day. Limit caffeinated beverages that have added calories, such as soda and energy drinks. If you drink alcohol: Limit how much you have to: 0-1 drink a day if you are female. 0-2 drinks a day if you are female. Know how much alcohol is in your drink. In the [place] one drink is one 12 oz bottle of beer (355 mL), one 5 oz glass of wine (  148 mL), or one 1 oz glass of hard liquor (44 mL). Seasoning and other foods Try not to add too much salt to your food. Try using herbs and spices instead of salt. Try not to add sugar to food. This information is based on [place] nutrition guidelines. To learn more, visit [employer]posableN[employer].be. Exact amounts may vary. You may need different amounts. This information is not intended to replace advice given to you by your health care provider. Make sure you discuss any questions you have with your health care provider. Document Revised: 11/01/2021 Document Reviewed: 11/01/2021 Elsevier Patient Education  2024 [employer].

## 2023-12-04 ENCOUNTER — Encounter: Payer: Self-pay | Admitting: Nurse Practitioner

## 2023-12-04 ENCOUNTER — Ambulatory Visit (INDEPENDENT_AMBULATORY_CARE_PROVIDER_SITE_OTHER): Admitting: Nurse Practitioner

## 2023-12-04 VITALS — BP 118/72 | HR 114 | Temp 98.4°F | Resp 16 | Ht 63.5 in | Wt 190.8 lb

## 2023-12-04 DIAGNOSIS — I10 Essential (primary) hypertension: Secondary | ICD-10-CM | POA: Diagnosis not present

## 2023-12-04 DIAGNOSIS — E6609 Other obesity due to excess calories: Secondary | ICD-10-CM | POA: Diagnosis not present

## 2023-12-04 DIAGNOSIS — R011 Cardiac murmur, unspecified: Secondary | ICD-10-CM

## 2023-12-04 DIAGNOSIS — Z6832 Body mass index (BMI) 32.0-32.9, adult: Secondary | ICD-10-CM | POA: Diagnosis not present

## 2023-12-04 DIAGNOSIS — E66811 Obesity, class 1: Secondary | ICD-10-CM

## 2023-12-04 DIAGNOSIS — D508 Other iron deficiency anemias: Secondary | ICD-10-CM

## 2023-12-04 DIAGNOSIS — M5481 Occipital neuralgia: Secondary | ICD-10-CM

## 2023-12-04 DIAGNOSIS — Z86718 Personal history of other venous thrombosis and embolism: Secondary | ICD-10-CM

## 2023-12-04 DIAGNOSIS — E782 Mixed hyperlipidemia: Secondary | ICD-10-CM

## 2023-12-04 NOTE — Progress Notes (Signed)
 BP 118/72 (BP Location: Left Arm, Patient Position: Sitting, Cuff Size: Normal)   Pulse (!) 114   Temp 98.4 F (36.9 C) (Oral)   Resp 16   Ht 5' 3.5 (1.613 m)   Wt 190 lb 12.8 oz (86.5 kg)   SpO2 98%   BMI 33.26 kg/m    Subjective:    Patient ID: Jasmine [name], female    DOB: [birth year], 63 y.o.   MRN: [id number]  HPI: Jasmine [name] is a 63 y.o. female  Chief Complaint  Patient presents with   HTN/HLD    Random home checks 130/80 but depends on her activity levels.    Anemia    Always feeling tired. Vitamin B12 daily. Iron  only on days she is not working.    Occipital Neuralgia    No currents issues right now.    HYPERTENSION / HYPERLIPIDEMIA No blood pressure medications.  Continues on Xarelto .  Is early retiring in December. Satisfied with current treatment? yes Duration of hypertension: chronic BP monitoring frequency: not checking BP range:  Duration of hyperlipidemia: chronic Aspirin: no Recent stressors: no Recurrent headaches: no Visual changes: no Palpitations: no Dyspnea: no Chest pain: no Lower extremity edema: no Dizzy/lightheaded: no  The 10-year ASCVD risk score (Arnett DK, et al., 2019) is: 3.6%   Values used to calculate the score:     Age: 2 years     Clincally relevant sex: Female     Is Non-Hispanic African American: No     Diabetic: No     Tobacco smoker: No     Systolic Blood Pressure: 118 mmHg     Is BP treated: No     HDL Cholesterol: 57 mg/dL     Total Cholesterol: 252 mg/dL  ANEMIA Takes A87 on occasion at home. Does not take iron  supplement. Follows with hematology, sees next month. Anemia status: stable Etiology of anemia: unknown Duration of anemia treatment: chronic Compliance with treatment: good compliance Iron  supplementation side effects: constipation Severity of anemia: moderate Fatigue: yes at baseline Decreased exercise tolerance: no  Dyspnea on exertion: no Palpitations: no Bleeding: no Pica: no   OCCIPITAL  NEURALGIA Recently saw neurology last 09/26/23. Was a new provider at [facility]. Prior was seeing [facility].  Has Zofran  and Meclizine .  This has not bothered her in some time. History cerebral venous sinus thrombosis.   Headache status at time of visit: asymptomatic Treatments attempted: none   Aura: no Nausea:  no Vomiting: no Photophobia:  no Phonophobia:  no Confusion:  no Gait disturbance/ataxia:  no Behavioral changes:  no Fevers:  no   Relevant past medical, surgical, family and social history reviewed and updated as indicated. Interim medical history since our last visit reviewed. Allergies and medications reviewed and updated.  Review of Systems  Constitutional:  Negative for activity change, appetite change, diaphoresis, fatigue and fever.  Respiratory:  Negative for cough, chest tightness and shortness of breath.   Cardiovascular:  Negative for chest pain, palpitations and leg swelling.  Gastrointestinal: Negative.   Endocrine: Negative for cold intolerance, heat intolerance, polydipsia, polyphagia and polyuria.  Neurological: Negative.   Psychiatric/Behavioral: Negative.      Per HPI unless specifically indicated above     Objective:    BP 118/72 (BP Location: Left Arm, Patient Position: Sitting, Cuff Size: Normal)   Pulse (!) 114   Temp 98.4 F (36.9 C) (Oral)   Resp 16   Ht 5' 3.5 (1.613 m)   Wt 190 lb 12.8  oz (86.5 kg)   SpO2 98%   BMI 33.26 kg/m   Wt Readings from Last 3 Encounters:  12/04/23 190 lb 12.8 oz (86.5 kg)  07/03/23 183 lb (83 kg)  05/30/23 186 lb 14.4 oz (84.8 kg)    Physical Exam Vitals and nursing note reviewed.  Constitutional:      General: She is awake. She is not in acute distress.    Appearance: She is well-developed and well-groomed. She is obese. She is not ill-appearing or toxic-appearing.  HENT:     Head: Normocephalic.     Right Ear: Hearing and external ear normal.     Left Ear: Hearing and external ear normal.  Eyes:      General: Lids are normal.        Right eye: No discharge.        Left eye: No discharge.     Conjunctiva/sclera: Conjunctivae normal.     Pupils: Pupils are equal, round, and reactive to light.  Neck:     Thyroid : No thyromegaly.     Vascular: No carotid bruit.  Cardiovascular:     Rate and Rhythm: Normal rate and regular rhythm.     Heart sounds: Murmur heard.     Systolic murmur is present with a grade of 2/6.     No gallop.  Pulmonary:     Effort: Pulmonary effort is normal. No accessory muscle usage or respiratory distress.     Breath sounds: Normal breath sounds.  Abdominal:     General: Bowel sounds are normal. There is no distension.     Palpations: Abdomen is soft.     Tenderness: There is no abdominal tenderness.  Musculoskeletal:     Cervical back: Normal range of motion and neck supple.     Right lower leg: No edema.     Left lower leg: No edema.  Lymphadenopathy:     Cervical: No cervical adenopathy.  Skin:    General: Skin is warm and dry.  Neurological:     Mental Status: She is alert and oriented to person, place, and time.     Cranial Nerves: Cranial nerves 2-12 are intact.     Motor: Motor function is intact.     Coordination: Coordination is intact.     Gait: Gait is intact.     Deep Tendon Reflexes: Reflexes are normal and symmetric.     Reflex Scores:      Brachioradialis reflexes are 2+ on the right side and 2+ on the left side.      Patellar reflexes are 2+ on the right side and 2+ on the left side. Psychiatric:        Attention and Perception: Attention normal.        Mood and Affect: Mood normal.        Speech: Speech normal.        Behavior: Behavior normal. Behavior is cooperative.        Thought Content: Thought content normal.     Results for orders placed or performed in visit on 06/26/23  Erythropoietin    Collection Time: 06/26/23  9:50 AM  Result Value Ref Range   Erythropoietin  9.0 2.6 - 18.5 mIU/mL  JAK2 genotypr   Collection Time:  06/26/23  9:50 AM  Result Value Ref Range   JAK2 GenotypR Comment    Director Review, JAK2 Comment    BACKGROUND: Comment   Iron  and TIBC   Collection Time: 06/26/23  9:50 AM  Result Value Ref  Range   Iron  81 28 - 170 ug/dL   TIBC 631 749 - 549 ug/dL   Saturation Ratios 22 10.4 - 31.8 %   UIBC 287 ug/dL  Ferritin   Collection Time: 06/26/23  9:50 AM  Result Value Ref Range   Ferritin 76 11 - 307 ng/mL  CBC   Collection Time: 06/26/23  9:50 AM  Result Value Ref Range   WBC 7.5 4.0 - 10.5 K/uL   RBC 4.80 3.87 - 5.11 MIL/uL   Hemoglobin 15.2 (H) 12.0 - 15.0 g/dL   HCT 54.1 63.9 - 53.9 %   MCV 95.4 80.0 - 100.0 fL   MCH 31.7 26.0 - 34.0 pg   MCHC 33.2 30.0 - 36.0 g/dL   RDW 87.3 88.4 - 84.4 %   Platelets 232 150 - 400 K/uL   nRBC 0.0 0.0 - 0.2 %      Assessment & Plan:   Problem List Items Addressed This Visit       Cardiovascular and Mediastinum   Essential hypertension - Primary   Chronic, stable with BP at goal.  Suspect some white coat hypertension is present.  Recommend she monitor BP at least a few mornings a week at home and document.  DASH diet at home.  Labs today: up to date.  Return in 6 months.         Nervous and Auditory   Occipital neuralgia of right side   Chronic, ongoing.  Established with [facility] neurology recently, previously saw [facility] but wanted to be closer to home.  She will reach out to them to schedule follow-up and ask to transfer to Dr. Maree.        Other   Obesity   BMI 33.26.  Recommended eating smaller high protein, low fat meals more frequently and exercising 30 mins a day 5 times a week with a goal of 10-15lb weight loss in the next 3 months. Patient voiced their understanding and motivation to adhere to these recommendations.       Mixed hyperlipidemia   Chronic, ongoing.  Will continue focus on diet and exercise at this time.  Plan on repeat labs at physical. The 10-year ASCVD risk score (Arnett DK, et al., 2019) is: 3.6%    Values used to calculate the score:     Age: 37 years     Clincally relevant sex: Female     Is Non-Hispanic African American: No     Diabetic: No     Tobacco smoker: No     Systolic Blood Pressure: 118 mmHg     Is BP treated: No     HDL Cholesterol: 57 mg/dL     Total Cholesterol: 252 mg/dL       Iron  deficiency anemia   Chronic, stable. Follows with hematology.  Will continue this collaboration.  Recent note and labs reviewed.  Iron  supplement causes constipation.      History of cerebral venous sinus thrombosis   Stable.  Continue Xarelto .  Established with [facility] neurology recently, previously saw [facility] but wanted to be closer to home.  She will reach out to them to schedule follow-up and ask to transfer to Dr. Maree.      Heart murmur, systolic   Noted on exams.  Systolic.  No current symptoms.  May benefit echo in future.        Follow up plan: Return in about 6 months (around 06/03/2024) for Annual Physical after 05/29/24.

## 2023-12-04 NOTE — Assessment & Plan Note (Signed)
 Chronic, ongoing.  Will continue focus on diet and exercise at this time.  Plan on repeat labs at physical. The 10-year ASCVD risk score (Arnett DK, et al., 2019) is: 3.6%   Values used to calculate the score:     Age: 63 years     Clincally relevant sex: Female     Is Non-Hispanic African American: No     Diabetic: No     Tobacco smoker: No     Systolic Blood Pressure: 118 mmHg     Is BP treated: No     HDL Cholesterol: 57 mg/dL     Total Cholesterol: 252 mg/dL

## 2023-12-04 NOTE — Assessment & Plan Note (Signed)
 Noted on exams.  Systolic.  No current symptoms.  May benefit echo in future.

## 2023-12-04 NOTE — Assessment & Plan Note (Addendum)
 Stable.  Continue Xarelto .  Established with [facility] neurology recently, previously saw [facility] but wanted to be closer to home.  She will reach out to them to schedule follow-up and ask to transfer to Dr. Maree.

## 2023-12-04 NOTE — Assessment & Plan Note (Signed)
 Chronic, ongoing.  Established with [facility] neurology recently, previously saw [facility] but wanted to be closer to home.  She will reach out to them to schedule follow-up and ask to transfer to Dr. Maree.

## 2023-12-04 NOTE — Assessment & Plan Note (Signed)
 Chronic, stable. Follows with hematology.  Will continue this collaboration.  Recent note and labs reviewed.  Iron  supplement causes constipation.

## 2023-12-04 NOTE — Assessment & Plan Note (Signed)
BMI 33.26.  Recommended eating smaller high protein, low fat meals more frequently and exercising 30 mins a day 5 times a week with a goal of 10-15lb weight loss in the next 3 months. Patient voiced their understanding and motivation to adhere to these recommendations.

## 2023-12-04 NOTE — Assessment & Plan Note (Signed)
 Chronic, stable with BP at goal.  Suspect some white coat hypertension is present.  Recommend she monitor BP at least a few mornings a week at home and document.  DASH diet at home.  Labs today: up to date.  Return in 6 months.

## 2023-12-05 ENCOUNTER — Ambulatory Visit: Payer: Self-pay | Admitting: Nurse Practitioner

## 2023-12-05 LAB — COMPREHENSIVE METABOLIC PANEL WITH GFR
ALT: 11 IU/L (ref 0–32)
AST: 15 IU/L (ref 0–40)
Albumin: 4.2 g/dL (ref 3.9–4.9)
Alkaline Phosphatase: 99 IU/L (ref 49–135)
BUN/Creatinine Ratio: 12 (ref 12–28)
BUN: 9 mg/dL (ref 8–27)
Bilirubin Total: 0.5 mg/dL (ref 0.0–1.2)
CO2: 23 mmol/L (ref 20–29)
Calcium: 9.4 mg/dL (ref 8.7–10.3)
Chloride: 100 mmol/L (ref 96–106)
Creatinine, Ser: 0.76 mg/dL (ref 0.57–1.00)
Globulin, Total: 3.1 g/dL (ref 1.5–4.5)
Glucose: 79 mg/dL (ref 70–99)
Potassium: 3.9 mmol/L (ref 3.5–5.2)
Sodium: 140 mmol/L (ref 134–144)
Total Protein: 7.3 g/dL (ref 6.0–8.5)
eGFR: 89 mL/min/1.73 (ref >59)

## 2023-12-05 LAB — LIPID PANEL W/O CHOL/HDL RATIO
Cholesterol, Total: 257 mg/dL — ABNORMAL HIGH (ref 100–199)
HDL: 75 mg/dL (ref >39)
LDL Chol Calc (NIH): 163 mg/dL — ABNORMAL HIGH (ref 0–99)
Triglycerides: 108 mg/dL (ref 0–149)
VLDL Cholesterol Cal: 19 mg/dL (ref 5–40)

## 2023-12-05 NOTE — Progress Notes (Signed)
 Contacted via MyChart The 10-year ASCVD risk score (Arnett DK, et al., 2019) is: 3.1%   Values used to calculate the score:     Age: 63 years     Clincally relevant sex: Female     Is Non-Hispanic African American: No     Diabetic: No     Tobacco smoker: No     Systolic Blood Pressure: 118 mmHg     Is BP treated: No     HDL Cholesterol: 75 mg/dL     Total Cholesterol: 257 mg/dL  Good day Jasmine [name], your labs have returned: - Kidney function, creatinine and eGFR, remains normal, as is liver function, AST and ALT.  - Lipid panel is continuing to show elevations. At this time continue your current focus on diet and exercise.  Any questions? Keep being stellar!!  Thank you for allowing me to participate in your care.  I appreciate you. Kindest regards, Harlowe Dowler

## 2023-12-25 ENCOUNTER — Inpatient Hospital Stay: Payer: Medicaid Other | Admitting: Oncology

## 2023-12-25 ENCOUNTER — Encounter: Payer: Self-pay | Admitting: Oncology

## 2023-12-25 ENCOUNTER — Inpatient Hospital Stay: Payer: Medicaid Other | Attending: Oncology

## 2023-12-25 VITALS — BP 177/84 | HR 61 | Temp 96.9°F | Resp 18 | Ht 63.5 in | Wt 189.1 lb

## 2023-12-25 DIAGNOSIS — Z8639 Personal history of other endocrine, nutritional and metabolic disease: Secondary | ICD-10-CM

## 2023-12-25 DIAGNOSIS — D751 Secondary polycythemia: Secondary | ICD-10-CM | POA: Diagnosis not present

## 2023-12-25 DIAGNOSIS — G4700 Insomnia, unspecified: Secondary | ICD-10-CM | POA: Insufficient documentation

## 2023-12-25 DIAGNOSIS — D509 Iron deficiency anemia, unspecified: Secondary | ICD-10-CM

## 2023-12-25 DIAGNOSIS — Z803 Family history of malignant neoplasm of breast: Secondary | ICD-10-CM | POA: Insufficient documentation

## 2023-12-25 DIAGNOSIS — Z862 Personal history of diseases of the blood and blood-forming organs and certain disorders involving the immune mechanism: Secondary | ICD-10-CM | POA: Insufficient documentation

## 2023-12-25 DIAGNOSIS — Z7982 Long term (current) use of aspirin: Secondary | ICD-10-CM | POA: Diagnosis not present

## 2023-12-25 DIAGNOSIS — Z7901 Long term (current) use of anticoagulants: Secondary | ICD-10-CM | POA: Insufficient documentation

## 2023-12-25 DIAGNOSIS — Z808 Family history of malignant neoplasm of other organs or systems: Secondary | ICD-10-CM | POA: Insufficient documentation

## 2023-12-25 DIAGNOSIS — Z86718 Personal history of other venous thrombosis and embolism: Secondary | ICD-10-CM | POA: Insufficient documentation

## 2023-12-25 LAB — FERRITIN: Ferritin: 84 ng/mL (ref 11–307)

## 2023-12-25 LAB — IRON AND TIBC
Iron: 96 ug/dL (ref 28–170)
Saturation Ratios: 24 % (ref 10.4–31.8)
TIBC: 407 ug/dL (ref 250–450)
UIBC: 311 ug/dL

## 2023-12-25 LAB — CBC
HCT: 47 % — ABNORMAL HIGH (ref 36.0–46.0)
Hemoglobin: 15.7 g/dL — ABNORMAL HIGH (ref 12.0–15.0)
MCH: 32.3 pg (ref 26.0–34.0)
MCHC: 33.4 g/dL (ref 30.0–36.0)
MCV: 96.7 fL (ref 80.0–100.0)
Platelets: 235 K/uL (ref 150–400)
RBC: 4.86 MIL/uL (ref 3.87–5.11)
RDW: 12.8 % (ref 11.5–15.5)
WBC: 8 K/uL (ref 4.0–10.5)
nRBC: 0 % (ref 0.0–0.2)

## 2023-12-25 NOTE — Progress Notes (Signed)
 Patient states she is worried about her blood thinner since she could possibly be losing medicaid.

## 2023-12-25 NOTE — Progress Notes (Signed)
 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  She was started on Coumadin at that time.  She had hypercoagulable work-up done including prothrombin gene mutation, protein C, protein S activity, factor V activity and Antithrombin III levels which were unremarkable.  She also had work-up for antiphospholipid antibody syndrome which was negative.  No apparent cause for her dural venous sinus thrombosis was found at that time.  She continues to follow-up with neurology and was seen by them in 2016.  At that time her Coumadin was stopped as repeat MRI showed significant improvement in these findings and patient has not had any current strokes.  She was switched to low-dose aspirin.  She then presented with a second episode of dural venous sinus thrombosis in 2018.  At that time she was started on rivaraoxaban and she continues to be on that.  CBC in September 2018 showed a white count of 14.9, hemoglobin interval 9/30.4 with an MCV of 71.2 and a platelet count of 326 which was lower as compared to her baseline hemoglobin of 14 in 2013.   She now presents to the hospital with right upper quadrant abdominal pain and she was found to have New onset anemia with a hemoglobin of 6.5/25.3 with an MCV of 68.9 and was hence admitted.  B12  levels were low at 100.  Ferritin levels were low at 2 and iron  studies showed elevated TIBC of 580 with a low iron  saturation of 7%.  Folate levels were normal.   egd colonoscopy and capsule study were unremarkable except for large hiatal hernia which was likely the cause of her iron  deficiency anemia. She underwent  Nissen fundoplication in July 2020  Interval history- Discussed the use of AI scribe software for clinical note transcription with the patient, who gave verbal consent to proceed. Jasmine [name] is a 63 year old female with cerebral venous thrombosis who presents for follow-up.  She continues to take Xarelto  for cerebral venous thrombosis without experiencing bleeding or thrombotic events. Her hemoglobin levels have remained consistently high, with the most recent reading at 15.7. Testing for JAK2 mutation six months ago was negative.  She experiences chronic insomnia, characterized by an overactive mind, making lists and thinking about upcoming tasks, which affects her sleep quality. She has not been tested for sleep apnea.  She is currently on Medicaid and is concerned about losing coverage due to early retirement and potential income changes. She is exploring options for health insurance coverage, including the possibility of using a health savings account (HSA).     ECOG PS- 1 Pain scale- 0   Review of systems- Review of Systems  Constitutional:  Negative for chills, fever, malaise/fatigue and weight loss.  HENT:  Negative for congestion, ear discharge and nosebleeds.   Eyes:  Negative for blurred vision.  Respiratory:  Negative for cough, hemoptysis, sputum production, shortness of breath and wheezing.  Cardiovascular:  Negative for chest pain, palpitations, orthopnea and claudication.  Gastrointestinal:  Negative for abdominal pain, blood in stool, constipation, diarrhea, heartburn, melena, nausea and vomiting.  Genitourinary:  Negative for dysuria, flank pain, frequency,  hematuria and urgency.  Musculoskeletal:  Negative for back pain, joint pain and myalgias.  Skin:  Negative for rash.  Neurological:  Negative for dizziness, tingling, focal weakness, seizures, weakness and headaches.  Endo/Heme/Allergies:  Does not bruise/bleed easily.  Psychiatric/Behavioral:  Negative for depression and suicidal ideas. The patient does not have insomnia.       Allergies  Allergen Reactions   Food Other (See Comments)    Pumpkins/squash/zucchini (gourds foods) swelling of the face   Penicillins Hives   Shellfish Allergy Other (See Comments), Rash and Dermatitis    Crawling skin/stomach cramps.  Pumpkins/squash/zucchini (gourds foods) swelling of the face   Hydrochlorothiazide Itching   Tape Itching, Dermatitis, Rash and Other (See Comments)    Transpore tape Transpore tape   Amlodipine      Cramps and diarrhea   Povidone Iodine    Codeine Other (See Comments)    Crawling skin     Past Medical History:  Diagnosis Date   Allergy    Anemia    Anxiety    Arthritis    LEFT SHOULDER   Depression    H/O cerebral venous sinus thrombosis 2013   History of hiatal hernia    History of kidney stones    Occipital neuralgia of right side      Past Surgical History:  Procedure Laterality Date   COLONOSCOPY WITH PROPOFOL  N/A 07/02/2018   Procedure: COLONOSCOPY WITH PROPOFOL ;  Surgeon: Janalyn Keene NOVAK, MD;  Location: [facility] ENDOSCOPY;  Service: Endoscopy;  Laterality: N/A;   ENTEROSCOPY N/A 07/17/2018   Procedure: ENTEROSCOPY;  Surgeon: Therisa Bi, MD;  Location: [facility] ENDOSCOPY;  Service: Gastroenterology;  Laterality: N/A;   ESOPHAGOGASTRODUODENOSCOPY N/A 2001   approximately- for Reflux   ESOPHAGOGASTRODUODENOSCOPY (EGD) WITH PROPOFOL  N/A 07/02/2018   Procedure: ESOPHAGOGASTRODUODENOSCOPY (EGD) WITH PROPOFOL ;  Surgeon: Janalyn Keene NOVAK, MD;  Location: [facility] ENDOSCOPY;  Service: Endoscopy;  Laterality: N/A;   ESOPHAGOGASTRODUODENOSCOPY (EGD) WITH  PROPOFOL  N/A 07/11/2018   Procedure: ESOPHAGOGASTRODUODENOSCOPY (EGD) WITH PROPOFOL ;  Surgeon: Legrand Victory LITTIE DOUGLAS, MD;  Location: [facility] ENDOSCOPY;  Service: Gastroenterology;  Laterality: N/A;  PT TO HAVE CAPSULE PLACEMENT   GIVENS CAPSULE STUDY N/A 07/11/2018   Procedure: GIVENS CAPSULE STUDY;  Surgeon: Legrand Victory LITTIE DOUGLAS, MD;  Location: [facility] ENDOSCOPY;  Service: Gastroenterology;  Laterality: N/A;   HERNIA REPAIR  August 2020   HIATAL HERNIA REPAIR  08/23/2018   XI ROBOTIC ASSISTED HIATAL HERNIA REPAIR  WITH FUNDOPLICATION    Social History   Socioeconomic History   Marital status: Single    Spouse name: Not on file   Number of children: Not on file   Years of education: Not on file   Highest education level: Some college, no degree  Occupational History   Not on file  Tobacco Use   Smoking status: Never   Smokeless tobacco: Never  Vaping Use   Vaping status: Never Used  Substance and Sexual Activity   Alcohol use: Yes    Comment: occasional   Drug use: Yes    Types: Marijuana    Comment: occasional   Sexual activity: Not Currently  Other Topics Concern   Not on file  Social History Narrative   Lives at home with her parents.  Independent at baseline.   Social Drivers of Health  Financial Resource Strain: Medium Risk (11/30/2023)   Overall Financial Resource Strain (CARDIA)    Difficulty of Paying Living Expenses: Somewhat hard  Food Insecurity: Food Insecurity Present (11/30/2023)   Hunger Vital Sign    Worried About Running Out of Food in the Last Year: Sometimes true    Ran Out of Food in the Last Year: Sometimes true  Transportation Needs: Unmet Transportation Needs (11/30/2023)   PRAPARE - Administrator, Civil Service (Medical): No    Lack of Transportation (Non-Medical): Yes  Physical Activity: Sufficiently Active (11/30/2023)   Exercise Vital Sign    Days of Exercise per Week: 3 days    Minutes of Exercise per Session: 150+ min  Stress: Stress  Concern Present (11/30/2023)   [employer] of Occupational Health - Occupational Stress Questionnaire    Feeling of Stress: Very much  Social Connections: Socially Isolated (11/30/2023)   Social Connection and Isolation Panel    Frequency of Communication with Friends and Family: Once a week    Frequency of Social Gatherings with Friends and Family: Never    Attends Religious Services: Patient declined    Database Administrator or Organizations: No    Attends Engineer, Structural: Not on file    Marital Status: Never married  Intimate Partner Violence: Not At Risk (03/24/2023)   Humiliation, Afraid, Rape, and Kick questionnaire    Fear of Current or Ex-Partner: No    Emotionally Abused: No    Physically Abused: No    Sexually Abused: No    Family History  Problem Relation Age of Onset   Breast cancer Mother 58   Bone cancer Mother    Cancer Mother    Obesity Father    Cancer Paternal Grandmother    Alcohol abuse Paternal Grandfather    Diabetes Brother    CAD Brother 55       CABG x 4     Current Outpatient Medications:    acetaminophen  (TYLENOL ) 500 MG tablet, Take 500 mg by mouth as needed., Disp: , Rfl:    meclizine  (ANTIVERT ) 25 MG tablet, Take 0.5 tablets (12.5 mg total) by mouth 3 (three) times daily as needed for dizziness., Disp: 15 tablet, Rfl: 0   ondansetron  (ZOFRAN -ODT) 4 MG disintegrating tablet, Take 1 tablet (4 mg total) by mouth every 8 (eight) hours as needed for nausea or vomiting., Disp: 12 tablet, Rfl: 0   vitamin B-12 (CYANOCOBALAMIN ) 500 MCG tablet, Take 500 mcg by mouth daily., Disp: , Rfl:    XARELTO  20 MG TABS tablet, TAKE 1 TABLET BY MOUTH AT BEDTIME, Disp: 90 tablet, Rfl: 2  Physical exam:  Vitals:   12/25/23 1022 12/25/23 1027  BP: (!) 191/107 (!) 177/84  Pulse: 61   Resp: 18   Temp: (!) 96.9 F (36.1 C)   TempSrc: Tympanic   SpO2: 100%   Weight: 189 lb 1.6 oz (85.8 kg)   Height: 5' 3.5 (1.613 m)    Physical  Exam Cardiovascular:     Rate and Rhythm: Normal rate and regular rhythm.     Heart sounds: Normal heart sounds.  Pulmonary:     Effort: Pulmonary effort is normal.     Breath sounds: Normal breath sounds.  Skin:    General: Skin is warm and dry.  Neurological:     Mental Status: She is alert and oriented to person, place, and time.      I have personally reviewed labs listed below:  Latest Ref Rng & Units 12/04/2023    1:40 PM  CMP  Glucose 70 - 99 mg/dL 79   BUN 8 - 27 mg/dL 9   Creatinine 9.42 - 8.99 mg/dL 9.23   Sodium 865 - 855 mmol/L 140   Potassium 3.5 - 5.2 mmol/L 3.9   Chloride 96 - 106 mmol/L 100   CO2 20 - 29 mmol/L 23   Calcium  8.7 - 10.3 mg/dL 9.4   Total Protein 6.0 - 8.5 g/dL 7.3   Total Bilirubin 0.0 - 1.2 mg/dL 0.5   Alkaline Phos 49 - 135 IU/L 99   AST 0 - 40 IU/L 15   ALT 0 - 32 IU/L 11       Latest Ref Rng & Units 12/25/2023    9:51 AM  CBC  WBC 4.0 - 10.5 K/uL 8.0   Hemoglobin 12.0 - 15.0 g/dL 84.2   Hematocrit 63.9 - 46.0 % 47.0   Platelets 150 - 400 K/uL 235       Assessment and plan- Patient is a 63 y.o. female here for a routine follow-up of the following issues:  Assessment and Plan    Cerebral venous thrombosis on anticoagulation Cerebral venous thrombosis well-managed with Xarelto . No bleeding or thrombotic events. Discussed potential Medicaid coverage loss affecting Xarelto  access. - Continue Xarelto . - Discuss options with social services or insurance agents for medication access if Medicaid coverage is lost.  Elevated hemoglobin Hemoglobin stable at 15.7, no bone marrow issues or JAK2 mutation.  Phlebotomy not indicated unless hematocrit greater than 55.  Consider workup for obstructive sleep apnea by PCP. - Continue monitoring hemoglobin levels.     History of iron  deficiency: Iron  studies currently are pending  I will see on a yearly basis with labs    Visit Diagnosis 1. History of iron  deficiency   2. Current use of  long term anticoagulation   3. Polycythemia, secondary      Dr. Annah Skene, MD, MPH [facility] [phone] 12/25/2023 12:58 PM

## 2023-12-27 ENCOUNTER — Ambulatory Visit: Payer: Medicaid Other | Admitting: Oncology

## 2023-12-27 ENCOUNTER — Other Ambulatory Visit: Payer: Medicaid Other

## 2023-12-27 DIAGNOSIS — Z419 Encounter for procedure for purposes other than remedying health state, unspecified: Secondary | ICD-10-CM | POA: Diagnosis not present

## 2024-01-11 DIAGNOSIS — I10 Essential (primary) hypertension: Secondary | ICD-10-CM | POA: Diagnosis not present

## 2024-01-11 DIAGNOSIS — I160 Hypertensive urgency: Secondary | ICD-10-CM | POA: Diagnosis not present

## 2024-01-15 ENCOUNTER — Encounter: Payer: Self-pay | Admitting: Nurse Practitioner

## 2024-01-17 ENCOUNTER — Ambulatory Visit: Admitting: Nurse Practitioner

## 2024-01-17 ENCOUNTER — Encounter: Payer: Self-pay | Admitting: Nurse Practitioner

## 2024-01-17 VITALS — BP 167/88 | HR 56 | Temp 98.4°F | Resp 15 | Ht 63.5 in | Wt 187.8 lb

## 2024-01-17 DIAGNOSIS — I10 Essential (primary) hypertension: Secondary | ICD-10-CM | POA: Diagnosis not present

## 2024-01-17 DIAGNOSIS — Z86718 Personal history of other venous thrombosis and embolism: Secondary | ICD-10-CM

## 2024-01-17 MED ORDER — LOSARTAN POTASSIUM 25 MG PO TABS: 25.0000 mg | ORAL_TABLET | Freq: Every day | ORAL | 0 refills | Status: DC

## 2024-01-17 NOTE — Patient Instructions (Signed)

## 2024-01-17 NOTE — Progress Notes (Signed)
 BP (!) 167/88 (BP Location: Left Arm, Patient Position: Sitting, Cuff Size: Large)   Pulse (!) 56   Temp 98.4 F (36.9 C) (Oral)   Resp 15   Ht 5' 3.5 (1.613 m)   Wt 187 lb 12.8 oz (85.2 kg)   SpO2 99%   BMI 32.74 kg/m    Subjective:    Patient ID: Jasmine [name], female    DOB: 1960/08/24, 63 y.o.   MRN: [id number]  HPI: Jasmine [name] is a 63 y.o. female  Chief Complaint  Patient presents with   Hypertension    Has been running high. Highest was 167/88 and when she went to UC.    HYPERTENSION without Chronic Kidney Disease Presents for increased BP levels over past week, has had more life stressors. Concerns about Medicaid/losing (she will be $300 over the limit) this and Xarelto  cost. Hypertension status: uncontrolled  Satisfied with current treatment? yes Duration of hypertension: chronic BP monitoring frequency:  daily -- too much BP range: DBP staying 80 to 90 range BP medication side effects:  no Previous BP meds: Amlodipine  -- had allergy to it and HCTZ (itching) Aspirin: no Recurrent headaches: not recurrent, but did have a headache one day Visual changes: no Palpitations: no Dyspnea: no Chest pain: occasional tightness, but no pain Lower extremity edema: no Dizzy/lightheaded: no   Relevant past medical, surgical, family and social history reviewed and updated as indicated. Interim medical history since our last visit reviewed. Allergies and medications reviewed and updated.  Review of Systems  Constitutional:  Negative for activity change, appetite change, diaphoresis, fatigue and fever.  Respiratory:  Negative for cough, chest tightness and shortness of breath.   Cardiovascular:  Negative for chest pain, palpitations and leg swelling.  Gastrointestinal: Negative.   Endocrine: Negative for cold intolerance, heat intolerance, polydipsia, polyphagia and polyuria.  Neurological: Negative.   Psychiatric/Behavioral: Negative.      Per HPI unless  specifically indicated above     Objective:    BP (!) 167/88 (BP Location: Left Arm, Patient Position: Sitting, Cuff Size: Large)   Pulse (!) 56   Temp 98.4 F (36.9 C) (Oral)   Resp 15   Ht 5' 3.5 (1.613 m)   Wt 187 lb 12.8 oz (85.2 kg)   SpO2 99%   BMI 32.74 kg/m   Wt Readings from Last 3 Encounters:  01/17/24 187 lb 12.8 oz (85.2 kg)  12/25/23 189 lb 1.6 oz (85.8 kg)  12/04/23 190 lb 12.8 oz (86.5 kg)    Physical Exam Vitals and nursing note reviewed.  Constitutional:      General: She is awake. She is not in acute distress.    Appearance: She is well-developed and well-groomed. She is obese. She is not ill-appearing or toxic-appearing.  HENT:     Head: Normocephalic.     Right Ear: Hearing and external ear normal.     Left Ear: Hearing and external ear normal.  Eyes:     General: Lids are normal.        Right eye: No discharge.        Left eye: No discharge.     Conjunctiva/sclera: Conjunctivae normal.     Pupils: Pupils are equal, round, and reactive to light.  Neck:     Thyroid : No thyromegaly.     Vascular: No carotid bruit.  Cardiovascular:     Rate and Rhythm: Normal rate and regular rhythm.     Heart sounds: Normal heart sounds. No murmur  heard.    No gallop.  Pulmonary:     Effort: Pulmonary effort is normal. No accessory muscle usage or respiratory distress.     Breath sounds: Normal breath sounds.  Abdominal:     General: Bowel sounds are normal. There is no distension.     Palpations: Abdomen is soft.     Tenderness: There is no abdominal tenderness.  Musculoskeletal:     Cervical back: Normal range of motion and neck supple.     Right lower leg: No edema.     Left lower leg: No edema.  Lymphadenopathy:     Cervical: No cervical adenopathy.  Skin:    General: Skin is warm and dry.  Neurological:     Mental Status: She is alert and oriented to person, place, and time.     Deep Tendon Reflexes: Reflexes are normal and symmetric.     Reflex  Scores:      Brachioradialis reflexes are 2+ on the right side and 2+ on the left side.      Patellar reflexes are 2+ on the right side and 2+ on the left side. Psychiatric:        Attention and Perception: Attention normal.        Mood and Affect: Mood normal.        Speech: Speech normal.        Behavior: Behavior normal. Behavior is cooperative.        Thought Content: Thought content normal.     Results for orders placed or performed in visit on 12/25/23  Iron  and TIBC   Collection Time: 12/25/23  9:51 AM  Result Value Ref Range   Iron  96 28 - 170 ug/dL   TIBC 592 749 - 549 ug/dL   Saturation Ratios 24 10.4 - 31.8 %   UIBC 311 ug/dL  Ferritin   Collection Time: 12/25/23  9:51 AM  Result Value Ref Range   Ferritin 84 11 - 307 ng/mL  CBC   Collection Time: 12/25/23  9:51 AM  Result Value Ref Range   WBC 8.0 4.0 - 10.5 K/uL   RBC 4.86 3.87 - 5.11 MIL/uL   Hemoglobin 15.7 (H) 12.0 - 15.0 g/dL   HCT 52.9 (H) 63.9 - 53.9 %   MCV 96.7 80.0 - 100.0 fL   MCH 32.3 26.0 - 34.0 pg   MCHC 33.4 30.0 - 36.0 g/dL   RDW 87.1 88.4 - 84.4 %   Platelets 235 150 - 400 K/uL   nRBC 0.0 0.0 - 0.2 %      Assessment & Plan:   Problem List Items Addressed This Visit       Cardiovascular and Mediastinum   Essential hypertension - Primary   Chronic and exacerbated by recent stressors. Has taken Amlodipine  and HCTZ in past with side effects. Will trial lose dose Losartan 25 MG daily to start and if tolerating and BP remains >130/80 can increase to 50 MG daily in one week. Educated her on this medication and side effects to monitor for and report. Recommend she monitor BP at least a few mornings a week at home and document.  DASH diet at home.  Labs today: BMP.       Relevant Medications   losartan (COZAAR) 25 MG tablet   Other Relevant Orders   Basic metabolic panel with GFR   AMB Referral [facility]     Other   History of cerebral venous sinus thrombosis   Stable.  Continue  Xarelto .  Will place referral to [facility] to work on assistance needs.      Relevant Orders   AMB Referral [facility] Care Management     Follow up plan: Return in about 4 weeks (around 02/14/2024).

## 2024-01-17 NOTE — Assessment & Plan Note (Signed)
 Chronic and exacerbated by recent stressors. Has taken Amlodipine  and HCTZ in past with side effects. Will trial lose dose Losartan 25 MG daily to start and if tolerating and BP remains >130/80 can increase to 50 MG daily in one week. Educated her on this medication and side effects to monitor for and report. Recommend she monitor BP at least a few mornings a week at home and document.  DASH diet at home.  Labs today: BMP.

## 2024-01-17 NOTE — Assessment & Plan Note (Signed)
 Stable.  Continue Xarelto .  Will place referral to [facility] to work on assistance needs.

## 2024-01-18 LAB — BASIC METABOLIC PANEL WITH GFR
BUN/Creatinine Ratio: 17 (ref 12–28)
BUN: 10 mg/dL (ref 8–27)
CO2: 25 mmol/L (ref 20–29)
Calcium: 9.3 mg/dL (ref 8.7–10.3)
Chloride: 101 mmol/L (ref 96–106)
Creatinine, Ser: 0.6 mg/dL (ref 0.57–1.00)
Glucose: 96 mg/dL (ref 70–99)
Potassium: 4.9 mmol/L (ref 3.5–5.2)
Sodium: 140 mmol/L (ref 134–144)
eGFR: 101 mL/min/1.73 (ref >59)

## 2024-01-19 ENCOUNTER — Ambulatory Visit: Payer: Self-pay | Admitting: Nurse Practitioner

## 2024-01-19 NOTE — Progress Notes (Signed)
 Contacted via MyChart  Good morning Jasmine [name], your labs have returned and are nice and normal. Great news!! Take new blood pressure medication daily.

## 2024-01-22 ENCOUNTER — Other Ambulatory Visit: Payer: Self-pay

## 2024-01-22 VITALS — Wt 187.0 lb

## 2024-01-22 DIAGNOSIS — I10 Essential (primary) hypertension: Secondary | ICD-10-CM

## 2024-01-22 NOTE — Addendum Note (Signed)
 Addended by: NIVIA, Telisha Zawadzki  G on: 01/22/2024 02:00 PM   Modules accepted: Orders

## 2024-01-22 NOTE — Patient Outreach (Signed)
 Complex Care Management   Visit Note  01/22/2024  Name:  Jasmine [name] MRN: [id number] DOB: August 18, 1960  Situation: Referral received for Complex Care Management related to Hypertension I obtained verbal consent from Patient.  Visit completed with Patient  on the phone  Background:   Past Medical History:  Diagnosis Date   Allergy    Anemia    Anxiety    Arthritis    LEFT SHOULDER   Depression    H/O cerebral venous sinus thrombosis 2013   History of hiatal hernia    History of kidney stones    Occipital neuralgia of right side     Assessment: Patient Reported Symptoms:  Cognitive Cognitive Status: No symptoms reported   Health Maintenance Behaviors: Annual physical exam Healing Pattern: Average  Neurological Neurological Review of Symptoms: No symptoms reported Neurological Management Strategies: Routine screening Neurological Self-Management Outcome: 4 (good)  HEENT HEENT Symptoms Reported: No symptoms reported HEENT Management Strategies: Routine screening HEENT Self-Management Outcome: 4 (good)    Cardiovascular Cardiovascular Symptoms Reported: No symptoms reported Does patient have uncontrolled Hypertension?: Yes Patient's Recent BP reading at home: 150/80 bedtime 12/4 Cardiovascular Management Strategies: Routine screening Weight: 187 lb (84.8 kg) Cardiovascular Self-Management Outcome: 4 (good)  Respiratory Respiratory Symptoms Reported: No symptoms reported Respiratory Management Strategies: Adequate rest, Routine screening Respiratory Self-Management Outcome: 4 (good)  Endocrine Endocrine Symptoms Reported: No symptoms reported Is patient diabetic?: No Endocrine Self-Management Outcome: 4 (good)  Gastrointestinal Gastrointestinal Symptoms Reported: No symptoms reported Additional Gastrointestinal Details: frequent constipation Gastrointestinal Management Strategies: Diet modification Gastrointestinal Self-Management Outcome: 4 (good) Gastrointestinal  Comment: increased fiber    Genitourinary Genitourinary Symptoms Reported: No symptoms reported Genitourinary Management Strategies: Adequate rest Genitourinary Self-Management Outcome: 4 (good)  Integumentary Integumentary Symptoms Reported: No symptoms reported Skin Management Strategies: Routine screening Skin Self-Management Outcome: 4 (good)  Musculoskeletal Musculoskelatal Symptoms Reviewed: No symptoms reported Musculoskeletal Management Strategies: Routine screening Musculoskeletal Self-Management Outcome: 4 (good) Falls in the past year?: No Number of falls in past year: 1 or less Was there an injury with Fall?: No Fall Risk Category Calculator: 0 Patient Fall Risk Level: Low Fall Risk Patient at Risk for Falls Due to: No Fall Risks Fall risk Follow up: Falls evaluation completed  Psychosocial Psychosocial Symptoms Reported: No symptoms reported Additional Psychological Details: sadness missing her dog Behavioral Management Strategies: Adequate rest, Activity Behavioral Health Self-Management Outcome: 4 (good) Techniques to Cope with Loss/Stress/Change: None Quality of Family Relationships: supportive Do you feel physically threatened by others?: No    01/22/2024    PHQ2-9 Depression Screening   Little interest or pleasure in doing things Not at all  Feeling down, depressed, or hopeless Not at all  PHQ-2 - Total Score 0  Trouble falling or staying asleep, or sleeping too much Nearly every day  Feeling tired or having little energy Several days  Poor appetite or overeating  Not at all  Feeling bad about yourself - or that you are a failure or have let yourself or your family down Several days  Trouble concentrating on things, such as reading the newspaper or watching television Not at all  Moving or speaking so slowly that other people could have noticed.  Or the opposite - being so fidgety or restless that you have been moving around a lot more than usual Not at all   Thoughts that you would be better off dead, or hurting yourself in some way Not at all  PHQ2-9 Total Score 5  If you checked off  any problems, how difficult have these problems made it for you to do your work, take care of things at home, or get along with other people Somewhat difficult  Depression Interventions/Treatment      Today's Vitals   01/22/24 0953  Weight: 187 lb (84.8 kg)   Pain Scale: 0-10 Pain Score: 0-No pain Multiple Pain Sites: No  Medications Reviewed Today     Reviewed by Nivia Heman Que , RN (Registered Nurse) on 01/22/24 at [phone]  Med List Status: <None>   Medication Order Taking? Sig Documenting Provider Last Dose Status Informant  acetaminophen  (TYLENOL ) 500 MG tablet [id number] Yes Take 500 mg by mouth as needed. [provider]  Active   losartan  (COZAAR ) 25 MG tablet [id number] Yes Take 1 tablet (25 mg total) by mouth daily. May increase in one week to 50 MG (two tablets) daily by mouth if BP remains consistently >130/80. Cannady, Jolene T, NP  Active   meclizine  (ANTIVERT ) 25 MG tablet [id number] Yes Take 0.5 tablets (12.5 mg total) by mouth 3 (three) times daily as needed for dizziness. Melvin Pao, NP  Active   ondansetron  (ZOFRAN -ODT) 4 MG disintegrating tablet [id number] Yes Take 1 tablet (4 mg total) by mouth every 8 (eight) hours as needed for nausea or vomiting. Clarine Ozell LABOR, MD  Active   vitamin B-12 (CYANOCOBALAMIN ) 500 MCG tablet [id number] Yes Take 500 mcg by mouth daily. [provider]  Active   XARELTO  20 MG TABS tablet [id number] Yes TAKE 1 TABLET BY MOUTH AT BEDTIME Melanee Annah BROCKS, MD  Active             Recommendation:   Referral to: LCSW for social barrriers   Follow Up Plan:   Telephone follow-up in 2 weeks 02/05/24  Merl Bommarito RN RN Care Manager [facility] Population Health [phone]

## 2024-01-22 NOTE — Patient Instructions (Signed)
 Visit Information  Ms. Guinta was given information about Medicaid Managed Care team care coordination services as a part of their [facility] Medicaid benefit.   If you would like to schedule transportation through your [facility] plan, please call the following number at least 2 days in advance of your appointment: [phone].   You can also use the MTM portal or MTM mobile app to manage your rides. Reimbursement for transportation is available through [facility]! For the portal, please go to [email or web address]  Call the [facility] Crisis Line at [phone], at any time, 24 hours a day, 7 days a week. If you are in danger or need immediate medical attention call 911.  Please see education materials related to HTN provided by MyChart link.  Patient verbalizes understanding of instructions and care plan provided today and agrees to view in MyChart. Active MyChart status and patient understanding of how to access instructions and care plan via MyChart confirmed with patient.     RN Care Manager will follow up in 2 weeks and make a referral for LCSW for social barrriers and depression  Haley Fuerstenberg RN RN Care Manager [facility] [phone]   Following is a copy of your plan of care:   Goals Addressed   None

## 2024-01-24 ENCOUNTER — Other Ambulatory Visit: Payer: Self-pay

## 2024-01-24 DIAGNOSIS — Z86718 Personal history of other venous thrombosis and embolism: Secondary | ICD-10-CM

## 2024-01-24 NOTE — Progress Notes (Signed)
 01/24/2024 Name: Jasmine [name] MRN: [id number] DOB: September 17, 1960  Chief Complaint  Patient presents with   Medication Assistance   Jasmine [name] is a 63 y.o. year old female who presented for a telephone visit.   They were referred to the pharmacist by their PCP for assistance in managing medication access.   Subjective:  Care Team: Primary Care Provider: Valerio Melanie DASEN, NP ; Next Scheduled Visit: 02/14/2024  Medication Access/Adherence  Current Pharmacy:  [id number] - [place], [place] - [address] [place] [address] Phone: [phone] Fax: [phone]  Patient reports affordability concerns with their medications: Yes  Patient reports access/transportation concerns to their pharmacy: No  Patient reports adherence concerns with their medications:  No    History of Cerebral Venous Sinus Thrombosis: Current medications:  Xarelto  20mg  daily -Patient will be on Xarelto  20mg  indefinitely based on PMH of clots x2 -Patient will begin drawing social security in January but will still work part-time to supplement.  This will likely push her over the income threshold for Medicaid coverage, which she currently has.  She received a letter stating redetermination for Medicaid will be due in April.  She will not qualify for Medicare until she is 48 and will not receive insurance benefits through part-time job.  Objective:  Lab Results  Component Value Date   CREATININE 0.60 01/17/2024   BUN 10 01/17/2024   NA 140 01/17/2024   K 4.9 01/17/2024   CL 101 01/17/2024   CO2 25 01/17/2024   Medications Reviewed Today     Reviewed by Deanna Channing LABOR, RPH (Pharmacist) on 01/24/24 at 1417  Med List Status: <None>   Medication Order Taking? Sig Documenting Provider Last Dose Status Informant  acetaminophen  (TYLENOL ) 500 MG tablet [id number]  Take 500 mg by mouth as needed. [provider]  Active   losartan  (COZAAR ) 25 MG tablet [id number] Yes Take 1 tablet (25  mg total) by mouth daily. May increase in one week to 50 MG (two tablets) daily by mouth if BP remains consistently >130/80. Cannady, Jolene T, NP  Active   meclizine  (ANTIVERT ) 25 MG tablet [id number] Yes Take 0.5 tablets (12.5 mg total) by mouth 3 (three) times daily as needed for dizziness. Melvin Pao, NP  Active   ondansetron  (ZOFRAN -ODT) 4 MG disintegrating tablet [id number]  Take 1 tablet (4 mg total) by mouth every 8 (eight) hours as needed for nausea or vomiting. Clarine Ozell LABOR, MD  Active   vitamin B-12 (CYANOCOBALAMIN ) 500 MCG tablet [id number]  Take 500 mcg by mouth daily. [provider]  Active   XARELTO  20 MG TABS tablet [id number] Yes TAKE 1 TABLET BY MOUTH AT BEDTIME Melanee Annah BROCKS, MD  Active            Assessment/Plan:   History of Cerebral Venous Sinus Thrombosis: -Recommend patient continue Xarelto  20mg  daily, as this has been effective in preventing subsequent blood clots -Patient will continue to refill 3 month supplies of Xarelto  20mg  at a time for so long as she is covered by Medicaid -Based on anticipated monthly and yearly income, she should qualify for [facility] program, which does provide Xarelto  20mg  at no cost to patients living in [facility] without insurance so long as they do not qualify for Medicaid or Medicare.  We cannot apply for this program until Medicaid coverage has ended, though.  Follow Up Plan: 3/25 but patient has my direct phone number in case Medicaid  coverage will end before April  [name], PharmD, DPLA

## 2024-01-30 ENCOUNTER — Other Ambulatory Visit: Payer: Self-pay

## 2024-01-30 NOTE — Patient Outreach (Signed)
 Complex Care Management   Visit Note  01/30/2024  Name:  Jasmine [name] MRN: [id number] DOB: [birth year]  Situation: Referral received for Complex Care Management related to Mental/Behavioral Health diagnosis anxiety and depression. I obtained verbal consent from Patient.  Visit completed with Patient  on the phone. LCSW and patient discussed resources for patient's mental health needs. Patient is amenable to therapy and medications for mental health needs.  Background:   Past Medical History:  Diagnosis Date   Allergy    Anemia    Anxiety    Arthritis    LEFT SHOULDER   Depression    H/O cerebral venous sinus thrombosis 2013   History of hiatal hernia    History of kidney stones    Occipital neuralgia of right side     Assessment: Patient Reported Symptoms:  Cognitive Cognitive Status: Normal speech and language skills, Alert and oriented to person, place, and time Cognitive/Intellectual Conditions Management [RPT]: None reported or documented in medical history or problem list      Neurological Neurological Review of Symptoms: Not assessed    HEENT HEENT Symptoms Reported: Not assessed      Cardiovascular Cardiovascular Symptoms Reported: Not assessed    Respiratory Respiratory Symptoms Reported: Not assesed    Endocrine Endocrine Symptoms Reported: Not assessed    Gastrointestinal Gastrointestinal Symptoms Reported: Not assessed      Genitourinary Genitourinary Symptoms Reported: Not assessed    Integumentary Integumentary Symptoms Reported: Not assessed    Musculoskeletal Musculoskelatal Symptoms Reviewed: Not assessed        Psychosocial Psychosocial Symptoms Reported: Anxiety - if selected complete GAD, Depression - if selected complete PHQ 2-9 (Patient completed assessment a week ago) Additional Psychological Details: Patient reports feeling sad about the loss of her dog Behavioral Management Strategies: Activity, Adequate rest, Exercise, Coping strategies  (patient is wanting therapy) Major Change/Loss/Stressor/Fears (CP): Death of a loved one, Traumatic event (Loss of a pet) Behaviors When Feeling Stressed/Fearful: Patient reports shaking and restless Techniques to Cope with Loss/Stress/Change: Exercise Quality of Family Relationships: supportive Do you feel physically threatened by others?: No    01/30/2024    PHQ2-9 Depression Screening   Little interest or pleasure in doing things    Feeling down, depressed, or hopeless    PHQ-2 - Total Score    Trouble falling or staying asleep, or sleeping too much    Feeling tired or having little energy    Poor appetite or overeating     Feeling bad about yourself - or that you are a failure or have let yourself or your family down    Trouble concentrating on things, such as reading the newspaper or watching television    Moving or speaking so slowly that other people could have noticed.  Or the opposite - being so fidgety or restless that you have been moving around a lot more than usual    Thoughts that you would be better off dead, or hurting yourself in some way    PHQ2-9 Total Score    If you checked off any problems, how difficult have these problems made it for you to do your work, take care of things at home, or get along with other people    Depression Interventions/Treatment      There were no vitals filed for this visit.    Medications Reviewed Today     Reviewed by Sherren [name] FORBES KEN (Social Worker) on 01/30/24 at 1414  Med List Status: <None>   Medication  Order Taking? Sig Documenting Provider Last Dose Status Informant  acetaminophen  (TYLENOL ) 500 MG tablet [id number]  Take 500 mg by mouth as needed. [provider]  Active   losartan  (COZAAR ) 25 MG tablet [id number]  Take 1 tablet (25 mg total) by mouth daily. May increase in one week to 50 MG (two tablets) daily by mouth if BP remains consistently >130/80. Cannady, Jolene T, NP  Active   meclizine  (ANTIVERT ) 25 MG  tablet [id number]  Take 0.5 tablets (12.5 mg total) by mouth 3 (three) times daily as needed for dizziness. Melvin Pao, NP  Active   ondansetron  (ZOFRAN -ODT) 4 MG disintegrating tablet [id number]  Take 1 tablet (4 mg total) by mouth every 8 (eight) hours as needed for nausea or vomiting. Clarine Ozell LABOR, MD  Active   vitamin B-12 (CYANOCOBALAMIN ) 500 MCG tablet [id number]  Take 500 mcg by mouth daily. [provider]  Active   XARELTO  20 MG TABS tablet [id number]  TAKE 1 TABLET BY MOUTH AT BEDTIME Rao, Archana C, MD  Active             Recommendation:   Continue Current Plan of Care Continue to utilize coping skills  Follow Up Plan:   Telephone follow-up 02/13/2024 at 11:00 am  [name] Ally, MSW, LCSW [place]  [facility], [facility] Health Licensed Clinical Social Worker Direct Dial: [phone]

## 2024-01-30 NOTE — Patient Instructions (Signed)
 Visit Information  Jasmine [name] was given information about Medicaid Managed Care team care coordination services as a part of their [facility] Medicaid benefit.   If you would like to schedule transportation through your [facility] plan, please call the following number at least 2 days in advance of your appointment: [phone].   You can also use the MTM portal or MTM mobile app to manage your rides. Reimbursement for transportation is available through [facility]! For the portal, please go to [email or web address]  Call the [facility] Crisis Line at [phone], at any time, 24 hours a day, 7 days a week. If you are in danger or need immediate medical attention call 911.   Patient verbalizes understanding of instructions and care plan provided today and agrees to view in MyChart. Active MyChart status and patient understanding of how to access instructions and care plan via MyChart confirmed with patient.     Licensed Clinical Social Worker will follow up on 02/13/2024 at 11:00 am  Jasmine [name], MSW, LCSW [place]  [facility], Population Health Licensed Clinical Social Worker Direct Dial: [phone]   Following is a copy of your plan of care:   Goals Addressed             This Visit's Progress    VBCI Social Work Care Plan:LCSW       Problems:   Disease Management support and education needs related to Anxiety with Excessive Worry, and Depression: disturbed sleep  CSW Clinical Goal(s):   Over the next 90 days the Patient will work with Child Psychotherapist to address concerns related to anxiety and depression as evidenced by patient receiving therapy and medication for her mental health needs.  Over next 2 weeks ,patient will get [facility] Psych[facility] and follow up and continue to utilize coping skills.  Interventions:  Mental Health:  Evaluation of current treatment plan related to Anxiety with Excessive Worry, and Depression:  disturbed sleep Active listening / Reflection utilized Behavioral Activation reviewed Depression screen reviewed that was completed last week. LCSW discussed the results of the GAD 7 results from last week. Discussed referral for psychiatry: LCSW will reach out for referral for medications. Discussed referral options to connect for ongoing therapy: Patient is amenable to therapy. Emotional Support Provided Motivational Interviewing employed Problem Solving /Task Center strategies reviewed LCSW and patient discussed patient's coping skills and LCSW encouraged patient to continue utilize coping skills. Solution-Focued Strategies employed: Suicidal Ideation/Homicidal Ideation assessed: No plan or intent. Patient provided with the suicide crisis line 84 LCSW discussed patient's SDOH needs screening about patient reports that she is not requesting assistance at this time.  Patient Goals/Self-Care Activities:  Connect with provider for ongoing mental health treatment.    Plan:   Telephone follow up appointment with care management team member scheduled for:  02/13/24 at 11:00 am

## 2024-02-05 ENCOUNTER — Other Ambulatory Visit: Payer: Self-pay

## 2024-02-05 NOTE — Patient Instructions (Signed)
 Visit Information  Ms. Modesitt was given information about Medicaid Managed Care team care coordination services as a part of their [facility] Medicaid benefit.   If you would like to schedule transportation through your [facility] plan, please call the following number at least 2 days in advance of your appointment: [phone].   You can also use the MTM portal or MTM mobile app to manage your rides. Reimbursement for transportation is available through [facility]! For the portal, please go to [email or web address]  Call the [facility] Crisis Line at [phone], at any time, 24 hours a day, 7 days a week. If you are in danger or need immediate medical attention call 911.  Please see education materials related to HTN provided by MyChart link.  Patient verbalizes understanding of instructions and care plan provided today and agrees to view in MyChart. Active MyChart status and patient understanding of how to access instructions and care plan via MyChart confirmed with patient.     RN Care Manager will Follow up 03/04/24  Caree Wolpert RN RN Care Manager [facility] [phone]   Following is a copy of your plan of care:   Goals Addressed             This Visit's Progress    VBCI RN Care Plan       Problems:  Hypertension  Goal: Over the next 90 days the Patient will attend all scheduled medical appointments: With PCP and SW as evidenced by medical record        demonstrate ongoing self health care management ability with HTN as evidenced by     take all medications exactly as prescribed and will call provider for medication related questions as evidenced by medication adherence     Interventions:   Hypertension Interventions: Last practice recorded BP readings:  BP Readings from Last 3 Encounters:  02/05/24 131/80  01/17/24 (!) 167/88  12/25/23 (!) 177/84   Most recent eGFR/CrCl:  Lab Results  Component Value Date   EGFR 101 01/17/2024    No  components found for: CRCL  Evaluation of current treatment plan related to hypertension self management and patient's adherence to plan as established by provider Provided education to patient re: stroke prevention, s/s of heart attack and stroke Reviewed medications with patient and discussed importance of compliance Counseled on the importance of exercise goals with target of 150 minutes per week Discussed plans with patient for ongoing care management follow up and provided patient with direct contact information for care management team Advised patient, providing education and rationale, to monitor blood pressure daily and record, calling PCP for findings outside established parameters Reviewed scheduled/upcoming provider appointments including:  Screening for signs and symptoms of depression related to chronic disease state  Assessed social determinant of health barriers  Patient Self-Care Activities:  Attend all scheduled provider appointments Call pharmacy for medication refills 3-7 days in advance of running out of medications Call provider office for new concerns or questions  Take medications as prescribed   check blood pressure daily write blood pressure results in a log or diary keep a blood pressure log take blood pressure log to all doctor appointments call doctor for signs and symptoms of high blood pressure keep all doctor appointments take medications for blood pressure exactly as prescribed report new symptoms to your doctor  Plan:  Follow up with provider re: Jolene Cannady 12/31 Telephone follow up appointment with care management team member scheduled for:  03/04/24

## 2024-02-05 NOTE — Patient Outreach (Signed)
 Complex Care Management   Visit Note  02/05/2024  Name:  Jasmine [name] MRN: [id number] DOB: 02-14-1961  Situation: Referral received for Complex Care Management related to HTN I obtained verbal consent from Patient.  Visit completed with Patient  on the phone  Background:   Past Medical History:  Diagnosis Date   Allergy    Anemia    Anxiety    Arthritis    LEFT SHOULDER   Depression    H/O cerebral venous sinus thrombosis 2013   History of hiatal hernia    History of kidney stones    Occipital neuralgia of right side     Assessment: Patient Reported Symptoms:  Cognitive Cognitive Status: No symptoms reported   Health Maintenance Behaviors: Annual physical exam Healing Pattern: Average  Neurological Neurological Review of Symptoms: No symptoms reported Neurological Management Strategies: Routine screening Neurological Self-Management Outcome: 4 (good)  HEENT HEENT Symptoms Reported: No symptoms reported HEENT Management Strategies: Routine screening HEENT Self-Management Outcome: 4 (good)    Cardiovascular Cardiovascular Symptoms Reported: No symptoms reported Does patient have uncontrolled Hypertension?: Yes Is patient checking Blood Pressure at home?: Yes Patient's Recent BP reading at home: 139/80 12/20 evening Cardiovascular Management Strategies: Routine screening Cardiovascular Self-Management Outcome: 4 (good)  Respiratory Respiratory Symptoms Reported: Not assesed Respiratory Management Strategies: Adequate rest, Routine screening  Endocrine Endocrine Symptoms Reported: No symptoms reported Is patient diabetic?: No Endocrine Self-Management Outcome: 4 (good)  Gastrointestinal Gastrointestinal Symptoms Reported: No symptoms reported Additional Gastrointestinal Details: recent bout with diarrhea Gastrointestinal Management Strategies: Diet modification Gastrointestinal Self-Management Outcome: 4 (good)    Genitourinary Genitourinary Symptoms Reported: No  symptoms reported Genitourinary Management Strategies: Adequate rest Genitourinary Self-Management Outcome: 4 (good)  Integumentary Integumentary Symptoms Reported: Not assessed Skin Management Strategies: Routine screening Skin Self-Management Outcome: 4 (good)  Musculoskeletal Musculoskelatal Symptoms Reviewed: No symptoms reported Musculoskeletal Management Strategies: Routine screening Musculoskeletal Self-Management Outcome: 4 (good) Falls in the past year?: No Number of falls in past year: 1 or less Was there an injury with Fall?: No Fall Risk Category Calculator: 0 Patient Fall Risk Level: Low Fall Risk Patient at Risk for Falls Due to: No Fall Risks Fall risk Follow up: Falls evaluation completed  Psychosocial Psychosocial Symptoms Reported: No symptoms reported Behavioral Management Strategies: Activity, Adequate rest, Coping strategies Behavioral Health Self-Management Outcome: 4 (good) Major Change/Loss/Stressor/Fears (CP): Death of a loved one, Traumatic event Behaviors When Feeling Stressed/Fearful: restless Techniques to Cope with Loss/Stress/Change: Exercise Quality of Family Relationships: supportive Do you feel physically threatened by others?: No    02/05/2024    PHQ2-9 Depression Screening   Little interest or pleasure in doing things Several days  Feeling down, depressed, or hopeless Several days  PHQ-2 - Total Score 2  Trouble falling or staying asleep, or sleeping too much Nearly every day  Feeling tired or having little energy Several days  Poor appetite or overeating  Not at all  Feeling bad about yourself - or that you are a failure or have let yourself or your family down Several days  Trouble concentrating on things, such as reading the newspaper or watching television Not at all  Moving or speaking so slowly that other people could have noticed.  Or the opposite - being so fidgety or restless that you have been moving around a lot more than usual Not at  all  Thoughts that you would be better off dead, or hurting yourself in some way Not at all  PHQ2-9 Total Score 7  If you  checked off any problems, how difficult have these problems made it for you to do your work, take care of things at home, or get along with other people Somewhat difficult  Depression Interventions/Treatment      Today's Vitals   02/05/24 0944  BP: 131/80   Pain Scale: 0-10 Pain Score: 0-No pain  Medications Reviewed Today     Reviewed by Nivia Wana Mount , RN (Registered Nurse) on 02/05/24 at 0940  Med List Status: <None>   Medication Order Taking? Sig Documenting Provider Last Dose Status Informant  acetaminophen  (TYLENOL ) 500 MG tablet [id number] Yes Take 500 mg by mouth as needed. [provider]  Active   losartan  (COZAAR ) 25 MG tablet [id number] Yes Take 1 tablet (25 mg total) by mouth daily. May increase in one week to 50 MG (two tablets) daily by mouth if BP remains consistently >130/80. Cannady, Jolene T, NP  Active   meclizine  (ANTIVERT ) 25 MG tablet [id number] Yes Take 0.5 tablets (12.5 mg total) by mouth 3 (three) times daily as needed for dizziness. Melvin Pao, NP  Active   ondansetron  (ZOFRAN -ODT) 4 MG disintegrating tablet [id number]  Take 1 tablet (4 mg total) by mouth every 8 (eight) hours as needed for nausea or vomiting.  Patient not taking: Reported on 02/05/2024   Clarine Ozell LABOR, MD  Active   vitamin B-12 (CYANOCOBALAMIN ) 500 MCG tablet [id number]  Take 500 mcg by mouth daily.  Patient not taking: Reported on 02/05/2024   [provider]  Active   XARELTO  20 MG TABS tablet [id number] Yes TAKE 1 TABLET BY MOUTH AT BEDTIME Rao, Archana C, MD  Active             Recommendation:   Continue Current Plan of Care  Follow Up Plan:   Telephone follow-up in 1 month  Wyonia Fontanella RN RN Care Manager [facility] [phone]

## 2024-02-10 NOTE — Patient Instructions (Signed)
 Be Involved in Caring For Your Health:  Taking Medications When medications are taken as directed, they can greatly improve your health. But if they are not taken as prescribed, they may not work. In some cases, not taking them correctly can be harmful. To help ensure your treatment remains effective and safe, understand your medications and how to take them. Bring your medications to each visit for review by your provider.  Your lab results, notes, and after visit summary will be available on My Chart. We strongly encourage you to use this feature. If lab results are abnormal the clinic will contact you with the appropriate steps. If the clinic does not contact you assume the results are satisfactory. You can always view your results on My Chart. If you have questions regarding your health or results, please contact the clinic during office hours. You can also ask questions on My Chart.  We at [facility] are grateful that you chose us  to provide your care. We strive to provide evidence-based and compassionate care and are always looking for feedback. If you get a survey from the clinic please complete this so we can hear your opinions.  DASH Eating Plan DASH stands for Dietary Approaches to Stop Hypertension. The DASH eating plan is a healthy eating plan that has been shown to: Lower high blood pressure (hypertension). Reduce your risk for type 2 diabetes, heart disease, and stroke. Help with weight loss. What are tips for following this plan? Reading food labels Check food labels for the amount of salt (sodium) per serving. Choose foods with less than 5 percent of the Daily Value (DV) of sodium. In general, foods with less than 300 milligrams (mg) of sodium per serving fit into this eating plan. To find whole grains, look for the word whole as the first word in the ingredient list. Shopping Buy products labeled as low-sodium or no salt added. Buy fresh foods. Avoid canned  foods and pre-made or frozen meals. Cooking Try not to add salt when you cook. Use salt-free seasonings or herbs instead of table salt or sea salt. Check with your health care provider or pharmacist before using salt substitutes. Do not fry foods. Cook foods in healthy ways, such as baking, boiling, grilling, roasting, or broiling. Cook using oils that are good for your heart. These include olive, canola, avocado, soybean, and sunflower oil. Meal planning  Eat a balanced diet. This should include: 4 or more servings of fruits and 4 or more servings of vegetables each day. Try to fill half of your plate with fruits and vegetables. 6-8 servings of whole grains each day. 6 or less servings of lean meat, poultry, or fish each day. 1 oz is 1 serving. A 3 oz (85 g) serving of meat is about the same size as the palm of your hand. One egg is 1 oz (28 g). 2-3 servings of low-fat dairy each day. One serving is 1 cup (237 mL). 1 serving of nuts, seeds, or beans 5 times each week. 2-3 servings of heart-healthy fats. Healthy fats called omega-3 fatty acids are found in foods such as walnuts, flaxseeds, fortified milks, and eggs. These fats are also found in cold-water fish, such as sardines, salmon, and mackerel. Limit how much you eat of: Canned or prepackaged foods. Food that is high in trans fat, such as fried foods. Food that is high in saturated fat, such as fatty meat. Desserts and other sweets, sugary drinks, and other foods with added sugar. Full-fat  dairy products. Do not salt foods before eating. Do not eat more than 4 egg yolks a week. Try to eat at least 2 vegetarian meals a week. Eat more home-cooked food and less restaurant, buffet, and fast food. Lifestyle When eating at a restaurant, ask if your food can be made with less salt or no salt. If you drink alcohol: Limit how much you have to: 0-1 drink a day if you are female. 0-2 drinks a day if you are female. Know how much alcohol is in  your drink. In the [place] one drink is one 12 oz bottle of beer (355 mL), one 5 oz glass of wine (148 mL), or one 1 oz glass of hard liquor (44 mL). General information Avoid eating more than 2,300 mg of salt a day. If you have hypertension, you may need to reduce your sodium intake to 1,500 mg a day. Work with your provider to stay at a healthy body weight or lose weight. Ask what the best weight range is for you. On most days of the week, get at least 30 minutes of exercise that causes your heart to beat faster. This may include walking, swimming, or biking. Work with your provider or dietitian to adjust your eating plan to meet your specific calorie needs. What foods should I eat? Fruits All fresh, dried, or frozen fruit. Canned fruits that are in their natural juice and do not have sugar added to them. Vegetables Fresh or frozen vegetables that are raw, steamed, roasted, or grilled. Low-sodium or reduced-sodium tomato and vegetable juice. Low-sodium or reduced-sodium tomato sauce and tomato paste. Low-sodium or reduced-sodium canned vegetables. Grains Whole-grain or whole-wheat bread. Whole-grain or whole-wheat pasta. Brown rice. [place]. Bulgur. Whole-grain and low-sodium cereals. Pita bread. Low-fat, low-sodium crackers. Whole-wheat flour tortillas. Meats and other proteins Skinless chicken or [place]. Ground chicken or [place]. Pork with fat trimmed off. Fish and seafood. Egg whites. Dried beans, peas, or lentils. Unsalted nuts, nut butters, and seeds. Unsalted canned beans. Lean cuts of beef with fat trimmed off. Low-sodium, lean precooked or cured meat, such as sausages or meat loaves. Dairy Low-fat (1%) or fat-free (skim) milk. Reduced-fat, low-fat, or fat-free cheeses. Nonfat, low-sodium ricotta or cottage cheese. Low-fat or nonfat yogurt. Low-fat, low-sodium cheese. Fats and oils Soft margarine without trans fats. Vegetable oil. Reduced-fat, low-fat, or light mayonnaise and salad  dressings (reduced-sodium). Canola, safflower, olive, avocado, soybean, and sunflower oils. Avocado. Seasonings and condiments Herbs. Spices. Seasoning mixes without salt. Other foods Unsalted popcorn and pretzels. Fat-free sweets. The items listed above may not be all the foods and drinks you can have. Talk to a dietitian to learn more. What foods should I avoid? Fruits Canned fruit in a light or heavy syrup. Fried fruit. Fruit in cream or butter sauce. Vegetables Creamed or fried vegetables. Vegetables in a cheese sauce. Regular canned vegetables that are not marked as low-sodium or reduced-sodium. Regular canned tomato sauce and paste that are not marked as low-sodium or reduced-sodium. Regular tomato and vegetable juices that are not marked as low-sodium or reduced-sodium. Dene. Olives. Grains Baked goods made with fat, such as croissants, muffins, or some breads. Dry pasta or rice meal packs. Meats and other proteins Fatty cuts of meat. Ribs. Fried meat. [place]. Bologna, salami, and other precooked or cured meats, such as sausages or meat loaves, that are not lean and low in sodium. Fat from the back of a pig (fatback). Bratwurst. Salted nuts and seeds. Canned beans with added salt. Canned  or smoked fish. Whole eggs or egg yolks. Chicken or [place] with skin. Dairy Whole or 2% milk, cream, and half-and-half. Whole or full-fat cream cheese. Whole-fat or sweetened yogurt. Full-fat cheese. Nondairy creamers. Whipped toppings. Processed cheese and cheese spreads. Fats and oils Butter. Stick margarine. Lard. Shortening. Ghee. Bacon fat. Tropical oils, such as coconut, palm kernel, or palm oil. Seasonings and condiments Onion salt, garlic salt, seasoned salt, table salt, and sea salt. Worcestershire sauce. Tartar sauce. Barbecue sauce. Teriyaki sauce. Soy sauce, including reduced-sodium soy sauce. Steak sauce. Canned and packaged gravies. Fish sauce. Oyster sauce. Cocktail sauce. Store-bought  horseradish. Ketchup. Mustard. Meat flavorings and tenderizers. Bouillon cubes. Hot sauces. Pre-made or packaged marinades. Pre-made or packaged taco seasonings. Relishes. Regular salad dressings. Other foods Salted popcorn and pretzels. The items listed above may not be all the foods and drinks you should avoid. Talk to a dietitian to learn more. Where to find more information National Heart, Lung, and Blood Institute (NHLBI): BuffaloDryCleaner.gl American Heart Association (AHA): heart.org Academy of Nutrition and Dietetics: eatright.org National Kidney Foundation (NKF): kidney.org This information is not intended to replace advice given to you by your health care provider. Make sure you discuss any questions you have with your health care provider. Document Revised: 02/17/2022 Document Reviewed: 02/17/2022 Elsevier Patient Education  2024 [employer].

## 2024-02-13 ENCOUNTER — Other Ambulatory Visit: Payer: Self-pay | Admitting: Nurse Practitioner

## 2024-02-13 ENCOUNTER — Other Ambulatory Visit: Payer: Self-pay | Admitting: Licensed Clinical Social Worker

## 2024-02-13 DIAGNOSIS — F418 Other specified anxiety disorders: Secondary | ICD-10-CM

## 2024-02-13 NOTE — Patient Instructions (Signed)
 Visit Information  Thank you for taking time to visit with me today. Please don't hesitate to contact me if I can be of assistance to you before our next scheduled appointment.  Our next appointment is by telephone on 02/27/24 at 11am. Please call the care guide team at [phone] if you need to cancel or reschedule your appointment.   Following is a copy of your care plan:   Goals Addressed             This Visit's Progress    [facility] Social Work Care Plan:LCSW       Problems:   Disease Management support and education needs related to Anxiety with Excessive Worry, and Depression: disturbed sleep  CSW Clinical Goal(s):   Over the next 90 days the Patient will work with Child Psychotherapist to address concerns related to anxiety and depression as evidenced by patient receiving therapy and medication for her mental health needs.  Over next 2 weeks ,patient will get referral for [facility] Psychiatrist and follow up and continue to utilize coping skills.  Interventions:  Mental Health:  Evaluation of current treatment plan related to Anxiety with Excessive Worry, and Depression: disturbed sleep Active listening / Reflection utilized Behavioral Activation reviewed Depression screen reviewed that was completed last week. LCSW discussed the results of the GAD 7 results from last week. Discussed referral for psychiatry: LCSW will reach out for referral for medications. Discussed referral options to connect for ongoing therapy: Patient is amenable to therapy. Emotional Support Provided Motivational Interviewing employed Problem Solving /Task Center strategies reviewed LCSW and patient discussed patient's coping skills and LCSW encouraged patient to continue utilize coping skills. Solution-Focued Strategies employed: Suicidal Ideation/Homicidal Ideation assessed: No plan or intent. Patient provided with the suicide crisis line 3 LCSW discussed patient's SDOH needs screening about  patient reports that she is not requesting assistance at this time.  Patient Goals/Self-Care Activities:  Connect with provider for ongoing mental health treatment.    Plan:   Telephone follow up appointment with care management team member scheduled for:  02/27/2024 at 11:00 am with [name] Ally        Please call 911 if you are experiencing a Mental Health or Behavioral Health Crisis or need someone to talk to.  Patient verbalized understanding of Care plan and visit instructions communicated this visit  Cena Ligas, LCSW Clinical Social Worker [facility] [employer]

## 2024-02-13 NOTE — Patient Outreach (Signed)
 Complex Care Management   Visit Note  02/13/2024  Name:  Jasmine [name] MRN: [id number] DOB: 01/03/1961  Situation: Referral received for Complex Care Management related to Substance Abuse/Misuse anxiety and depression.  I obtained verbal consent from Patient.  Visit completed with Patient  on the phone.  Background:   Past Medical History:  Diagnosis Date   Allergy    Anemia    Anxiety    Arthritis    LEFT SHOULDER   Depression    H/O cerebral venous sinus thrombosis 2013   History of hiatal hernia    History of kidney stones    Occipital neuralgia of right side     Assessment: LCSW called patient on the phone. LCSW introduced self and explained reason for the call. Patient reports she is doing well. Patient reports that she has no needs at this time and will follow up with LCSW [name].   Patient Reported Symptoms:  Cognitive Cognitive Status: Alert and oriented to person, place, and time, Normal speech and language skills Cognitive/Intellectual Conditions Management [RPT]: None reported or documented in medical history or problem list      Neurological Neurological Review of Symptoms: Not assessed    HEENT HEENT Symptoms Reported: Not assessed      Cardiovascular Cardiovascular Symptoms Reported: Not assessed    Respiratory Respiratory Symptoms Reported: Not assesed    Endocrine Endocrine Symptoms Reported: Not assessed    Gastrointestinal Gastrointestinal Symptoms Reported: Not assessed      Genitourinary Genitourinary Symptoms Reported: Not assessed    Integumentary Integumentary Symptoms Reported: Not assessed    Musculoskeletal Musculoskelatal Symptoms Reviewed: Not assessed        Psychosocial Psychosocial Symptoms Reported: No symptoms reported Additional Psychological Details: Patient reports feeling pretty good right now          02/13/2024    PHQ2-9 Depression Screening   Little interest or pleasure in doing things    Feeling down, depressed,  or hopeless    PHQ-2 - Total Score    Trouble falling or staying asleep, or sleeping too much    Feeling tired or having little energy    Poor appetite or overeating     Feeling bad about yourself - or that you are a failure or have let yourself or your family down    Trouble concentrating on things, such as reading the newspaper or watching television    Moving or speaking so slowly that other people could have noticed.  Or the opposite - being so fidgety or restless that you have been moving around a lot more than usual    Thoughts that you would be better off dead, or hurting yourself in some way    PHQ2-9 Total Score    If you checked off any problems, how difficult have these problems made it for you to do your work, take care of things at home, or get along with other people    Depression Interventions/Treatment      There were no vitals filed for this visit.    Medications Reviewed Today     Reviewed by Veva Bolt, LCSW (Social Worker) on 02/13/24 at 1111  Med List Status: <None>   Medication Order Taking? Sig Documenting Provider Last Dose Status Informant  acetaminophen  (TYLENOL ) 500 MG tablet [id number]  Take 500 mg by mouth as needed. [provider]  Active   losartan  (COZAAR ) 25 MG tablet [id number]  Take 1 tablet (25 mg total) by mouth daily. May increase in  one week to 50 MG (two tablets) daily by mouth if BP remains consistently >130/80. Cannady, Jolene T, NP  Active   meclizine  (ANTIVERT ) 25 MG tablet [id number]  Take 0.5 tablets (12.5 mg total) by mouth 3 (three) times daily as needed for dizziness. Melvin Pao, NP  Active   ondansetron  (ZOFRAN -ODT) 4 MG disintegrating tablet [id number]  Take 1 tablet (4 mg total) by mouth every 8 (eight) hours as needed for nausea or vomiting.  Patient not taking: Reported on 02/05/2024   Clarine Ozell LABOR, MD  Active   vitamin B-12 (CYANOCOBALAMIN ) 500 MCG tablet [id number]  Take 500 mcg by mouth daily.  Patient not  taking: Reported on 02/05/2024   [provider]  Active   XARELTO  20 MG TABS tablet [id number]  TAKE 1 TABLET BY MOUTH AT BEDTIME Rao, Archana C, MD  Active             Recommendation:   PCP Follow-up Continue Current Plan of Care  Follow Up Plan:   Telephone follow up appointment date/time:  02/27/24 at 11am with [name] B.  Cena Ligas, LCSW Clinical Social Worker [facility]

## 2024-02-14 ENCOUNTER — Ambulatory Visit: Admitting: Nurse Practitioner

## 2024-02-14 ENCOUNTER — Encounter: Payer: Self-pay | Admitting: Nurse Practitioner

## 2024-02-14 VITALS — BP 138/86 | HR 62 | Temp 97.8°F | Resp 16 | Ht 63.5 in | Wt 190.4 lb

## 2024-02-14 DIAGNOSIS — I10 Essential (primary) hypertension: Secondary | ICD-10-CM

## 2024-02-14 MED ORDER — LOSARTAN POTASSIUM 100 MG PO TABS: 100.0000 mg | ORAL_TABLET | Freq: Every day | ORAL | 3 refills | Status: AC

## 2024-02-14 NOTE — Progress Notes (Signed)
 "  BP 138/86 (BP Location: Left Arm, Patient Position: Sitting, Cuff Size: Normal)   Pulse 62   Temp 97.8 F (36.6 C) (Oral)   Resp 16   Ht 5' 3.5 (1.613 m)   Wt 190 lb 6.4 oz (86.4 kg)   SpO2 97%   BMI 33.19 kg/m    Subjective:    Patient ID: Jasmine [name], female    DOB: June 09, 1960, 63 y.o.   MRN: [id number]  HPI: Jasmine [name] is a 63 y.o. female  Chief Complaint  Patient presents with   Follow-up    Here for follow up since starting losartan . No major concerns.   HYPERTENSION without Chronic Kidney Disease Started on Losartan  01/17/24 with no ADR -- taking 50 MG. Slight dry mouth only. Hypertension status: uncontrolled  Satisfied with current treatment? yes Duration of hypertension: chronic BP monitoring frequency:  daily BP range: 137/83 this morning at home -- fluctuating all over BP medication side effects:  no Medication compliance: good compliance Aspirin: no Recurrent headaches: no Visual changes: no Palpitations: no Dyspnea: no Chest pain: no Lower extremity edema: no Dizzy/lightheaded: no   Relevant past medical, surgical, family and social history reviewed and updated as indicated. Interim medical history since our last visit reviewed. Allergies and medications reviewed and updated.  Review of Systems  Constitutional:  Negative for activity change, appetite change, diaphoresis, fatigue and fever.  Respiratory:  Negative for cough, chest tightness and shortness of breath.   Cardiovascular:  Negative for chest pain, palpitations and leg swelling.  Gastrointestinal: Negative.   Endocrine: Negative for cold intolerance, heat intolerance, polydipsia, polyphagia and polyuria.  Neurological: Negative.   Psychiatric/Behavioral: Negative.      Per HPI unless specifically indicated above     Objective:    BP 138/86 (BP Location: Left Arm, Patient Position: Sitting, Cuff Size: Normal)   Pulse 62   Temp 97.8 F (36.6 C) (Oral)   Resp 16   Ht 5' 3.5  (1.613 m)   Wt 190 lb 6.4 oz (86.4 kg)   SpO2 97%   BMI 33.19 kg/m   Wt Readings from Last 3 Encounters:  02/14/24 190 lb 6.4 oz (86.4 kg)  01/22/24 187 lb (84.8 kg)  01/17/24 187 lb 12.8 oz (85.2 kg)    Physical Exam Vitals and nursing note reviewed.  Constitutional:      General: She is awake. She is not in acute distress.    Appearance: She is well-developed and well-groomed. She is obese. She is not ill-appearing or toxic-appearing.  HENT:     Head: Normocephalic.     Right Ear: Hearing and external ear normal.     Left Ear: Hearing and external ear normal.  Eyes:     General: Lids are normal.        Right eye: No discharge.        Left eye: No discharge.     Conjunctiva/sclera: Conjunctivae normal.     Pupils: Pupils are equal, round, and reactive to light.  Neck:     Thyroid : No thyromegaly.     Vascular: No carotid bruit.  Cardiovascular:     Rate and Rhythm: Normal rate and regular rhythm.     Heart sounds: Murmur heard.     Systolic murmur is present with a grade of 2/6.     No gallop.  Pulmonary:     Effort: Pulmonary effort is normal. No accessory muscle usage or respiratory distress.     Breath sounds: Normal  breath sounds.  Abdominal:     General: Bowel sounds are normal. There is no distension.     Palpations: Abdomen is soft.     Tenderness: There is no abdominal tenderness.  Musculoskeletal:     Cervical back: Normal range of motion and neck supple.     Right lower leg: No edema.     Left lower leg: No edema.  Lymphadenopathy:     Cervical: No cervical adenopathy.  Skin:    General: Skin is warm and dry.  Neurological:     Mental Status: She is alert and oriented to person, place, and time.     Deep Tendon Reflexes: Reflexes are normal and symmetric.     Reflex Scores:      Brachioradialis reflexes are 2+ on the right side and 2+ on the left side.      Patellar reflexes are 2+ on the right side and 2+ on the left side. Psychiatric:         Attention and Perception: Attention normal.        Mood and Affect: Mood normal.        Speech: Speech normal.        Behavior: Behavior normal. Behavior is cooperative.        Thought Content: Thought content normal.    Results for orders placed or performed in visit on 01/17/24  Basic metabolic panel with GFR   Collection Time: 01/17/24  8:47 AM  Result Value Ref Range   Glucose 96 70 - 99 mg/dL   BUN 10 8 - 27 mg/dL   Creatinine, Ser 9.39 0.57 - 1.00 mg/dL   eGFR 898 >40 fO/fpw/8.26   BUN/Creatinine Ratio 17 12 - 28   Sodium 140 134 - 144 mmol/L   Potassium 4.9 3.5 - 5.2 mmol/L   Chloride 101 96 - 106 mmol/L   CO2 25 20 - 29 mmol/L   Calcium  9.3 8.7 - 10.3 mg/dL      Assessment & Plan:   Problem List Items Addressed This Visit       Cardiovascular and Mediastinum   Essential hypertension - Primary   Chronic and exacerbated by recent stressors but is trending down with medication on board. Has taken Amlodipine  and HCTZ in past with side effects. Increase Losartan  to 100 MG daily since is tolerating this. Educated her on this medication and side effects to monitor for and report. Recommend she monitor BP at least a few mornings a week at home and document.  DASH diet at home.  Labs today: BMP. Can take Biotene for dry mouth, OTC lozenges or mouth rinse.      Relevant Medications   losartan  (COZAAR ) 100 MG tablet   Other Relevant Orders   Basic metabolic panel with GFR     Follow up plan: Return in about 4 weeks (around 03/13/2024) for HTN -- increased Losartan  to 100 MG daily on 02/14/24.      "

## 2024-02-14 NOTE — Assessment & Plan Note (Addendum)
 Chronic and exacerbated by recent stressors but is trending down with medication on board. Has taken Amlodipine  and HCTZ in past with side effects. Increase Losartan  to 100 MG daily since is tolerating this. Educated her on this medication and side effects to monitor for and report. Recommend she monitor BP at least a few mornings a week at home and document.  DASH diet at home.  Labs today: BMP. Can take Biotene for dry mouth, OTC lozenges or mouth rinse.

## 2024-02-15 ENCOUNTER — Ambulatory Visit: Payer: Self-pay | Admitting: Nurse Practitioner

## 2024-02-15 LAB — BASIC METABOLIC PANEL WITH GFR
BUN/Creatinine Ratio: 11 — ABNORMAL LOW (ref 12–28)
BUN: 7 mg/dL — ABNORMAL LOW (ref 8–27)
CO2: 25 mmol/L (ref 20–29)
Calcium: 9.1 mg/dL (ref 8.7–10.3)
Chloride: 100 mmol/L (ref 96–106)
Creatinine, Ser: 0.63 mg/dL (ref 0.57–1.00)
Glucose: 87 mg/dL (ref 70–99)
Potassium: 4.6 mmol/L (ref 3.5–5.2)
Sodium: 140 mmol/L (ref 134–144)
eGFR: 100 mL/min/1.73

## 2024-02-15 NOTE — Progress Notes (Signed)
 Contacted via MyChart  Good morning Jasmine [name], your kidney function and potassium remain in normal range. No changes needed. Great news!! Keep being awesome!!  Thank you for allowing me to participate in your care.  I appreciate you. Kindest regards, Sarafina Puthoff

## 2024-02-18 ENCOUNTER — Encounter: Payer: Self-pay | Admitting: Nurse Practitioner

## 2024-02-27 ENCOUNTER — Other Ambulatory Visit: Payer: Self-pay

## 2024-02-27 NOTE — Patient Outreach (Signed)
 Complex Care Management   Visit Note  02/27/2024  Name:  Jasmine [name] MRN: [id number] DOB: [birth year]  Situation: RReferral received for Complex Care Management related to Mental/Behavioral Health diagnosis anxiety and depression. I obtained verbal consent from Patient.  Visit completed with Patient  on the phone. LCSW and patient discussed if patient had heard from [facility]. Patient has not heard from them. Patient explained that she is concerned about her insurance coverage therefore will like to hold on LCSW following up. Another outreach scheduled for next week. Past Medical History:  Diagnosis Date   Allergy    Anemia    Anxiety    Arthritis    LEFT SHOULDER   Depression    H/O cerebral venous sinus thrombosis 2013   History of hiatal hernia    History of kidney stones    Occipital neuralgia of right side     Assessment: Patient Reported Symptoms:  Cognitive Cognitive Status: Normal speech and language skills, Alert and oriented to person, place, and time Cognitive/Intellectual Conditions Management [RPT]: None reported or documented in medical history or problem list      Neurological Neurological Review of Symptoms: Not assessed    HEENT HEENT Symptoms Reported: Not assessed      Cardiovascular Cardiovascular Symptoms Reported: Not assessed    Respiratory Respiratory Symptoms Reported: Not assesed    Endocrine Endocrine Symptoms Reported: Not assessed    Gastrointestinal Gastrointestinal Symptoms Reported: Not assessed      Genitourinary Genitourinary Symptoms Reported: Not assessed    Integumentary Integumentary Symptoms Reported: Not assessed    Musculoskeletal Musculoskelatal Symptoms Reviewed: Not assessed        Psychosocial Psychosocial Symptoms Reported: No symptoms reported, Anxiety - if selected complete GAD, Depression - if selected complete PHQ 2-9 (Patient reports that anxiety and depression are stable no SI/HI) Additional  Psychological Details: Patient is stressed today due to unknown about her insurance Behavioral Management Strategies:  (Patient is wanting medication and counseling)        02/27/2024    PHQ2-9 Depression Screening   Little interest or pleasure in doing things    Feeling down, depressed, or hopeless    PHQ-2 - Total Score    Trouble falling or staying asleep, or sleeping too much    Feeling tired or having little energy    Poor appetite or overeating     Feeling bad about yourself - or that you are a failure or have let yourself or your family down    Trouble concentrating on things, such as reading the newspaper or watching television    Moving or speaking so slowly that other people could have noticed.  Or the opposite - being so fidgety or restless that you have been moving around a lot more than usual    Thoughts that you would be better off dead, or hurting yourself in some way    PHQ2-9 Total Score    If you checked off any problems, how difficult have these problems made it for you to do your work, take care of things at home, or get along with other people    Depression Interventions/Treatment      There were no vitals filed for this visit.    Medications Reviewed Today     Reviewed by Sherren [name] FORBES KEN (Social Worker) on 02/27/24 at 1216  Med List Status: <None>   Medication Order Taking? Sig Documenting Provider Last Dose Status Informant  acetaminophen  (TYLENOL ) 500 MG tablet [id number]  Take 500 mg by mouth as needed. [provider]  Active   losartan  (COZAAR ) 100 MG tablet [id number]  Take 1 tablet (100 mg total) by mouth daily. Cannady, Jolene T, NP  Active   meclizine  (ANTIVERT ) 25 MG tablet [id number]  Take 0.5 tablets (12.5 mg total) by mouth 3 (three) times daily as needed for dizziness. Melvin Pao, NP  Active   ondansetron  (ZOFRAN -ODT) 4 MG disintegrating tablet [id number]  Take 1 tablet (4 mg total) by mouth every 8 (eight) hours as needed for  nausea or vomiting. Clarine Ozell LABOR, MD  Active   XARELTO  20 MG TABS tablet [id number]  TAKE 1 TABLET BY MOUTH AT BEDTIME Rao, Archana C, MD  Active             Recommendation:   Continue Current Plan of Care  Follow Up Plan:   Telephone follow-up 03/12/2024 at 11:00 am  [name] Ally, MSW, LCSW [place]  [facility], [facility] Licensed Clinical Social Worker Direct Dial: [phone]

## 2024-02-27 NOTE — Patient Instructions (Signed)
 Visit Information  Thank you for taking time to visit with me today. Please don't hesitate to contact me if I can be of assistance to you before our next scheduled appointment.  Your next care management appointment is by telephone on 02/26/2024 at 11:00 am     Please call the care guide team at [phone] if you need to cancel, schedule, or reschedule an appointment.   Please call the Suicide and Crisis Lifeline: 988 if you are experiencing a Mental Health or Behavioral Health Crisis or need someone to talk to.  [name] Ally, MSW, LCSW [place]  [facility], [facility] Licensed Clinical Social Worker Direct Dial: [phone]

## 2024-03-04 ENCOUNTER — Other Ambulatory Visit: Payer: Self-pay

## 2024-03-04 ENCOUNTER — Telehealth: Payer: Self-pay

## 2024-03-04 VITALS — BP 102/78

## 2024-03-04 DIAGNOSIS — I10 Essential (primary) hypertension: Secondary | ICD-10-CM

## 2024-03-04 NOTE — Patient Instructions (Signed)
 Visit Information  Thank you for taking time to visit with me today. Please don't hesitate to contact me if I can be of assistance to you before our next scheduled appointment.  Your next care management appointment is by telephone on 03/25/24 at 11:00am  Telephone follow-up in 1 month  Please call the care guide team at [phone] if you need to cancel, schedule, or reschedule an appointment.   Please call the Suicide and Crisis Lifeline: 988 if you are experiencing a Mental Health or Behavioral Health Crisis or need someone to talk to.  Kaitlen Redford RN RN Care Manager [facility] [phone]

## 2024-03-04 NOTE — Patient Outreach (Signed)
 Complex Care Management   Visit Note  03/04/2024  Name:  Jasmine [name] MRN: [id number] DOB: 09/18/1960  Situation: Referral received for Complex Care Management related to htn I obtained verbal consent from Patient.  Visit completed with Patient  on the phone  Background:   Past Medical History:  Diagnosis Date   Allergy    Anemia    Anxiety    Arthritis    LEFT SHOULDER   Depression    H/O cerebral venous sinus thrombosis 2013   History of hiatal hernia    History of kidney stones    Occipital neuralgia of right side     Assessment: Patient Reported Symptoms:  Cognitive Cognitive Status: No symptoms reported Cognitive/Intellectual Conditions Management [RPT]: None reported or documented in medical history or problem list   Health Maintenance Behaviors: Annual physical exam Healing Pattern: Average  Neurological Neurological Review of Symptoms: No symptoms reported Neurological Management Strategies: Routine screening Neurological Self-Management Outcome: 4 (good)  HEENT HEENT Symptoms Reported: No symptoms reported HEENT Management Strategies: Routine screening HEENT Self-Management Outcome: 4 (good)    Cardiovascular Cardiovascular Symptoms Reported: No symptoms reported Does patient have uncontrolled Hypertension?: Yes Is patient checking Blood Pressure at home?: Yes Patient's Recent BP reading at home: 105/55 high 140/78 after activity Cardiovascular Management Strategies: Routine screening Cardiovascular Self-Management Outcome: 4 (good)  Respiratory Respiratory Symptoms Reported: No symptoms reported Respiratory Management Strategies: Routine screening Respiratory Self-Management Outcome: 4 (good)  Endocrine Endocrine Symptoms Reported: No symptoms reported Is patient diabetic?: No Endocrine Self-Management Outcome: 4 (good)  Gastrointestinal Gastrointestinal Symptoms Reported: Constipation, Diarrhea Additional Gastrointestinal Details: stress  related Gastrointestinal Management Strategies: Diet modification Gastrointestinal Self-Management Outcome: 4 (good) Gastrointestinal Comment: drinks lots of water    Genitourinary Genitourinary Symptoms Reported: No symptoms reported Genitourinary Management Strategies: Adequate rest Genitourinary Self-Management Outcome: 4 (good)  Integumentary Integumentary Symptoms Reported: No symptoms reported Skin Management Strategies: Routine screening Skin Self-Management Outcome: 4 (good)  Musculoskeletal Musculoskelatal Symptoms Reviewed: Joint pain Musculoskeletal Management Strategies: Routine screening Musculoskeletal Self-Management Outcome: 4 (good) Falls in the past year?: No Number of falls in past year: 1 or less Patient at Risk for Falls Due to: No Fall Risks Fall risk Follow up: Falls evaluation completed  Psychosocial Psychosocial Symptoms Reported: No symptoms reported Additional Psychological Details: stressed about ins Behavioral Management Strategies: Coping strategies Behavioral Health Self-Management Outcome: 4 (good) Major Change/Loss/Stressor/Fears (CP): Death of a loved one Behaviors When Feeling Stressed/Fearful: restless Techniques to Cope with Loss/Stress/Change: Exercise Quality of Family Relationships: supportive Do you feel physically threatened by others?: No    03/04/2024    PHQ2-9 Depression Screening   Little interest or pleasure in doing things Several days  Feeling down, depressed, or hopeless Several days  PHQ-2 - Total Score 2  Trouble falling or staying asleep, or sleeping too much Several days  Feeling tired or having little energy Several days  Poor appetite or overeating  Several days  Feeling bad about yourself - or that you are a failure or have let yourself or your family down Several days  Trouble concentrating on things, such as reading the newspaper or watching television Several days  Moving or speaking so slowly that other people could  have noticed.  Or the opposite - being so fidgety or restless that you have been moving around a lot more than usual Several days  Thoughts that you would be better off dead, or hurting yourself in some way Several days  PHQ2-9 Total Score 9  If you  checked off any problems, how difficult have these problems made it for you to do your work, take care of things at home, or get along with other people    Depression Interventions/Treatment      Today's Vitals   03/04/24 0942  BP: 102/78   Pain Scale: 0-10 Pain Score: 0-No pain  Medications Reviewed Today     Reviewed by Nivia, Juanmanuel Marohl , RN (Registered Nurse) on 03/04/24 at [phone]  Med List Status: <None>   Medication Order Taking? Sig Documenting Provider Last Dose Status Informant  acetaminophen  (TYLENOL ) 500 MG tablet [id number] Yes Take 500 mg by mouth as needed. [provider]  Active   losartan  (COZAAR ) 100 MG tablet [id number] Yes Take 1 tablet (100 mg total) by mouth daily. Cannady, Jolene T, NP  Active   meclizine  (ANTIVERT ) 25 MG tablet [id number] Yes Take 0.5 tablets (12.5 mg total) by mouth 3 (three) times daily as needed for dizziness. Melvin Pao, NP  Active   ondansetron  (ZOFRAN -ODT) 4 MG disintegrating tablet [id number] Yes Take 1 tablet (4 mg total) by mouth every 8 (eight) hours as needed for nausea or vomiting. Clarine Ozell LABOR, MD  Active   XARELTO  20 MG TABS tablet [id number] Yes TAKE 1 TABLET BY MOUTH AT BEDTIME Rao, Archana C, MD  Active             Recommendation:   Continue Current Plan of Care  Follow Up Plan:   Telephone follow-up in 1 month  Thedford Bunton RN RN Care Manager [facility] [phone]

## 2024-03-04 NOTE — Progress Notes (Signed)
" ° °  03/04/2024  Patient ID: Jasmine [name], female   DOB: 17-Sep-1960, 64 y.o.   MRN: [id number]  Returning missed call/voicemail from patient.  She recently submitted updated income verification to Medicaid, and she is unsure if her coverage will remain active or not.  If denied, she plans to appeal decision; but we should be able to get Xarelto  through [facility] if she ends up without insurance for a period of time.  She just refilled a 3 month supply on 12/18, and has >2 month supply on hand at this time.  She will contact me once a response is received in regard to Medicaid coverage.  [name], PharmD, DPLA    "

## 2024-03-05 ENCOUNTER — Other Ambulatory Visit: Payer: Self-pay

## 2024-03-05 NOTE — Patient Outreach (Signed)
 Complex Care Management   Visit Note  03/05/2024  Name:  TACIA HINDLEY MRN: [id number] DOB: August 05, 1960  Situation: Referral received for Complex Care Management related to Mental/Behavioral Health diagnosis anxiety and depression. I obtained verbal consent from Patient.  Visit completed with Patient  on the phone. LCSW and patient discussed resources for patient's mental health needs. Patient is amenable to therapy and medications for mental health needs. Patient does not have resolution about insurance.  Background:   Past Medical History:  Diagnosis Date   Allergy    Anemia    Anxiety    Arthritis    LEFT SHOULDER   Depression    H/O cerebral venous sinus thrombosis 2013   History of hiatal hernia    History of kidney stones    Occipital neuralgia of right side     Assessment: Patient Reported Symptoms:  Cognitive Cognitive Status: Normal speech and language skills, Alert and oriented to person, place, and time Cognitive/Intellectual Conditions Management [RPT]: None reported or documented in medical history or problem list   Health Maintenance Behaviors: Annual physical exam Healing Pattern: Average  Neurological Neurological Review of Symptoms: Not assessed    HEENT HEENT Symptoms Reported: Not assessed      Cardiovascular Cardiovascular Symptoms Reported: Not assessed    Respiratory Respiratory Symptoms Reported: Not assesed    Endocrine Endocrine Symptoms Reported: Not assessed    Gastrointestinal Gastrointestinal Symptoms Reported: Not assessed      Genitourinary Genitourinary Symptoms Reported: Not assessed    Integumentary Integumentary Symptoms Reported: Not assessed    Musculoskeletal Musculoskelatal Symptoms Reviewed: Not assessed        Psychosocial Psychosocial Symptoms Reported: Anxiety - if selected complete GAD, Depression - if selected complete PHQ 2-9 (depression screening completed yesterday) Additional Psychological Details: Patient is still  stressed about insurance coverage Behavioral Management Strategies: Coping strategies Major Change/Loss/Stressor/Fears (CP): Death of a loved one (Patient reports a loss of a pet) Techniques to Cope with Loss/Stress/Change: Exercise Quality of Family Relationships: supportive Do you feel physically threatened by others?: No      03/04/2024    9:49 AM 02/14/2024   10:52 AM 02/05/2024    9:52 AM 01/22/2024   10:06 AM 01/17/2024    8:16 AM  Depression screen PHQ 2/9  Decreased Interest 1 1 1  0 0  Down, Depressed, Hopeless 1 1 1  0 2  PHQ - 2 Score 2 2 2  0 2  Altered sleeping 1 2 3 3 3   Tired, decreased energy 1 1 1 1 2   Change in appetite 1 1 0 0 1  Feeling bad or failure about yourself  1 0 1 1 2   Trouble concentrating 1 0 0 0 0  Moving slowly or fidgety/restless 1 0 0 0 0  Suicidal thoughts 1 0 0 0 0  PHQ-9 Score 9 6 7 5 10   Difficult doing work/chores   Somewhat difficult Somewhat difficult Somewhat difficult       There were no vitals filed for this visit.    Medications Reviewed Today     Reviewed by Sherren [name] FORBES KEN (Social Worker) on 03/05/24 at 1109  Med List Status: <None>   Medication Order Taking? Sig Documenting Provider Last Dose Status Informant  acetaminophen  (TYLENOL ) 500 MG tablet [id number]  Take 500 mg by mouth as needed. [provider]  Active   losartan  (COZAAR ) 100 MG tablet [id number]  Take 1 tablet (100 mg total) by mouth daily. Cannady, Jolene T, NP  Active  meclizine  (ANTIVERT ) 25 MG tablet [id number]  Take 0.5 tablets (12.5 mg total) by mouth 3 (three) times daily as needed for dizziness. Melvin Pao, NP  Active   ondansetron  (ZOFRAN -ODT) 4 MG disintegrating tablet [id number]  Take 1 tablet (4 mg total) by mouth every 8 (eight) hours as needed for nausea or vomiting. Clarine Ozell LABOR, MD  Active   XARELTO  20 MG TABS tablet [id number]  TAKE 1 TABLET BY MOUTH AT BEDTIME Rao, Archana C, MD  Active             Recommendation:    Continue Current Plan of Care  Follow Up Plan:   Telephone follow-up 03/20/2024 at 11:00 am  [name] Ally, MSW, LCSW [place]  [facility], [facility] Licensed Clinical Social Worker Direct Dial: [phone]

## 2024-03-05 NOTE — Patient Instructions (Signed)
 Visit Information  Thank you for taking time to visit with me today. Please don't hesitate to contact me if I can be of assistance to you before our next scheduled appointment.  Your next care management appointment is by telephone on 03/20/2024 at 11:00 AM   Please call the care guide team at [phone] if you need to cancel, schedule, or reschedule an appointment.   Please call the Suicide and Crisis Lifeline: 988 if you are experiencing a Mental Health or Behavioral Health Crisis or need someone to talk to.  [name] Ally, MSW, LCSW [place]  [facility], [facility] Licensed Clinical Social Worker Direct Dial: [phone]

## 2024-03-09 NOTE — Patient Instructions (Signed)
 Be Involved in Caring For Your Health:  Taking Medications When medications are taken as directed, they can greatly improve your health. But if they are not taken as prescribed, they may not work. In some cases, not taking them correctly can be harmful. To help ensure your treatment remains effective and safe, understand your medications and how to take them. Bring your medications to each visit for review by your provider.  Your lab results, notes, and after visit summary will be available on My Chart. We strongly encourage you to use this feature. If lab results are abnormal the clinic will contact you with the appropriate steps. If the clinic does not contact you assume the results are satisfactory. You can always view your results on My Chart. If you have questions regarding your health or results, please contact the clinic during office hours. You can also ask questions on My Chart.  We at [facility] are grateful that you chose us  to provide your care. We strive to provide evidence-based and compassionate care and are always looking for feedback. If you get a survey from the clinic please complete this so we can hear your opinions.  DASH Eating Plan DASH stands for Dietary Approaches to Stop Hypertension. The DASH eating plan is a healthy eating plan that has been shown to: Lower high blood pressure (hypertension). Reduce your risk for type 2 diabetes, heart disease, and stroke. Help with weight loss. What are tips for following this plan? Reading food labels Check food labels for the amount of salt (sodium) per serving. Choose foods with less than 5 percent of the Daily Value (DV) of sodium. In general, foods with less than 300 milligrams (mg) of sodium per serving fit into this eating plan. To find whole grains, look for the word whole as the first word in the ingredient list. Shopping Buy products labeled as low-sodium or no salt added. Buy fresh foods. Avoid canned  foods and pre-made or frozen meals. Cooking Try not to add salt when you cook. Use salt-free seasonings or herbs instead of table salt or sea salt. Check with your health care provider or pharmacist before using salt substitutes. Do not fry foods. Cook foods in healthy ways, such as baking, boiling, grilling, roasting, or broiling. Cook using oils that are good for your heart. These include olive, canola, avocado, soybean, and sunflower oil. Meal planning  Eat a balanced diet. This should include: 4 or more servings of fruits and 4 or more servings of vegetables each day. Try to fill half of your plate with fruits and vegetables. 6-8 servings of whole grains each day. 6 or less servings of lean meat, poultry, or fish each day. 1 oz is 1 serving. A 3 oz (85 g) serving of meat is about the same size as the palm of your hand. One egg is 1 oz (28 g). 2-3 servings of low-fat dairy each day. One serving is 1 cup (237 mL). 1 serving of nuts, seeds, or beans 5 times each week. 2-3 servings of heart-healthy fats. Healthy fats called omega-3 fatty acids are found in foods such as walnuts, flaxseeds, fortified milks, and eggs. These fats are also found in cold-water fish, such as sardines, salmon, and mackerel. Limit how much you eat of: Canned or prepackaged foods. Food that is high in trans fat, such as fried foods. Food that is high in saturated fat, such as fatty meat. Desserts and other sweets, sugary drinks, and other foods with added sugar. Full-fat  dairy products. Do not salt foods before eating. Do not eat more than 4 egg yolks a week. Try to eat at least 2 vegetarian meals a week. Eat more home-cooked food and less restaurant, buffet, and fast food. Lifestyle When eating at a restaurant, ask if your food can be made with less salt or no salt. If you drink alcohol: Limit how much you have to: 0-1 drink a day if you are female. 0-2 drinks a day if you are female. Know how much alcohol is in  your drink. In the [place] one drink is one 12 oz bottle of beer (355 mL), one 5 oz glass of wine (148 mL), or one 1 oz glass of hard liquor (44 mL). General information Avoid eating more than 2,300 mg of salt a day. If you have hypertension, you may need to reduce your sodium intake to 1,500 mg a day. Work with your provider to stay at a healthy body weight or lose weight. Ask what the best weight range is for you. On most days of the week, get at least 30 minutes of exercise that causes your heart to beat faster. This may include walking, swimming, or biking. Work with your provider or dietitian to adjust your eating plan to meet your specific calorie needs. What foods should I eat? Fruits All fresh, dried, or frozen fruit. Canned fruits that are in their natural juice and do not have sugar added to them. Vegetables Fresh or frozen vegetables that are raw, steamed, roasted, or grilled. Low-sodium or reduced-sodium tomato and vegetable juice. Low-sodium or reduced-sodium tomato sauce and tomato paste. Low-sodium or reduced-sodium canned vegetables. Grains Whole-grain or whole-wheat bread. Whole-grain or whole-wheat pasta. Brown rice. [place]. Bulgur. Whole-grain and low-sodium cereals. Pita bread. Low-fat, low-sodium crackers. Whole-wheat flour tortillas. Meats and other proteins Skinless chicken or [place]. Ground chicken or [place]. Pork with fat trimmed off. Fish and seafood. Egg whites. Dried beans, peas, or lentils. Unsalted nuts, nut butters, and seeds. Unsalted canned beans. Lean cuts of beef with fat trimmed off. Low-sodium, lean precooked or cured meat, such as sausages or meat loaves. Dairy Low-fat (1%) or fat-free (skim) milk. Reduced-fat, low-fat, or fat-free cheeses. Nonfat, low-sodium ricotta or cottage cheese. Low-fat or nonfat yogurt. Low-fat, low-sodium cheese. Fats and oils Soft margarine without trans fats. Vegetable oil. Reduced-fat, low-fat, or light mayonnaise and salad  dressings (reduced-sodium). Canola, safflower, olive, avocado, soybean, and sunflower oils. Avocado. Seasonings and condiments Herbs. Spices. Seasoning mixes without salt. Other foods Unsalted popcorn and pretzels. Fat-free sweets. The items listed above may not be all the foods and drinks you can have. Talk to a dietitian to learn more. What foods should I avoid? Fruits Canned fruit in a light or heavy syrup. Fried fruit. Fruit in cream or butter sauce. Vegetables Creamed or fried vegetables. Vegetables in a cheese sauce. Regular canned vegetables that are not marked as low-sodium or reduced-sodium. Regular canned tomato sauce and paste that are not marked as low-sodium or reduced-sodium. Regular tomato and vegetable juices that are not marked as low-sodium or reduced-sodium. Dene. Olives. Grains Baked goods made with fat, such as croissants, muffins, or some breads. Dry pasta or rice meal packs. Meats and other proteins Fatty cuts of meat. Ribs. Fried meat. [place]. Bologna, salami, and other precooked or cured meats, such as sausages or meat loaves, that are not lean and low in sodium. Fat from the back of a pig (fatback). Bratwurst. Salted nuts and seeds. Canned beans with added salt. Canned  or smoked fish. Whole eggs or egg yolks. Chicken or [place] with skin. Dairy Whole or 2% milk, cream, and half-and-half. Whole or full-fat cream cheese. Whole-fat or sweetened yogurt. Full-fat cheese. Nondairy creamers. Whipped toppings. Processed cheese and cheese spreads. Fats and oils Butter. Stick margarine. Lard. Shortening. Ghee. Bacon fat. Tropical oils, such as coconut, palm kernel, or palm oil. Seasonings and condiments Onion salt, garlic salt, seasoned salt, table salt, and sea salt. Worcestershire sauce. Tartar sauce. Barbecue sauce. Teriyaki sauce. Soy sauce, including reduced-sodium soy sauce. Steak sauce. Canned and packaged gravies. Fish sauce. Oyster sauce. Cocktail sauce. Store-bought  horseradish. Ketchup. Mustard. Meat flavorings and tenderizers. Bouillon cubes. Hot sauces. Pre-made or packaged marinades. Pre-made or packaged taco seasonings. Relishes. Regular salad dressings. Other foods Salted popcorn and pretzels. The items listed above may not be all the foods and drinks you should avoid. Talk to a dietitian to learn more. Where to find more information National Heart, Lung, and Blood Institute (NHLBI): BuffaloDryCleaner.gl American Heart Association (AHA): heart.org Academy of Nutrition and Dietetics: eatright.org National Kidney Foundation (NKF): kidney.org This information is not intended to replace advice given to you by your health care provider. Make sure you discuss any questions you have with your health care provider. Document Revised: 02/17/2022 Document Reviewed: 02/17/2022 Elsevier Patient Education  2024 [employer].

## 2024-03-13 ENCOUNTER — Ambulatory Visit: Admitting: Nurse Practitioner

## 2024-03-13 ENCOUNTER — Encounter: Payer: Self-pay | Admitting: Nurse Practitioner

## 2024-03-13 VITALS — BP 140/80 | HR 63 | Temp 98.8°F | Ht 63.5 in | Wt 193.8 lb

## 2024-03-13 DIAGNOSIS — I10 Essential (primary) hypertension: Secondary | ICD-10-CM | POA: Diagnosis not present

## 2024-03-13 NOTE — Progress Notes (Deleted)
" ° °  BP (!) 164/106   Pulse 63   Temp 98.8 F (37.1 C) (Oral)   Ht 5' 3.5 (1.613 m)   Wt 193 lb 12.8 oz (87.9 kg)   SpO2 98%   BMI 33.79 kg/m    Subjective:    Patient ID: Jasmine [name], female    DOB: [birth year], 64 y.o.   MRN: [id number]  HPI: Jasmine [name] is a 64 y.o. female  Chief Complaint  Patient presents with   Hypertension   HYPERTENSION without Chronic Kidney Disease Taking Losartan  100 MG daily, which was changed at visit 02/14/24. Hypertension status: {Blank single:19197::controlled,uncontrolled,better,worse,exacerbated,stable}  Satisfied with current treatment? {Blank single:19197::yes,no} Duration of hypertension: {Blank single:19197::chronic,months,years} BP monitoring frequency:  {Blank single:19197::not checking,rarely,daily,weekly,monthly,a few times a day,a few times a week,a few times a month} BP range:  BP medication side effects:  {Blank single:19197::yes,no} Medication compliance: {Blank single:19197::excellent compliance,good compliance,fair compliance,poor compliance} Previous BP meds: Aspirin: {Blank single:19197::yes,no} Recurrent headaches: {Blank single:19197::yes,no} Visual changes: {Blank single:19197::yes,no} Palpitations: {Blank single:19197::yes,no} Dyspnea: {Blank single:19197::yes,no} Chest pain: {Blank single:19197::yes,no} Lower extremity edema: {Blank single:19197::yes,no} Dizzy/lightheaded: {Blank single:19197::yes,no}   Relevant past medical, surgical, family and social history reviewed and updated as indicated. Interim medical history since our last visit reviewed. Allergies and medications reviewed and updated.  Review of Systems  Per HPI unless specifically indicated above     Objective:    BP (!) 164/106   Pulse 63   Temp 98.8 F (37.1 C) (Oral)   Ht 5' 3.5 (1.613 m)   Wt 193 lb 12.8 oz (87.9 kg)   SpO2 98%   BMI 33.79 kg/m   Wt  Readings from Last 3 Encounters:  03/13/24 193 lb 12.8 oz (87.9 kg)  02/14/24 190 lb 6.4 oz (86.4 kg)  01/22/24 187 lb (84.8 kg)    Physical Exam  Results for orders placed or performed in visit on 02/14/24  Basic metabolic panel with GFR   Collection Time: 02/14/24 11:01 AM  Result Value Ref Range   Glucose 87 70 - 99 mg/dL   BUN 7 (L) 8 - 27 mg/dL   Creatinine, Ser 9.36 0.57 - 1.00 mg/dL   eGFR 899 >40 fO/fpw/8.26   BUN/Creatinine Ratio 11 (L) 12 - 28   Sodium 140 134 - 144 mmol/L   Potassium 4.6 3.5 - 5.2 mmol/L   Chloride 100 96 - 106 mmol/L   CO2 25 20 - 29 mmol/L   Calcium  9.1 8.7 - 10.3 mg/dL      Assessment & Plan:   Problem List Items Addressed This Visit       Cardiovascular and Mediastinum   Essential hypertension - Primary   Relevant Orders   Basic metabolic panel with GFR     Follow up plan: No follow-ups on file.      "

## 2024-03-13 NOTE — Assessment & Plan Note (Addendum)
 Chronic and improving. BP on this admission was 164/106 due anxiety, repeat was 140/80. Plan is to keep patient on current regiment, we will recheck after 4 weeks for reevaluation. Patient reported feeling great and enthusiastic about her current regiment. Check BMP today.

## 2024-03-13 NOTE — Progress Notes (Signed)
 "  BP (!) 140/80 (BP Location: Left Arm, Patient Position: Sitting, Cuff Size: Normal)   Pulse 63   Temp 98.8 F (37.1 C) (Oral)   Ht 5' 3.5 (1.613 m)   Wt 193 lb 12.8 oz (87.9 kg)   SpO2 98%   BMI 33.79 kg/m    Subjective:    Patient ID: Jasmine [name], female    DOB: [birth year], 64 y.o.   MRN: [id number]  HPI: Jasmine [name] is a 64 y.o. female  Chief Complaint  Patient presents with   Hypertension   NOTE WRITTEN BY DNP STUDENT.  ASSESSMENT AND PLAN OF CARE REVIEWED WITH STUDENT, AGREE WITH ABOVE FINDINGS AND PLAN.   HYPERTENSION without Chronic Kidney Disease Taking Losartan  100 MG daily, which was changed at visit 02/14/24. Hypertension status: pt is waiting for a new BP machine (last one was 116/72)  Satisfied with current treatment? yes Duration of hypertension: chronic BP monitoring frequency:  daily BP range: 116/72 this morning, occasional elevations  BP medication side effects:  no Medication compliance: excellent compliance Previous BP meds: Aspirin: no Recurrent headaches: no Visual changes: no Palpitations: no Dyspnea: no Chest pain: no Lower extremity edema: no Dizzy/lightheaded: no   Relevant past medical, surgical, family and social history reviewed and updated as indicated. Interim medical history since our last visit reviewed. Allergies and medications reviewed and updated.  Review of Systems  Constitutional:  Negative for activity change, appetite change, diaphoresis, fatigue and fever.  Respiratory:  Negative for cough, chest tightness, shortness of breath and wheezing.   Cardiovascular:  Negative for chest pain, palpitations and leg swelling.  Gastrointestinal: Negative.   Neurological: Negative.   Psychiatric/Behavioral: Negative.      Per HPI unless specifically indicated above     Objective:    BP (!) 140/80 (BP Location: Left Arm, Patient Position: Sitting, Cuff Size: Normal)   Pulse 63   Temp 98.8 F (37.1 C) (Oral)   Ht 5' 3.5  (1.613 m)   Wt 193 lb 12.8 oz (87.9 kg)   SpO2 98%   BMI 33.79 kg/m   Wt Readings from Last 3 Encounters:  03/13/24 193 lb 12.8 oz (87.9 kg)  02/14/24 190 lb 6.4 oz (86.4 kg)  01/22/24 187 lb (84.8 kg)    Physical Exam Vitals and nursing note reviewed.  Constitutional:      General: She is awake. She is not in acute distress.    Appearance: She is well-developed and well-groomed. She is obese. She is not ill-appearing or toxic-appearing.  HENT:     Head: Normocephalic.     Right Ear: Hearing and external ear normal.     Left Ear: Hearing and external ear normal.  Eyes:     General: Lids are normal.        Right eye: No discharge.        Left eye: No discharge.     Conjunctiva/sclera: Conjunctivae normal.     Pupils: Pupils are equal, round, and reactive to light.  Neck:     Thyroid : No thyromegaly.     Vascular: No carotid bruit.  Cardiovascular:     Rate and Rhythm: Normal rate and regular rhythm.     Heart sounds: Normal heart sounds. No murmur heard.    No gallop.  Pulmonary:     Effort: Pulmonary effort is normal. No accessory muscle usage or respiratory distress.     Breath sounds: Normal breath sounds. No decreased breath sounds, wheezing or rales.  Abdominal:  General: Bowel sounds are normal. There is no distension.     Palpations: Abdomen is soft.     Tenderness: There is no abdominal tenderness.  Musculoskeletal:     Cervical back: Normal range of motion and neck supple.     Right lower leg: No edema.     Left lower leg: No edema.  Lymphadenopathy:     Cervical: No cervical adenopathy.  Skin:    General: Skin is warm and dry.  Neurological:     Mental Status: She is alert and oriented to person, place, and time.     Deep Tendon Reflexes: Reflexes are normal and symmetric.     Reflex Scores:      Brachioradialis reflexes are 2+ on the right side and 2+ on the left side.      Patellar reflexes are 2+ on the right side and 2+ on the left  side. Psychiatric:        Attention and Perception: Attention normal.        Mood and Affect: Mood normal.        Speech: Speech normal.        Behavior: Behavior normal. Behavior is cooperative.        Thought Content: Thought content normal.     Results for orders placed or performed in visit on 02/14/24  Basic metabolic panel with GFR   Collection Time: 02/14/24 11:01 AM  Result Value Ref Range   Glucose 87 70 - 99 mg/dL   BUN 7 (L) 8 - 27 mg/dL   Creatinine, Ser 9.36 0.57 - 1.00 mg/dL   eGFR 899 >40 fO/fpw/8.26   BUN/Creatinine Ratio 11 (L) 12 - 28   Sodium 140 134 - 144 mmol/L   Potassium 4.6 3.5 - 5.2 mmol/L   Chloride 100 96 - 106 mmol/L   CO2 25 20 - 29 mmol/L   Calcium  9.1 8.7 - 10.3 mg/dL      Assessment & Plan:   Problem List Items Addressed This Visit       Cardiovascular and Mediastinum   Essential hypertension - Primary   Chronic and improving. BP on this admission was 164/106 due anxiety, repeat was 140/80. Plan is to keep patient on current regiment, we will recheck after 4 weeks for reevaluation. Patient reported feeling great and enthusiastic about her current regiment. Check BMP today.      Relevant Orders   Basic metabolic panel with GFR     Follow up plan: Return in about 4 weeks (around 04/10/2024) for HTN/HLD.      "

## 2024-03-14 ENCOUNTER — Ambulatory Visit: Payer: Self-pay | Admitting: Nurse Practitioner

## 2024-03-14 LAB — BASIC METABOLIC PANEL WITH GFR
BUN/Creatinine Ratio: 18 (ref 12–28)
BUN: 11 mg/dL (ref 8–27)
CO2: 23 mmol/L (ref 20–29)
Calcium: 9.5 mg/dL (ref 8.7–10.3)
Chloride: 102 mmol/L (ref 96–106)
Creatinine, Ser: 0.6 mg/dL (ref 0.57–1.00)
Glucose: 83 mg/dL (ref 70–99)
Potassium: 4.4 mmol/L (ref 3.5–5.2)
Sodium: 141 mmol/L (ref 134–144)
eGFR: 101 mL/min/{1.73_m2}

## 2024-03-14 NOTE — Progress Notes (Signed)
 Contacted via MyChart  Good morning Jasmine [name], labs have returned and remain stable. Great news!! Any questions? Keep being amazing!!  Thank you for allowing me to participate in your care.  I appreciate you. Kindest regards, Kariem Wolfson

## 2024-03-18 ENCOUNTER — Telehealth: Admitting: *Deleted

## 2024-03-20 ENCOUNTER — Other Ambulatory Visit: Payer: Self-pay

## 2024-03-20 NOTE — Patient Instructions (Signed)
 Visit Information  Thank you for taking time to visit with me today. Please don't hesitate to contact me if I can be of assistance to you before our next scheduled appointment.  Your next care management appointment is by telephone on 04/17/2024 at 11:00 am   Please call the care guide team at [phone] if you need to cancel, schedule, or reschedule an appointment.   Please call 911 if you are experiencing a Mental Health or Behavioral Health Crisis or need someone to talk to. [name] Ally, MSW, LCSW [place]  [facility], [facility] Licensed Clinical Social Worker Direct Dial: [phone]

## 2024-03-20 NOTE — Patient Outreach (Signed)
 Complex Care Management   Visit Note  03/20/2024  Name:  Jasmine [name] MRN: [id number] DOB: Aug 29, 1960  Situation: Referral received for Complex Care Management related to Mental/Behavioral Health diagnosis anxiety and depression. I obtained verbal consent from Patient.  Visit completed with Patient  on the phone. LCSW and patient discussed resources for patient's mental health needs. Patient is amenable to therapy and medications for mental health needs and has an appointment with [facility] on 04/15/2024.   Background:   Past Medical History:  Diagnosis Date   Allergy    Anemia    Anxiety    Arthritis    LEFT SHOULDER   Depression    H/O cerebral venous sinus thrombosis 2013   History of hiatal hernia    History of kidney stones    Occipital neuralgia of right side     Assessment: Patient Reported Symptoms:  Cognitive Cognitive Status: Normal speech and language skills, Alert and oriented to person, place, and time Cognitive/Intellectual Conditions Management [RPT]: None reported or documented in medical history or problem list   Health Maintenance Behaviors: Annual physical exam  Neurological Neurological Review of Symptoms: Not assessed Neurological Management Strategies: Routine screening  HEENT HEENT Symptoms Reported: Not assessed HEENT Management Strategies: Routine screening    Cardiovascular Cardiovascular Symptoms Reported: Not assessed    Respiratory Respiratory Symptoms Reported: Not assesed    Endocrine Endocrine Symptoms Reported: Not assessed    Gastrointestinal Gastrointestinal Symptoms Reported: Not assessed      Genitourinary Genitourinary Symptoms Reported: Not assessed    Integumentary Integumentary Symptoms Reported: Not assessed    Musculoskeletal Musculoskelatal Symptoms Reviewed: Not assessed        Psychosocial Psychosocial Symptoms Reported: Depression - if selected complete PHQ 2-9, Anxiety - if selected complete  GAD Additional Psychological Details: Patient reports that depression is about the same Behavioral Management Strategies: Coping strategies, Counseling (patient has an appointment with Psychiartrist) Major Change/Loss/Stressor/Fears (CP): Death of a loved one Behaviors When Feeling Stressed/Fearful: restless Quality of Family Relationships: supportive Do you feel physically threatened by others?: No    03/20/2024    PHQ2-9 Depression Screening   Little interest or pleasure in doing things    Feeling down, depressed, or hopeless    PHQ-2 - Total Score    Trouble falling or staying asleep, or sleeping too much    Feeling tired or having little energy    Poor appetite or overeating     Feeling bad about yourself - or that you are a failure or have let yourself or your family down    Trouble concentrating on things, such as reading the newspaper or watching television    Moving or speaking so slowly that other people could have noticed.  Or the opposite - being so fidgety or restless that you have been moving around a lot more than usual    Thoughts that you would be better off dead, or hurting yourself in some way    PHQ2-9 Total Score    If you checked off any problems, how difficult have these problems made it for you to do your work, take care of things at home, or get along with other people    Depression Interventions/Treatment      There were no vitals filed for this visit.    Medications Reviewed Today     Reviewed by Sherren [name] FORBES KEN (Social Worker) on 03/20/24 at 1103  Med List Status: <None>   Medication Order Taking? Sig Documenting  Provider Last Dose Status Informant  acetaminophen  (TYLENOL ) 500 MG tablet [id number] Yes Take 500 mg by mouth as needed. [provider]  Active   losartan  (COZAAR ) 100 MG tablet [id number] Yes Take 1 tablet (100 mg total) by mouth daily. Cannady, Jolene T, NP  Active   meclizine  (ANTIVERT ) 25 MG tablet [id number] Yes Take 0.5 tablets  (12.5 mg total) by mouth 3 (three) times daily as needed for dizziness. Melvin Pao, NP  Active   ondansetron  (ZOFRAN -ODT) 4 MG disintegrating tablet [id number] Yes Take 1 tablet (4 mg total) by mouth every 8 (eight) hours as needed for nausea or vomiting. Clarine Ozell LABOR, MD  Active   XARELTO  20 MG TABS tablet [id number] Yes TAKE 1 TABLET BY MOUTH AT BEDTIME Rao, Archana C, MD  Active             Recommendation:   Continue Current Plan of Care Patient will continue to utilize coping skills Patient will attend mental health provider meeting on 04/15/2024.  Follow Up Plan:   Telephone follow-up 04/17/2024 at 11:00 am  [name] Ally, MSW, LCSW [place]  [facility], [facility] Licensed Clinical Social Worker Direct Dial: [phone]

## 2024-03-25 ENCOUNTER — Telehealth

## 2024-04-12 ENCOUNTER — Ambulatory Visit: Admitting: Nurse Practitioner

## 2024-04-15 ENCOUNTER — Ambulatory Visit: Payer: Self-pay | Admitting: Psychiatry

## 2024-04-17 ENCOUNTER — Telehealth

## 2024-05-08 ENCOUNTER — Other Ambulatory Visit

## 2024-06-04 ENCOUNTER — Encounter: Admitting: Nurse Practitioner

## 2024-12-24 ENCOUNTER — Inpatient Hospital Stay

## 2024-12-24 ENCOUNTER — Inpatient Hospital Stay: Admitting: Oncology
